# Patient Record
Sex: Male | Born: 1980
Health system: Southern US, Community
[De-identification: ages and names within clinical notes are randomized; demographics above are authoritative.]

## PROBLEM LIST (undated history)

## (undated) DIAGNOSIS — K589 Irritable bowel syndrome without diarrhea: Secondary | ICD-10-CM

## (undated) DIAGNOSIS — E781 Pure hyperglyceridemia: Secondary | ICD-10-CM

## (undated) DIAGNOSIS — E782 Mixed hyperlipidemia: Secondary | ICD-10-CM

## (undated) DIAGNOSIS — R42 Dizziness and giddiness: Secondary | ICD-10-CM

## (undated) DIAGNOSIS — K219 Gastro-esophageal reflux disease without esophagitis: Secondary | ICD-10-CM

## (undated) DIAGNOSIS — Z91018 Allergy to other foods: Secondary | ICD-10-CM

## (undated) DIAGNOSIS — F319 Bipolar disorder, unspecified: Secondary | ICD-10-CM

## (undated) DIAGNOSIS — M545 Low back pain, unspecified: Secondary | ICD-10-CM

## (undated) DIAGNOSIS — F419 Anxiety disorder, unspecified: Secondary | ICD-10-CM

## (undated) DIAGNOSIS — F909 Attention-deficit hyperactivity disorder, unspecified type: Secondary | ICD-10-CM

## (undated) DIAGNOSIS — M5412 Radiculopathy, cervical region: Secondary | ICD-10-CM

## (undated) DIAGNOSIS — K760 Fatty (change of) liver, not elsewhere classified: Secondary | ICD-10-CM

## (undated) DIAGNOSIS — Z91040 Latex allergy status: Secondary | ICD-10-CM

## (undated) DIAGNOSIS — R251 Tremor, unspecified: Secondary | ICD-10-CM

## (undated) DIAGNOSIS — F329 Major depressive disorder, single episode, unspecified: Secondary | ICD-10-CM

## (undated) DIAGNOSIS — G4489 Other headache syndrome: Secondary | ICD-10-CM

## (undated) DIAGNOSIS — I10 Essential (primary) hypertension: Secondary | ICD-10-CM

## (undated) DIAGNOSIS — G5603 Carpal tunnel syndrome, bilateral upper limbs: Secondary | ICD-10-CM

## (undated) DIAGNOSIS — R51 Headache: Secondary | ICD-10-CM

## (undated) HISTORY — DX: Attention-deficit hyperactivity disorder, unspecified type: F90.9

## (undated) HISTORY — DX: Bipolar disorder, unspecified: F31.9

## (undated) HISTORY — DX: Latex allergy status: Z91.040

## (undated) HISTORY — PX: WISDOM TOOTH EXTRACTION: SHX21

## (undated) HISTORY — DX: Other headache syndrome: G44.89

## (undated) HISTORY — DX: Allergy to other foods: Z91.018

## (undated) HISTORY — DX: Fatty (change of) liver, not elsewhere classified: K76.0

## (undated) HISTORY — DX: Mixed hyperlipidemia: E78.2

## (undated) HISTORY — DX: Low back pain: M54.5

## (undated) HISTORY — DX: Tremor, unspecified: R25.1

## (undated) HISTORY — DX: Anxiety disorder, unspecified: F41.9

## (undated) HISTORY — DX: Dizziness and giddiness: R42

## (undated) HISTORY — DX: Gastro-esophageal reflux disease without esophagitis: K21.9

## (undated) HISTORY — PX: CARPAL TUNNEL RELEASE: SHX101

## (undated) HISTORY — DX: Carpal tunnel syndrome, bilateral upper limbs: G56.03

## (undated) HISTORY — DX: Low back pain, unspecified: M54.50

## (undated) HISTORY — DX: Irritable bowel syndrome, unspecified: K58.9

## (undated) HISTORY — PX: KNEE SURGERY: SHX244

## (undated) HISTORY — DX: Pure hyperglyceridemia: E78.1

---

## 2001-09-22 ENCOUNTER — Ambulatory Visit (HOSPITAL_COMMUNITY): Admission: RE | Admit: 2001-09-22 | Discharge: 2001-09-22 | Payer: Self-pay | Admitting: Pulmonary Disease

## 2002-10-01 ENCOUNTER — Other Ambulatory Visit: Admission: RE | Admit: 2002-10-01 | Discharge: 2002-10-01 | Payer: Self-pay | Admitting: Unknown Physician Specialty

## 2003-03-01 ENCOUNTER — Emergency Department (HOSPITAL_COMMUNITY): Admission: EM | Admit: 2003-03-01 | Discharge: 2003-03-01 | Payer: Self-pay | Admitting: Emergency Medicine

## 2003-03-22 ENCOUNTER — Emergency Department (HOSPITAL_COMMUNITY): Admission: EM | Admit: 2003-03-22 | Discharge: 2003-03-22 | Payer: Self-pay | Admitting: Family Medicine

## 2003-04-06 ENCOUNTER — Emergency Department (HOSPITAL_COMMUNITY): Admission: EM | Admit: 2003-04-06 | Discharge: 2003-04-06 | Payer: Self-pay | Admitting: Emergency Medicine

## 2003-04-12 ENCOUNTER — Emergency Department (HOSPITAL_COMMUNITY): Admission: EM | Admit: 2003-04-12 | Discharge: 2003-04-12 | Payer: Self-pay | Admitting: Emergency Medicine

## 2004-04-16 ENCOUNTER — Emergency Department (HOSPITAL_COMMUNITY): Admission: EM | Admit: 2004-04-16 | Discharge: 2004-04-16 | Payer: Self-pay | Admitting: Emergency Medicine

## 2005-01-25 ENCOUNTER — Emergency Department (HOSPITAL_COMMUNITY): Admission: EM | Admit: 2005-01-25 | Discharge: 2005-01-26 | Payer: Self-pay | Admitting: Emergency Medicine

## 2006-12-05 ENCOUNTER — Emergency Department (HOSPITAL_COMMUNITY): Admission: EM | Admit: 2006-12-05 | Discharge: 2006-12-05 | Payer: Self-pay | Admitting: Emergency Medicine

## 2006-12-22 ENCOUNTER — Emergency Department (HOSPITAL_COMMUNITY): Admission: EM | Admit: 2006-12-22 | Discharge: 2006-12-22 | Payer: Self-pay | Admitting: Emergency Medicine

## 2007-01-02 ENCOUNTER — Emergency Department (HOSPITAL_COMMUNITY): Admission: EM | Admit: 2007-01-02 | Discharge: 2007-01-02 | Payer: Self-pay | Admitting: Emergency Medicine

## 2007-01-26 ENCOUNTER — Emergency Department (HOSPITAL_COMMUNITY): Admission: EM | Admit: 2007-01-26 | Discharge: 2007-01-26 | Payer: Self-pay | Admitting: Emergency Medicine

## 2007-03-09 ENCOUNTER — Emergency Department (HOSPITAL_COMMUNITY): Admission: EM | Admit: 2007-03-09 | Discharge: 2007-03-09 | Payer: Self-pay | Admitting: Emergency Medicine

## 2007-03-18 ENCOUNTER — Ambulatory Visit (HOSPITAL_COMMUNITY): Admission: RE | Admit: 2007-03-18 | Discharge: 2007-03-18 | Payer: Self-pay | Admitting: Orthopaedic Surgery

## 2007-05-10 ENCOUNTER — Emergency Department (HOSPITAL_COMMUNITY): Admission: EM | Admit: 2007-05-10 | Discharge: 2007-05-10 | Payer: Self-pay | Admitting: Emergency Medicine

## 2010-11-07 LAB — URINALYSIS, ROUTINE W REFLEX MICROSCOPIC: Urobilinogen, UA: 0.2

## 2013-05-13 ENCOUNTER — Emergency Department (HOSPITAL_COMMUNITY)
Admission: EM | Admit: 2013-05-13 | Discharge: 2013-05-14 | Disposition: A | Discharge status: Home / Self Care | Payer: Self-pay | Attending: Emergency Medicine | Admitting: Emergency Medicine

## 2013-05-13 ENCOUNTER — Encounter (HOSPITAL_COMMUNITY): Payer: Self-pay | Admitting: Emergency Medicine

## 2013-05-13 DIAGNOSIS — N453 Epididymo-orchitis: Secondary | ICD-10-CM | POA: Insufficient documentation

## 2013-05-13 DIAGNOSIS — Z87440 Personal history of urinary (tract) infections: Secondary | ICD-10-CM | POA: Insufficient documentation

## 2013-05-13 DIAGNOSIS — N451 Epididymitis: Secondary | ICD-10-CM

## 2013-05-13 DIAGNOSIS — Z8619 Personal history of other infectious and parasitic diseases: Secondary | ICD-10-CM | POA: Insufficient documentation

## 2013-05-13 DIAGNOSIS — I10 Essential (primary) hypertension: Secondary | ICD-10-CM | POA: Insufficient documentation

## 2013-05-13 HISTORY — DX: Essential (primary) hypertension: I10

## 2013-05-13 NOTE — ED Provider Notes (Signed)
CSN: [id number]     Arrival date & time 05/13/13  2020 History  This chart was scribed for Vida RollerBrian D Teiara Baria, MD by Danella Maiersaroline Early, ED Scribe. This patient was seen in room APA04/APA04 and the patient's care was started at 11:26 PM.   Chief Complaint  Patient presents with  . Groin Pain   The history is provided by the patient. No language interpreter was used.   HPI Comments: Bobby Jimenez is a 33 y.o. male who presents to the Emergency Department complaining of constant groin pain onset 5 days ago. He states the pain is worse with palpation. He states the pain radiates into his abdomen and back. He reports throbbing pain in his penis with urination. He has a h/o 2 UTIs. He has a h/o STD but does not know which one. He is not having unprotected sex.    Past Medical History  Diagnosis Date  . Hypertension    Past Surgical History  Procedure Laterality Date  . Wisdom tooth extraction    . Knee surgery     History reviewed. No pertinent family history. History  Substance Use Topics  . Smoking status: Never Smoker   . Smokeless tobacco: Not on file  . Alcohol Use: Yes    Review of Systems  Constitutional: Negative for fever and chills.  Genitourinary: Positive for dysuria.  Skin: Negative for rash.  Neurological: Negative for numbness.  All other systems reviewed and are negative.     Allergies  Review of patient's allergies indicates no known allergies.  Home Medications   Prior to Admission medications   Not on File   BP 135/83  Pulse 101  Temp(Src) 98.2 F (36.8 C) (Oral)  Resp 18  Ht 5\' 10"  (1.778 m)  Wt 225 lb (102.059 kg)  BMI 32.28 kg/m2  SpO2 100% Physical Exam  Nursing note and vitals reviewed. Constitutional: He appears well-developed and well-nourished. No distress.  HENT:  Head: Normocephalic and atraumatic.  Eyes: Conjunctivae are normal. Right eye exhibits no discharge. Left eye exhibits no discharge.  Cardiovascular: Normal rate and regular  rhythm.   No murmur heard. Pulmonary/Chest: Effort normal and breath sounds normal.  Abdominal: Soft. He exhibits no distension. There is no tenderness. There is no rebound and no guarding.  Genitourinary:  nontender prostate normal rectal exam. Chaperone present.  Normal appearing scrotum, L testicle with posterior ttp, no masses  Musculoskeletal: He exhibits no edema and no tenderness.  Lymphadenopathy:    He has no cervical adenopathy.  Neurological: He is alert. Coordination normal.  Skin: Skin is warm. No rash noted.    ED Course  Procedures (including critical care time) Medications  cefTRIAXone (ROCEPHIN) injection 250 mg (not administered)    DIAGNOSTIC STUDIES: Oxygen Saturation is 100% on RA, normal by my interpretation.    COORDINATION OF CARE: 11:42 PM- Discussed treatment plan with pt which includes STD swab. Pt agrees to plan.    Labs Review Labs Reviewed  GC/CHLAMYDIA PROBE AMP  URINALYSIS, ROUTINE W REFLEX MICROSCOPIC  RPR  HIV ANTIBODY (ROUTINE TESTING)    Imaging Review No results found.    MDM   Final diagnoses:  Epididymitis    Exam is consistent with possible epididymitis, no signs of prostatitis, urinalysis without infection, GC chlamydia pending, patient informed of treatment plan and is in agreement. Will followup outpatient, medications as below   Meds given in ED:  Medications  cefTRIAXone (ROCEPHIN) injection 250 mg (not administered)    New Prescriptions  DOXYCYCLINE (VIBRAMYCIN) 100 MG CAPSULE    Take 1 capsule (100 mg total) by mouth 2 (two) times daily.   NAPROXEN (NAPROSYN) 500 MG TABLET    Take 1 tablet (500 mg total) by mouth 2 (two) times daily with a meal.      I personally performed the services described in this documentation, which was scribed in my presence. The recorded information has been reviewed and is accurate.      Vida RollerBrian D Anneth Brunell, MD 05/14/13 [phone]

## 2013-05-13 NOTE — ED Notes (Addendum)
Pt c/o groin pain (there is a "spot" on his testicle). Pt states he has an infected hair underneath testicle. Pt states the pain is radiating down his knees and into his feet. Pt also c/o back pain.

## 2013-05-14 LAB — URINALYSIS, ROUTINE W REFLEX MICROSCOPIC
Bilirubin Urine: NEGATIVE
GLUCOSE, UA: NEGATIVE mg/dL
HGB URINE DIPSTICK: NEGATIVE
Ketones, ur: NEGATIVE mg/dL
Leukocytes, UA: NEGATIVE
Nitrite: NEGATIVE
PROTEIN: NEGATIVE mg/dL
SPECIFIC GRAVITY, URINE: 1.015 (ref 1.005–1.030)
UROBILINOGEN UA: 0.2 mg/dL (ref 0.0–1.0)
pH: 6 (ref 5.0–8.0)

## 2013-05-14 LAB — RPR

## 2013-05-14 LAB — HIV ANTIBODY (ROUTINE TESTING W REFLEX): HIV 1&2 Ab, 4th Generation: NONREACTIVE

## 2013-05-14 MED ORDER — CEFTRIAXONE SODIUM 250 MG IJ SOLR
250.0000 mg | Freq: Once | INTRAMUSCULAR | Status: AC
  Administered 2013-05-14: 250 mg via INTRAMUSCULAR
  Filled 2013-05-14: qty 250

## 2013-05-14 MED ORDER — LIDOCAINE HCL (PF) 1 % IJ SOLN
INTRAMUSCULAR | Status: DC
Start: 2013-05-14 — End: 2013-05-14
  Filled 2013-05-14: qty 5

## 2013-05-14 MED ORDER — NAPROXEN 500 MG PO TABS: 500.0000 mg | ORAL_TABLET | Freq: Two times a day (BID) | ORAL | Status: DC

## 2013-05-14 MED ORDER — DOXYCYCLINE HYCLATE 100 MG PO CAPS: 100.0000 mg | ORAL_CAPSULE | Freq: Two times a day (BID) | ORAL | Status: DC

## 2013-05-14 NOTE — ED Notes (Signed)
Discharge instructions and prescriptions given and reviewed with patient.  Patient verbalized understanding to complete all antibiotic and take medications as directed.  Patient ambulatory; discharged home in good condition.

## 2013-05-14 NOTE — Discharge Instructions (Signed)
If your tests turn positive for STD, you will receive a phone call - you likely have epididymitis - read attached instructions, take medicine exactly as prescribed, return to the ER for worsening symptoms.  Please call your doctor for a followup appointment within 24-48 hours. When you talk to your doctor please let them know that you were seen in the emergency department and have them acquire all of your records so that they can discuss the findings with you and formulate a treatment plan to fully care for your new and ongoing problems.  [facility]    Kari BaarsEdward Hawkins MD. Specialty: Pulmonary Disease Contact information: [address]  [address]  [place] [address]  [phone]   Syliva OvermanMargaret Simpson, MD. Specialty: [facility] Contact information: [address]  [place] [address]  [phone]   Lilyan PuntScott Luking, MD. Specialty: [facility] Contact information: [address]  [address]  [place] [address]  [phone]   Avon Gullyesfaye Fanta, MD Specialty: Internal Medicine Contact information: [address]  [place] [address]  [phone]   Catalina PizzaZach Hall, MD. Specialty: Internal Medicine Contact information: [address]  [place] [address]  [phone]   Butch PennyAngus Mcinnis, MD. Specialty: Family Medicine Contact information: [address]  [place] [address]  [phone]   John GiovanniStephen Knowlton, MD. Specialty: [facility] Contact information: [address]  [address]  [place] [address]  [phone]   Carylon Perchesoy Fagan, MD. Specialty: Internal Medicine Contact information: [address]  [address]  [place] [address]  [phone]

## 2013-05-15 LAB — GC/CHLAMYDIA PROBE AMP
CT Probe RNA: NEGATIVE
GC Probe RNA: NEGATIVE

## 2013-07-28 ENCOUNTER — Emergency Department (HOSPITAL_COMMUNITY)
Admission: EM | Admit: 2013-07-28 | Discharge: 2013-07-28 | Disposition: A | Discharge status: Home / Self Care | Payer: Self-pay | Attending: Emergency Medicine | Admitting: Emergency Medicine

## 2013-07-28 ENCOUNTER — Encounter (HOSPITAL_COMMUNITY): Payer: Self-pay | Admitting: Emergency Medicine

## 2013-07-28 DIAGNOSIS — Z79899 Other long term (current) drug therapy: Secondary | ICD-10-CM | POA: Insufficient documentation

## 2013-07-28 DIAGNOSIS — M5412 Radiculopathy, cervical region: Secondary | ICD-10-CM | POA: Insufficient documentation

## 2013-07-28 DIAGNOSIS — Z791 Long term (current) use of non-steroidal anti-inflammatories (NSAID): Secondary | ICD-10-CM | POA: Insufficient documentation

## 2013-07-28 DIAGNOSIS — I10 Essential (primary) hypertension: Secondary | ICD-10-CM | POA: Insufficient documentation

## 2013-07-28 DIAGNOSIS — Z792 Long term (current) use of antibiotics: Secondary | ICD-10-CM | POA: Insufficient documentation

## 2013-07-28 DIAGNOSIS — F17200 Nicotine dependence, unspecified, uncomplicated: Secondary | ICD-10-CM | POA: Insufficient documentation

## 2013-07-28 MED ORDER — OXYCODONE-ACETAMINOPHEN 5-325 MG PO TABS: 1.0000 | ORAL_TABLET | ORAL | Status: DC | PRN

## 2013-07-28 MED ORDER — OXYCODONE-ACETAMINOPHEN 5-325 MG PO TABS
2.0000 | ORAL_TABLET | Freq: Once | ORAL | Status: AC
  Administered 2013-07-28: 2 via ORAL
  Filled 2013-07-28: qty 2

## 2013-07-28 MED ORDER — IBUPROFEN 600 MG PO TABS: 600.0000 mg | ORAL_TABLET | Freq: Four times a day (QID) | ORAL | Status: DC | PRN

## 2013-07-28 MED ORDER — IBUPROFEN 400 MG PO TABS
600.0000 mg | ORAL_TABLET | Freq: Once | ORAL | Status: AC
  Administered 2013-07-28: 600 mg via ORAL
  Filled 2013-07-28: qty 2

## 2013-07-28 MED ORDER — PREDNISONE 20 MG PO TABS: 40.0000 mg | ORAL_TABLET | Freq: Every day | ORAL | Status: DC

## 2013-07-28 MED ORDER — PREDNISONE 20 MG PO TABS
40.0000 mg | ORAL_TABLET | Freq: Once | ORAL | Status: AC
  Administered 2013-07-28: 40 mg via ORAL
  Filled 2013-07-28: qty 2

## 2013-07-28 MED ORDER — CYCLOBENZAPRINE HCL 10 MG PO TABS: 10.0000 mg | ORAL_TABLET | Freq: Three times a day (TID) | ORAL | Status: DC | PRN

## 2013-07-28 NOTE — ED Notes (Signed)
Pt reporting pain in left shoulder starting yesterday.  Reports decreased ability to move left arm.  Pt states that he has had this problem before but has never been treated for it.

## 2013-07-28 NOTE — Discharge Instructions (Signed)

## 2013-07-28 NOTE — ED Provider Notes (Signed)
CSN: [id number]     Arrival date & time 07/28/13  0526 History   First MD Initiated Contact with Patient 07/28/13 [phone]     Chief Complaint  Patient presents with  . Shoulder Pain     (Consider location/radiation/quality/duration/timing/severity/associated sxs/prior Treatment) HPI  33 year old male with left shoulder pain. Onset yesterday and simply increase when he woke up today. Denies any acute trauma. Does do manual labor. No numbness or tingling. Pain is worsened with range of motion of his left shoulder. No fevers or chills. No rash. No loss of strength. Has had similar pain previously, but not quite to this degree. No intervention prior to arrival.  Past Medical History  Diagnosis Date  . Hypertension    Past Surgical History  Procedure Laterality Date  . Wisdom tooth extraction    . Knee surgery     History reviewed. No pertinent family history. History  Substance Use Topics  . Smoking status: Current Some Day Smoker  . Smokeless tobacco: Not on file  . Alcohol Use: Yes     Comment: occasional    Review of Systems  All systems reviewed and negative, other than as noted in HPI.   Allergies  Review of patient's allergies indicates no known allergies.  Home Medications   Prior to Admission medications   Medication Sig Start Date End Date Taking? Authorizing Provider  busPIRone (BUSPAR) 5 MG tablet Take 5 mg by mouth 2 (two) times daily.    Historical Provider, MD  doxycycline (VIBRAMYCIN) 100 MG capsule Take 1 capsule (100 mg total) by mouth 2 (two) times daily. 05/14/13   Vida RollerBrian D Miller, MD  lisinopril (PRINIVIL,ZESTRIL) 10 MG tablet Take 10 mg by mouth daily.    Historical Provider, MD  Multiple Vitamin (MULTIVITAMIN) tablet Take 1 tablet by mouth daily.    Historical Provider, MD  naproxen (NAPROSYN) 500 MG tablet Take 1 tablet (500 mg total) by mouth 2 (two) times daily with a meal. 05/14/13   Vida RollerBrian D Miller, MD  ranitidine (ZANTAC) 150 MG capsule Take 150 mg by  mouth daily.    Historical Provider, MD  saw palmetto 500 MG capsule Take 450 mg by mouth daily.    Historical Provider, MD   BP 124/84  Pulse 95  Temp(Src) 98.3 F (36.8 C) (Oral)  Resp 20  Ht 5\' 10"  (1.778 m)  Wt 225 lb (102.059 kg)  BMI 32.28 kg/m2  SpO2 97% Physical Exam  Nursing note and vitals reviewed. Constitutional: He appears well-developed and well-nourished. No distress.  HENT:  Head: Normocephalic and atraumatic.  Eyes: Conjunctivae are normal. Right eye exhibits no discharge. Left eye exhibits no discharge.  Neck: Neck supple.  Cardiovascular: Normal rate, regular rhythm and normal heart sounds.  Exam reveals no gallop and no friction rub.   No murmur heard. Pulmonary/Chest: Effort normal and breath sounds normal. No respiratory distress.  Abdominal: Soft. He exhibits no distension. There is no tenderness.  Musculoskeletal: He exhibits no edema and no tenderness.  Mild tenderness over left lateral neck/left trapezius. No concerning skin changes. Increased pain with neck and shoulder movement. Neurovascular intact distally.  Neurological: He is alert.  Skin: Skin is warm and dry.  Psychiatric: He has a normal mood and affect. His behavior is normal. Thought content normal.    ED Course  Procedures (including critical care time) Labs Review Labs Reviewed - No data to display  Imaging Review No results found.   EKG Interpretation None      MDM  Final diagnoses:  Cervical radiculopathy    33 year old male with pain consistent with a cervical radiculopathy. Strength is normal. Sensation is intact to light touch. Palpable DP pulse. I plan symptomatic treatment.    Raeford RazorStephen Kohut, MD 07/29/13 1229

## 2013-07-29 ENCOUNTER — Ambulatory Visit (HOSPITAL_COMMUNITY)
Admission: RE | Admit: 2013-07-29 | Discharge: 2013-07-29 | Disposition: A | Discharge status: Home / Self Care | Payer: Self-pay | Source: Ambulatory Visit | Attending: Pulmonary Disease | Admitting: Pulmonary Disease

## 2013-07-29 ENCOUNTER — Other Ambulatory Visit (HOSPITAL_COMMUNITY): Payer: Self-pay | Admitting: Pulmonary Disease

## 2013-07-29 DIAGNOSIS — M542 Cervicalgia: Secondary | ICD-10-CM

## 2013-07-29 DIAGNOSIS — M79609 Pain in unspecified limb: Secondary | ICD-10-CM | POA: Insufficient documentation

## 2013-11-16 ENCOUNTER — Encounter (HOSPITAL_COMMUNITY): Payer: Self-pay | Admitting: Emergency Medicine

## 2013-11-16 ENCOUNTER — Emergency Department (HOSPITAL_COMMUNITY): Payer: Self-pay

## 2013-11-16 ENCOUNTER — Emergency Department (HOSPITAL_COMMUNITY)
Admission: EM | Admit: 2013-11-16 | Discharge: 2013-11-16 | Disposition: A | Discharge status: Home / Self Care | Payer: Self-pay | Attending: Emergency Medicine | Admitting: Emergency Medicine

## 2013-11-16 DIAGNOSIS — I10 Essential (primary) hypertension: Secondary | ICD-10-CM | POA: Insufficient documentation

## 2013-11-16 DIAGNOSIS — Z87891 Personal history of nicotine dependence: Secondary | ICD-10-CM | POA: Insufficient documentation

## 2013-11-16 DIAGNOSIS — Z79899 Other long term (current) drug therapy: Secondary | ICD-10-CM | POA: Insufficient documentation

## 2013-11-16 DIAGNOSIS — F329 Major depressive disorder, single episode, unspecified: Secondary | ICD-10-CM | POA: Insufficient documentation

## 2013-11-16 DIAGNOSIS — R1084 Generalized abdominal pain: Secondary | ICD-10-CM | POA: Insufficient documentation

## 2013-11-16 HISTORY — DX: Major depressive disorder, single episode, unspecified: F32.9

## 2013-11-16 LAB — CBC WITH DIFFERENTIAL/PLATELET
Basophils Absolute: 0 10*3/uL (ref 0.0–0.1)
Basophils Relative: 0 % (ref 0–1)
EOS ABS: 0.1 10*3/uL (ref 0.0–0.7)
Eosinophils Relative: 1 % (ref 0–5)
HCT: 42.1 % (ref 39.0–52.0)
Hemoglobin: 14.6 g/dL (ref 13.0–17.0)
LYMPHS ABS: 2 10*3/uL (ref 0.7–4.0)
Lymphocytes Relative: 21 % (ref 12–46)
MCH: 30.4 pg (ref 26.0–34.0)
MCHC: 34.7 g/dL (ref 30.0–36.0)
MCV: 87.5 fL (ref 78.0–100.0)
MONOS PCT: 7 % (ref 3–12)
Monocytes Absolute: 0.6 10*3/uL (ref 0.1–1.0)
NEUTROS ABS: 6.7 10*3/uL (ref 1.7–7.7)
NEUTROS PCT: 71 % (ref 43–77)
Platelets: 294 10*3/uL (ref 150–400)
RBC: 4.81 MIL/uL (ref 4.22–5.81)
RDW: 13 % (ref 11.5–15.5)
WBC: 9.3 10*3/uL (ref 4.0–10.5)

## 2013-11-16 LAB — URINALYSIS, ROUTINE W REFLEX MICROSCOPIC
BILIRUBIN URINE: NEGATIVE
GLUCOSE, UA: NEGATIVE mg/dL
HGB URINE DIPSTICK: NEGATIVE
Ketones, ur: NEGATIVE mg/dL
LEUKOCYTES UA: NEGATIVE
Nitrite: NEGATIVE
PROTEIN: NEGATIVE mg/dL
Specific Gravity, Urine: 1.025 (ref 1.005–1.030)
UROBILINOGEN UA: 0.2 mg/dL (ref 0.0–1.0)
pH: 6.5 (ref 5.0–8.0)

## 2013-11-16 LAB — BASIC METABOLIC PANEL
ANION GAP: 13 (ref 5–15)
BUN: 9 mg/dL (ref 6–23)
CALCIUM: 9.7 mg/dL (ref 8.4–10.5)
CHLORIDE: 101 meq/L (ref 96–112)
CO2: 26 mEq/L (ref 19–32)
CREATININE: 0.77 mg/dL (ref 0.50–1.35)
GFR calc Af Amer: >90 mL/min (ref >90)
GFR calc non Af Amer: >90 mL/min (ref >90)
Glucose, Bld: 110 mg/dL — ABNORMAL HIGH (ref 70–99)
Potassium: 4 mEq/L (ref 3.7–5.3)
SODIUM: 140 meq/L (ref 137–147)

## 2013-11-16 MED ORDER — IOHEXOL 300 MG/ML  SOLN
25.0000 mL | Freq: Once | INTRAMUSCULAR | Status: AC | PRN
  Administered 2013-11-16: 25 mL via ORAL

## 2013-11-16 MED ORDER — ONDANSETRON 4 MG PO TBDP
4.0000 mg | ORAL_TABLET | Freq: Once | ORAL | Status: AC
  Administered 2013-11-16: 4 mg via ORAL
  Filled 2013-11-16: qty 1

## 2013-11-16 MED ORDER — TRAMADOL HCL 50 MG PO TABS
50.0000 mg | ORAL_TABLET | Freq: Four times a day (QID) | ORAL | Status: DC | PRN
Start: 2013-11-16 — End: 2013-12-23

## 2013-11-16 MED ORDER — HYDROCODONE-ACETAMINOPHEN 5-325 MG PO TABS
2.0000 | ORAL_TABLET | Freq: Once | ORAL | Status: AC
  Administered 2013-11-16: 2 via ORAL
  Filled 2013-11-16: qty 2

## 2013-11-16 MED ORDER — IOHEXOL 300 MG/ML  SOLN
100.0000 mL | Freq: Once | INTRAMUSCULAR | Status: AC | PRN
  Administered 2013-11-16: 100 mL via INTRAVENOUS

## 2013-11-16 NOTE — ED Notes (Signed)
Patient complaining of abdominal pain x 3 days with vomiting.

## 2013-11-16 NOTE — ED Notes (Signed)
Pt alert & oriented x4, stable gait. Patient given discharge instructions, paperwork & prescription(s). Patient  instructed to stop at the registration desk to finish any additional paperwork. Patient verbalized understanding. Pt left department w/ no further questions. 

## 2013-11-16 NOTE — Discharge Instructions (Signed)
Your tests including your CT scan and blood work were normal - no signs of urine infection  Please call your doctor for a followup appointment within 24-48 hours. When you talk to your doctor please let them know that you were seen in the emergency department and have them acquire all of your records so that they can discuss the findings with you and formulate a treatment plan to fully care for your new and ongoing problems.   Abdominal Pain Many things can cause abdominal pain. Usually, abdominal pain is not caused by a disease and will improve without treatment. It can often be observed and treated at home. Your health care provider will do a physical exam and possibly order blood tests and X-rays to help determine the seriousness of your pain. However, in many cases, more time must pass before a clear cause of the pain can be found. Before that point, your health care provider may not know if you need more testing or further treatment. HOME CARE INSTRUCTIONS  Monitor your abdominal pain for any changes. The following actions may help to alleviate any discomfort you are experiencing:  Only take over-the-counter or prescription medicines as directed by your health care provider.  Do not take laxatives unless directed to do so by your health care provider.  Try a clear liquid diet (broth, tea, or water) as directed by your health care provider. Slowly move to a bland diet as tolerated. SEEK MEDICAL CARE IF:  You have unexplained abdominal pain.  You have abdominal pain associated with nausea or diarrhea.  You have pain when you urinate or have a bowel movement.  You experience abdominal pain that wakes you in the night.  You have abdominal pain that is worsened or improved by eating food.  You have abdominal pain that is worsened with eating fatty foods.  You have a fever. SEEK IMMEDIATE MEDICAL CARE IF:   Your pain does not go away within 2 hours.  You keep throwing up  (vomiting).  Your pain is felt only in portions of the abdomen, such as the right side or the left lower portion of the abdomen.  You pass bloody or black tarry stools. MAKE SURE YOU:  Understand these instructions.   Will watch your condition.   Will get help right away if you are not doing well or get worse.  Document Released: 10/25/2004 Document Revised: 01/20/2013 Document Reviewed: 09/24/2012 [facility] Patient Information 2015 [place] [place]. This information is not intended to replace advice given to you by your health care provider. Make sure you discuss any questions you have with your health care provider.

## 2013-11-16 NOTE — ED Provider Notes (Signed)
CSN: [id number]     Arrival date & time 11/16/13  1539 History   First MD Initiated Contact with Patient 11/16/13 1905     Chief Complaint  Patient presents with  . Abdominal Pain     (Consider location/radiation/quality/duration/timing/severity/associated sxs/prior Treatment) HPI Comments: The patient is a 33 year old male with a history of high blood pressure and depression who complains of abdominal pain started 3 days ago. There has been some associated vomiting and some significant dysuria. He states that he has had no problems with his bowel movements including no constipation, diarrhea or rectal bleeding. He does endorse having difficulty urinating including maintaining a stream, states that he is unable to empty his bladder, in causes pain when he urinates and his urine has been dark yellow. He denies any back pain, chest pain, shortness of breath, fevers, chills, swelling, rashes, numbness, weakness. No medications given prior to arrival.  Patient is a 33 y.o. male presenting with abdominal pain. The history is provided by the patient.  Abdominal Pain   Past Medical History  Diagnosis Date  . Hypertension   . Depression    Past Surgical History  Procedure Laterality Date  . Wisdom tooth extraction    . Knee surgery     History reviewed. No pertinent family history. History  Substance Use Topics  . Smoking status: Former Games developermoker  . Smokeless tobacco: Not on file  . Alcohol Use: Yes     Comment: occasional    Review of Systems  Gastrointestinal: Positive for abdominal pain.  All other systems reviewed and are negative.     Allergies  Review of patient's allergies indicates no known allergies.  Home Medications   Prior to Admission medications   Medication Sig Start Date End Date Taking? Authorizing Provider  lisinopril (PRINIVIL,ZESTRIL) 10 MG tablet Take 10 mg by mouth daily.   Yes Historical Provider, MD  ranitidine (ZANTAC) 150 MG capsule Take 150 mg by  mouth daily.   Yes Historical Provider, MD  sertraline (ZOLOFT) 50 MG tablet Take 50 mg by mouth daily.   Yes Historical Provider, MD  traMADol (ULTRAM) 50 MG tablet Take 1 tablet (50 mg total) by mouth every 6 (six) hours as needed. 11/16/13   Vida RollerBrian D Sayyid Harewood, MD   BP 133/88  Pulse 110  Temp(Src) 98.9 F (37.2 C) (Oral)  Resp 20  Ht 5\' 10"  (1.778 m)  Wt 221 lb (100.245 kg)  BMI 31.71 kg/m2  SpO2 98% Physical Exam  Nursing note and vitals reviewed. Constitutional: He appears well-developed and well-nourished. No distress.  HENT:  Head: Normocephalic and atraumatic.  Mouth/Throat: Oropharynx is clear and moist. No oropharyngeal exudate.  Eyes: Conjunctivae and EOM are normal. Pupils are equal, round, and reactive to light. Right eye exhibits no discharge. Left eye exhibits no discharge. No scleral icterus.  Neck: Normal range of motion. Neck supple. No JVD present. No thyromegaly present.  Cardiovascular: Normal rate, regular rhythm, normal heart sounds and intact distal pulses.  Exam reveals no gallop and no friction rub.   No murmur heard. Pulmonary/Chest: Effort normal and breath sounds normal. No respiratory distress. He has no wheezes. He has no rales.  Abdominal: Soft. Bowel sounds are normal. He exhibits no distension and no mass. There is tenderness ( Diffuse abdominal tenderness to palpation).  No guarding, no peritoneal signs, very soft, normal bowel sounds  Musculoskeletal: Normal range of motion. He exhibits no edema and no tenderness.  Lymphadenopathy:    He has no cervical adenopathy.  Neurological: He is alert. Coordination normal.  Skin: Skin is warm and dry. No rash noted. No erythema.  Psychiatric: He has a normal mood and affect. His behavior is normal.    ED Course  Procedures (including critical care time) Labs Review Labs Reviewed  BASIC METABOLIC PANEL - Abnormal; Notable for the following:    Glucose, Bld 110 (*)    All other components within normal  limits  CBC WITH DIFFERENTIAL  URINALYSIS, ROUTINE W REFLEX MICROSCOPIC    Imaging Review Ct Abdomen Pelvis W Contrast  11/16/2013   CLINICAL DATA:  Acute lower abdominal pain.  EXAM: CT ABDOMEN AND PELVIS WITH CONTRAST  TECHNIQUE: Multidetector CT imaging of the abdomen and pelvis was performed using the standard protocol following bolus administration of intravenous contrast.  CONTRAST:  25mL OMNIPAQUE IOHEXOL 300 MG/ML SOLN, 100mL OMNIPAQUE IOHEXOL 300 MG/ML SOLN  COMPARISON:  CT scan of April 11, 2013.  FINDINGS: Bilateral pars defects are seen at L5. Visualized lung bases appear normal.  No gallstones are noted. No focal abnormality is noted in the liver, spleen or pancreas. Adrenal glands and kidneys appear normal. No hydronephrosis or renal obstruction is noted. No renal or ureteral calculi are noted. The appendix appears normal. There is no evidence of bowel obstruction. No abnormal fluid collection is noted. Urinary bladder appears normal. No significant adenopathy is noted.  IMPRESSION: Bilateral pars defects are seen at L5. No other abnormality seen in the abdomen or pelvis.   Electronically Signed   By: Roque LiasJames  Green M.D.   On: 11/16/2013 21:22      MDM   Final diagnoses:  Generalized abdominal pain    The patient has some abdominal tenderness, it is nonfocal, non-surgical abdomen at this time, vital signs without fever, no tachycardia on my exam. His blood counts are normal, check urinalysis, if urinalysis is normal he will need further imaging with CT scan for other source of patient's symptoms.  Pt informed of results including normal blood, urine and xray results - expressed understanding to f/u in next 1-2 days if pain persists.  Meds given in ED:  Medications  HYDROcodone-acetaminophen (NORCO/VICODIN) 5-325 MG per tablet 2 tablet (2 tablets Oral Given 11/16/13 1933)  ondansetron (ZOFRAN-ODT) disintegrating tablet 4 mg (4 mg Oral Given 11/16/13 1932)  iohexol (OMNIPAQUE)  300 MG/ML solution 25 mL (25 mLs Oral Contrast Given 11/16/13 2024)  iohexol (OMNIPAQUE) 300 MG/ML solution 100 mL (100 mLs Intravenous Contrast Given 11/16/13 2046)    New Prescriptions   TRAMADOL (ULTRAM) 50 MG TABLET    Take 1 tablet (50 mg total) by mouth every 6 (six) hours as needed.      Vida RollerBrian D Amiyah Shryock, MD 11/16/13 2141

## 2013-12-21 ENCOUNTER — Emergency Department (HOSPITAL_COMMUNITY): Payer: No Typology Code available for payment source

## 2013-12-21 ENCOUNTER — Encounter (HOSPITAL_COMMUNITY): Payer: Self-pay

## 2013-12-21 ENCOUNTER — Emergency Department (HOSPITAL_COMMUNITY)
Admission: EM | Admit: 2013-12-21 | Discharge: 2013-12-22 | Disposition: A | Discharge status: Home / Self Care | Payer: Self-pay | Attending: Emergency Medicine | Admitting: Emergency Medicine

## 2013-12-21 DIAGNOSIS — Y92410 Unspecified street and highway as the place of occurrence of the external cause: Secondary | ICD-10-CM | POA: Insufficient documentation

## 2013-12-21 DIAGNOSIS — F329 Major depressive disorder, single episode, unspecified: Secondary | ICD-10-CM | POA: Insufficient documentation

## 2013-12-21 DIAGNOSIS — S0990XA Unspecified injury of head, initial encounter: Secondary | ICD-10-CM | POA: Insufficient documentation

## 2013-12-21 DIAGNOSIS — Y9389 Activity, other specified: Secondary | ICD-10-CM | POA: Insufficient documentation

## 2013-12-21 DIAGNOSIS — Y998 Other external cause status: Secondary | ICD-10-CM | POA: Insufficient documentation

## 2013-12-21 DIAGNOSIS — S39012A Strain of muscle, fascia and tendon of lower back, initial encounter: Secondary | ICD-10-CM | POA: Insufficient documentation

## 2013-12-21 DIAGNOSIS — I10 Essential (primary) hypertension: Secondary | ICD-10-CM | POA: Insufficient documentation

## 2013-12-21 DIAGNOSIS — Z79899 Other long term (current) drug therapy: Secondary | ICD-10-CM | POA: Insufficient documentation

## 2013-12-21 DIAGNOSIS — S161XXA Strain of muscle, fascia and tendon at neck level, initial encounter: Secondary | ICD-10-CM | POA: Insufficient documentation

## 2013-12-21 DIAGNOSIS — Z87891 Personal history of nicotine dependence: Secondary | ICD-10-CM | POA: Insufficient documentation

## 2013-12-21 MED ORDER — IBUPROFEN 800 MG PO TABS
800.0000 mg | ORAL_TABLET | Freq: Once | ORAL | Status: AC
  Administered 2013-12-21: 800 mg via ORAL
  Filled 2013-12-21: qty 1

## 2013-12-21 MED ORDER — HYDROCODONE-ACETAMINOPHEN 5-325 MG PO TABS
1.0000 | ORAL_TABLET | Freq: Once | ORAL | Status: AC
  Administered 2013-12-21: 1 via ORAL
  Filled 2013-12-21: qty 1
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
## 2013-12-22 ENCOUNTER — Emergency Department (HOSPITAL_COMMUNITY): Payer: No Typology Code available for payment source

## 2013-12-22 MED ORDER — HYDROCODONE-ACETAMINOPHEN 5-325 MG PO TABS
2.0000 | ORAL_TABLET | Freq: Once | ORAL | Status: AC
  Administered 2013-12-22: 2 via ORAL
  Filled 2013-12-22: qty 2

## 2013-12-22 MED ORDER — IBUPROFEN 600 MG PO TABS: 600.0000 mg | ORAL_TABLET | Freq: Four times a day (QID) | ORAL | Status: DC | PRN

## 2013-12-22 MED ORDER — HYDROCODONE-ACETAMINOPHEN 5-325 MG PO TABS: 1.0000 | ORAL_TABLET | ORAL | Status: DC | PRN
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
## 2013-12-23 ENCOUNTER — Encounter: Payer: Self-pay | Admitting: Neurology

## 2013-12-23 ENCOUNTER — Ambulatory Visit (INDEPENDENT_AMBULATORY_CARE_PROVIDER_SITE_OTHER): Payer: Self-pay | Admitting: Neurology

## 2013-12-23 VITALS — BP 125/78 | HR 128 | Ht 70.0 in | Wt 230.0 lb

## 2013-12-23 DIAGNOSIS — F419 Anxiety disorder, unspecified: Secondary | ICD-10-CM

## 2013-12-23 DIAGNOSIS — R42 Dizziness and giddiness: Secondary | ICD-10-CM

## 2013-12-23 DIAGNOSIS — I10 Essential (primary) hypertension: Secondary | ICD-10-CM | POA: Insufficient documentation

## 2013-12-23 DIAGNOSIS — R404 Transient alteration of awareness: Secondary | ICD-10-CM | POA: Insufficient documentation
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
## 2013-12-29 ENCOUNTER — Ambulatory Visit (INDEPENDENT_AMBULATORY_CARE_PROVIDER_SITE_OTHER): Payer: Self-pay | Admitting: Neurology

## 2013-12-29 DIAGNOSIS — R42 Dizziness and giddiness: Secondary | ICD-10-CM

## 2013-12-29 DIAGNOSIS — R404 Transient alteration of awareness: Secondary | ICD-10-CM

## 2013-12-29 DIAGNOSIS — F419 Anxiety disorder, unspecified: Secondary | ICD-10-CM

## 2014-01-01 NOTE — Procedures (Signed)
   HISTORY: 33 years old male, with passing out episode, syncope versus seizure  TECHNIQUE:  16 channel EEG was performed based on standard 10-16 international system. One channel was dedicated to EKG, which has demonstrates normal sinus rhythm of 96 beats per minutes.  Upon awakening, the posterior background activity was well-developed, in alpha range, 11 Hz, with amplitude of 35 microvoltage, reactive to eye opening and closure.  There was no evidence of epileptiform discharge.  Photic stimulation was performed, which induced a symmetric photic driving.  Hyperventilation was performed, there was no abnormality elicit.  No sleep was achieved.  CONCLUSION: This is a  normal awake EEG.  There is no electrodiagnostic evidence of epileptiform discharge

## 2014-01-06 ENCOUNTER — Telehealth: Payer: Self-pay | Admitting: Neurology

## 2014-01-06 NOTE — Telephone Encounter (Signed)
Patient resulting EEG results.  Please call and advise.

## 2014-01-06 NOTE — Telephone Encounter (Signed)
Annabelle HarmanDana, please call patient, EEG was normal

## 2014-01-07 NOTE — Telephone Encounter (Signed)
Called patient and spoke to him told him normal MRI results. Patient understood.

## 2014-01-12 ENCOUNTER — Ambulatory Visit (HOSPITAL_COMMUNITY)
Admission: RE | Admit: 2014-01-12 | Discharge: 2014-01-12 | Disposition: A | Discharge status: Home / Self Care | Payer: No Typology Code available for payment source | Source: Ambulatory Visit | Attending: Neurology | Admitting: Neurology

## 2014-01-12 DIAGNOSIS — R42 Dizziness and giddiness: Secondary | ICD-10-CM | POA: Insufficient documentation

## 2014-01-12 DIAGNOSIS — R404 Transient alteration of awareness: Secondary | ICD-10-CM

## 2014-01-12 DIAGNOSIS — R455 Hostility: Secondary | ICD-10-CM | POA: Insufficient documentation

## 2014-01-12 DIAGNOSIS — F419 Anxiety disorder, unspecified: Secondary | ICD-10-CM | POA: Insufficient documentation

## 2014-01-12 LAB — CREATININE, SERUM
CREATININE: 0.85 mg/dL (ref 0.50–1.35)
GFR calc Af Amer: >90 mL/min (ref >90)
GFR calc non Af Amer: >90 mL/min (ref >90)

## 2014-01-12 MED ORDER — GADOBENATE DIMEGLUMINE 529 MG/ML IV SOLN
20.0000 mL | Freq: Once | INTRAVENOUS | Status: AC | PRN
  Administered 2014-01-12: 20 mL via INTRAVENOUS

## 2014-01-14 ENCOUNTER — Telehealth: Payer: Self-pay | Admitting: Neurology

## 2014-01-14 NOTE — Telephone Encounter (Signed)
Patient calling for MRI results.

## 2014-01-14 NOTE — Telephone Encounter (Signed)
Annabelle HarmanDana, call patient for normal MRI brain and EEG.

## 2014-01-14 NOTE — Telephone Encounter (Signed)
Patient returning someones call °

## 2014-01-15 NOTE — Telephone Encounter (Signed)
Called and spoke to patient and he still needs to have labs done. Patient will come get labs done before January 21st. Patient understood process.

## 2014-01-15 NOTE — Telephone Encounter (Signed)
Called and spoke to patient told him MRI and EEG was normal he understood.

## 2014-01-18 NOTE — Progress Notes (Signed)
Quick Note:  Called patient and left message normal MRI. ______

## 2014-02-17 ENCOUNTER — Telehealth: Payer: Self-pay | Admitting: Neurology

## 2014-02-17 NOTE — Telephone Encounter (Signed)
Patient has cancelled appointment for 02/18/14, will call back to reschedule, having insurance issues. He is requesting that his recent MRI, EEG results be mailed to him.

## 2014-02-18 ENCOUNTER — Ambulatory Visit: Payer: Self-pay | Admitting: Neurology

## 2014-05-14 ENCOUNTER — Encounter (HOSPITAL_COMMUNITY): Payer: Self-pay | Admitting: *Deleted

## 2014-05-14 ENCOUNTER — Emergency Department (HOSPITAL_COMMUNITY)
Admission: EM | Admit: 2014-05-14 | Discharge: 2014-05-14 | Disposition: A | Discharge status: Home / Self Care | Payer: 59 | Attending: Emergency Medicine | Admitting: Emergency Medicine

## 2014-05-14 DIAGNOSIS — R42 Dizziness and giddiness: Secondary | ICD-10-CM | POA: Diagnosis present

## 2014-05-14 DIAGNOSIS — R51 Headache: Secondary | ICD-10-CM | POA: Diagnosis not present

## 2014-05-14 DIAGNOSIS — R519 Headache, unspecified: Secondary | ICD-10-CM

## 2014-05-14 DIAGNOSIS — Z91040 Latex allergy status: Secondary | ICD-10-CM | POA: Insufficient documentation

## 2014-05-14 DIAGNOSIS — Z79899 Other long term (current) drug therapy: Secondary | ICD-10-CM | POA: Insufficient documentation

## 2014-05-14 DIAGNOSIS — I10 Essential (primary) hypertension: Secondary | ICD-10-CM | POA: Insufficient documentation

## 2014-05-14 DIAGNOSIS — F329 Major depressive disorder, single episode, unspecified: Secondary | ICD-10-CM | POA: Insufficient documentation

## 2014-05-14 DIAGNOSIS — F419 Anxiety disorder, unspecified: Secondary | ICD-10-CM | POA: Diagnosis not present

## 2014-05-14 DIAGNOSIS — Z720 Tobacco use: Secondary | ICD-10-CM | POA: Insufficient documentation

## 2014-05-14 LAB — BASIC METABOLIC PANEL
Anion gap: 11 (ref 5–15)
BUN: 16 mg/dL (ref 6–23)
CALCIUM: 9.6 mg/dL (ref 8.4–10.5)
CO2: 24 mmol/L (ref 19–32)
Chloride: 103 mmol/L (ref 96–112)
Creatinine, Ser: 0.75 mg/dL (ref 0.50–1.35)
GFR calc Af Amer: >90 mL/min (ref >90)
Glucose, Bld: 94 mg/dL (ref 70–99)
Potassium: 3.9 mmol/L (ref 3.5–5.1)
Sodium: 138 mmol/L (ref 135–145)

## 2014-05-14 LAB — URINALYSIS, ROUTINE W REFLEX MICROSCOPIC
BILIRUBIN URINE: NEGATIVE
Glucose, UA: NEGATIVE mg/dL
HGB URINE DIPSTICK: NEGATIVE
KETONES UR: NEGATIVE mg/dL
Leukocytes, UA: NEGATIVE
NITRITE: NEGATIVE
PROTEIN: NEGATIVE mg/dL
Specific Gravity, Urine: 1.023 (ref 1.005–1.030)
UROBILINOGEN UA: 0.2 mg/dL (ref 0.0–1.0)
pH: 7.5 (ref 5.0–8.0)

## 2014-05-14 LAB — CBC WITH DIFFERENTIAL/PLATELET
BASOS ABS: 0 10*3/uL (ref 0.0–0.1)
Basophils Relative: 0 % (ref 0–1)
EOS ABS: 0.2 10*3/uL (ref 0.0–0.7)
EOS PCT: 2 % (ref 0–5)
HEMATOCRIT: 40.4 % (ref 39.0–52.0)
HEMOGLOBIN: 14.5 g/dL (ref 13.0–17.0)
LYMPHS ABS: 1.8 10*3/uL (ref 0.7–4.0)
Lymphocytes Relative: 20 % (ref 12–46)
MCH: 31 pg (ref 26.0–34.0)
MCHC: 35.9 g/dL (ref 30.0–36.0)
MCV: 86.3 fL (ref 78.0–100.0)
Monocytes Absolute: 0.6 10*3/uL (ref 0.1–1.0)
Monocytes Relative: 7 % (ref 3–12)
NEUTROS ABS: 6.1 10*3/uL (ref 1.7–7.7)
Neutrophils Relative %: 71 % (ref 43–77)
Platelets: 236 10*3/uL (ref 150–400)
RBC: 4.68 MIL/uL (ref 4.22–5.81)
RDW: 13.3 % (ref 11.5–15.5)
WBC: 8.7 10*3/uL (ref 4.0–10.5)

## 2014-05-14 MED ORDER — METOCLOPRAMIDE HCL 5 MG/ML IJ SOLN
10.0000 mg | Freq: Once | INTRAMUSCULAR | Status: AC
  Administered 2014-05-14: 10 mg via INTRAVENOUS
  Filled 2014-05-14: qty 2

## 2014-05-14 MED ORDER — KETOROLAC TROMETHAMINE 15 MG/ML IJ SOLN
15.0000 mg | Freq: Once | INTRAMUSCULAR | Status: AC
  Administered 2014-05-14: 15 mg via INTRAVENOUS
  Filled 2014-05-14: qty 1

## 2014-05-14 MED ORDER — DIPHENHYDRAMINE HCL 50 MG/ML IJ SOLN
25.0000 mg | Freq: Once | INTRAMUSCULAR | Status: AC
  Administered 2014-05-14: 25 mg via INTRAVENOUS
  Filled 2014-05-14: qty 1

## 2014-05-14 MED ORDER — PROMETHAZINE HCL 25 MG PO TABS: 25.0000 mg | ORAL_TABLET | Freq: Four times a day (QID) | ORAL | Status: DC | PRN

## 2014-05-14 MED ORDER — SODIUM CHLORIDE 0.9 % IV BOLUS (SEPSIS)
1000.0000 mL | Freq: Once | INTRAVENOUS | Status: AC
Start: 2014-05-14 — End: 2014-05-14
  Administered 2014-05-14: 1000 mL via INTRAVENOUS

## 2014-05-14 NOTE — ED Notes (Signed)
Pt reports waking up this am and "just not feeling well." has multiple complaints, including headache, had nose bleed, dizziness, nausea, blurred vision.

## 2014-05-14 NOTE — ED Provider Notes (Signed)
CSN: [id number]     Arrival date & time 05/14/14  1511 History   First MD Initiated Contact with Patient 05/14/14 1606     Chief Complaint  Patient presents with  . Nausea  . Dizziness    Patient is a 34 y.o. male presenting with dizziness. The history is provided by the patient. No language interpreter was used.  Dizziness  Bobby Jimenez presents for evaluation of headache. He did not feel completely well and is complaining of a gradual onset of headache. He woke up around 11:00 because he was up late last night. He had associated nausea and dry heaves. He denies any fevers, vomiting, chest pain, abdominal pain, numbness or weakness. His significant other thinks he seems a little bit confused for him. He endorses some dizziness and blurred vision. His headache is located over the front and top of his head. He does have some photophobia associated with that. Symptoms are moderate, constant, worsening  Past Medical History  Diagnosis Date  . Hypertension   . Depression   . Anxiety   . Dizziness   . Lightheaded    Past Surgical History  Procedure Laterality Date  . Wisdom tooth extraction    . Knee surgery Right    Family History  Problem Relation Age of Onset  . High blood pressure Mother   . Seizures Mother    History  Substance Use Topics  . Smoking status: Current Every Day Smoker  . Smokeless tobacco: Current User    Types: Chew  . Alcohol Use: 1.2 oz/week    2 Cans of beer per week     Comment: occasional    Review of Systems  Neurological: Positive for dizziness.  All other systems reviewed and are negative.     Allergies  Latex; Morphine and related; and Pineapple  Home Medications   Prior to Admission medications   Medication Sig Start Date End Date Taking? Authorizing Provider  HYDROcodone-acetaminophen (NORCO/VICODIN) 5-325 MG per tablet Take 1 tablet by mouth every 4 (four) hours as needed. 12/22/13   Burgess AmorJulie Idol, PA-C  ibuprofen (ADVIL,MOTRIN) 600 MG  tablet Take 1 tablet (600 mg total) by mouth every 6 (six) hours as needed. 12/22/13   Burgess AmorJulie Idol, PA-C  lisinopril (PRINIVIL,ZESTRIL) 10 MG tablet Take 10 mg by mouth daily.    Historical Provider, MD  ranitidine (ZANTAC) 150 MG capsule Take 150 mg by mouth daily.    Historical Provider, MD  sertraline (ZOLOFT) 50 MG tablet Take 50 mg by mouth daily.    Historical Provider, MD   BP 118/75 mmHg  Pulse 97  Temp(Src) 97.5 F (36.4 C) (Oral)  Resp 22  Ht 5\' 11"  (1.803 m)  SpO2 95% Physical Exam  Constitutional: He is oriented to person, place, and time. He appears well-developed and well-nourished.  HENT:  Head: Normocephalic and atraumatic.  Mouth/Throat: Oropharynx is clear and moist.  Eyes: EOM are normal. Pupils are equal, round, and reactive to light.  Photophobia  Neck: Neck supple.  Cardiovascular: Normal rate and regular rhythm.   No murmur heard. Pulmonary/Chest: Effort normal and breath sounds normal. No respiratory distress.  Abdominal: Soft. There is no tenderness. There is no rebound and no guarding.  Musculoskeletal: He exhibits no edema or tenderness.  Neurological: He is alert and oriented to person, place, and time. No cranial nerve deficit. Coordination normal.  5 out of 5 strength in all 4 extremities, sensation light touch intact in all 4 extremities  Skin: Skin is warm  and dry.  Psychiatric: He has a normal mood and affect. His behavior is normal.  Nursing note and vitals reviewed.   ED Course  Procedures (including critical care time) Labs Review Labs Reviewed  BASIC METABOLIC PANEL  CBC WITH DIFFERENTIAL/PLATELET  URINALYSIS, ROUTINE W REFLEX MICROSCOPIC    Imaging Review No results found.   EKG Interpretation None      MDM   Final diagnoses:  Acute nonintractable headache, unspecified headache type    Patient here for evaluation of headache, dizziness, blurred vision. History and presentation is not consistent with acute CVA, subarachnoid  hemorrhage, meningitis. Patient is feeling improved on recheck in the emergency department. Discussed home care for headache as well as PCP follow-up and return precautions.    Bobby Fossa, MD 05/14/14 1753

## 2014-05-14 NOTE — Discharge Instructions (Signed)

## 2014-07-14 ENCOUNTER — Ambulatory Visit: Payer: Self-pay | Admitting: Family Medicine

## 2014-07-26 ENCOUNTER — Telehealth (HOSPITAL_COMMUNITY): Payer: Self-pay | Admitting: *Deleted

## 2014-07-26 NOTE — Telephone Encounter (Signed)
Provider out of office. lmtcb on pt home number.

## 2014-07-27 ENCOUNTER — Telehealth (HOSPITAL_COMMUNITY): Payer: Self-pay | Admitting: *Deleted

## 2014-07-27 NOTE — Telephone Encounter (Signed)
lmtcb due to returning pt voicemail and needing to resch appt due to provider not being in the office

## 2014-07-28 ENCOUNTER — Telehealth (HOSPITAL_COMMUNITY): Payer: Self-pay | Admitting: *Deleted

## 2014-08-02 ENCOUNTER — Encounter (HOSPITAL_COMMUNITY): Payer: Self-pay | Admitting: Emergency Medicine

## 2014-08-02 ENCOUNTER — Emergency Department (HOSPITAL_COMMUNITY)
Admission: EM | Admit: 2014-08-02 | Discharge: 2014-08-04 | Disposition: A | Discharge status: Other Acute Inpatient Hospital | Payer: 59

## 2014-08-02 DIAGNOSIS — F1510 Other stimulant abuse, uncomplicated: Secondary | ICD-10-CM | POA: Diagnosis not present

## 2014-08-02 DIAGNOSIS — F329 Major depressive disorder, single episode, unspecified: Secondary | ICD-10-CM

## 2014-08-02 DIAGNOSIS — Z8739 Personal history of other diseases of the musculoskeletal system and connective tissue: Secondary | ICD-10-CM | POA: Diagnosis not present

## 2014-08-02 DIAGNOSIS — I10 Essential (primary) hypertension: Secondary | ICD-10-CM | POA: Insufficient documentation

## 2014-08-02 DIAGNOSIS — F419 Anxiety disorder, unspecified: Secondary | ICD-10-CM | POA: Diagnosis not present

## 2014-08-02 DIAGNOSIS — F32A Depression, unspecified: Secondary | ICD-10-CM

## 2014-08-02 DIAGNOSIS — Z91040 Latex allergy status: Secondary | ICD-10-CM | POA: Insufficient documentation

## 2014-08-02 DIAGNOSIS — Z720 Tobacco use: Secondary | ICD-10-CM | POA: Diagnosis not present

## 2014-08-02 DIAGNOSIS — Z79899 Other long term (current) drug therapy: Secondary | ICD-10-CM | POA: Diagnosis not present

## 2014-08-02 DIAGNOSIS — R45850 Homicidal ideations: Secondary | ICD-10-CM

## 2014-08-02 DIAGNOSIS — Z008 Encounter for other general examination: Secondary | ICD-10-CM | POA: Diagnosis present

## 2014-08-02 DIAGNOSIS — R45851 Suicidal ideations: Secondary | ICD-10-CM

## 2014-08-02 HISTORY — DX: Headache: R51

## 2014-08-02 HISTORY — DX: Dizziness and giddiness: R42

## 2014-08-02 HISTORY — DX: Radiculopathy, cervical region: M54.12

## 2014-08-02 LAB — CBC WITH DIFFERENTIAL/PLATELET
BASOS ABS: 0 10*3/uL (ref 0.0–0.1)
BASOS PCT: 0 % (ref 0–1)
Eosinophils Absolute: 0.3 10*3/uL (ref 0.0–0.7)
Eosinophils Relative: 2 % (ref 0–5)
HCT: 41.3 % (ref 39.0–52.0)
HEMOGLOBIN: 14.7 g/dL (ref 13.0–17.0)
LYMPHS ABS: 3 10*3/uL (ref 0.7–4.0)
Lymphocytes Relative: 25 % (ref 12–46)
MCH: 31.8 pg (ref 26.0–34.0)
MCHC: 35.6 g/dL (ref 30.0–36.0)
MCV: 89.4 fL (ref 78.0–100.0)
Monocytes Absolute: 0.8 10*3/uL (ref 0.1–1.0)
Monocytes Relative: 7 % (ref 3–12)
NEUTROS PCT: 66 % (ref 43–77)
Neutro Abs: 8 10*3/uL — ABNORMAL HIGH (ref 1.7–7.7)
Platelets: 249 10*3/uL (ref 150–400)
RBC: 4.62 MIL/uL (ref 4.22–5.81)
RDW: 13.7 % (ref 11.5–15.5)
WBC: 12.1 10*3/uL — ABNORMAL HIGH (ref 4.0–10.5)

## 2014-08-02 LAB — RAPID URINE DRUG SCREEN, HOSP PERFORMED
Amphetamines: POSITIVE — AB
Barbiturates: NOT DETECTED
Benzodiazepines: NOT DETECTED
COCAINE: NOT DETECTED
OPIATES: NOT DETECTED
Tetrahydrocannabinol: NOT DETECTED

## 2014-08-02 LAB — BASIC METABOLIC PANEL
Anion gap: 11 (ref 5–15)
BUN: 20 mg/dL (ref 6–20)
CHLORIDE: 103 mmol/L (ref 101–111)
CO2: 24 mmol/L (ref 22–32)
Calcium: 9 mg/dL (ref 8.9–10.3)
Creatinine, Ser: 0.95 mg/dL (ref 0.61–1.24)
GLUCOSE: 108 mg/dL — AB (ref 65–99)
POTASSIUM: 3.6 mmol/L (ref 3.5–5.1)
SODIUM: 138 mmol/L (ref 135–145)

## 2014-08-02 LAB — ETHANOL: Alcohol, Ethyl (B): <5 mg/dL (ref <5)

## 2014-08-02 NOTE — ED Notes (Addendum)
Pt states he needs to be evaluated because he is having thoughts of harming himself and others. Pt states he doesn't have a plan today. Pt's girlfriend says he thought about harming himself and others with a "weapon." Girlfriend states pt gets irrate when she is not with him.
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
## 2014-08-02 NOTE — ED Notes (Signed)
Pt given a coke 

## 2014-08-03 MED ORDER — SERTRALINE HCL 50 MG PO TABS
50.0000 mg | ORAL_TABLET | Freq: Every day | ORAL | Status: DC
  Administered 2014-08-03 (×2): 50 mg via ORAL
  Filled 2014-08-03 (×2): qty 1

## 2014-08-03 MED ORDER — SERTRALINE HCL 50 MG PO TABS: 50.0000 mg | ORAL_TABLET | Freq: Every day | ORAL | Status: DC

## 2014-08-03 MED ORDER — FAMOTIDINE 20 MG PO TABS
20.0000 mg | ORAL_TABLET | Freq: Every day | ORAL | Status: DC
  Administered 2014-08-03 – 2014-08-04 (×2): 20 mg via ORAL
  Filled 2014-08-03 (×2): qty 1

## 2014-08-03 MED ORDER — NICOTINE 14 MG/24HR TD PT24
14.0000 mg | MEDICATED_PATCH | Freq: Once | TRANSDERMAL | Status: AC
  Administered 2014-08-03: 14 mg via TRANSDERMAL
  Filled 2014-08-03: qty 1

## 2014-08-03 MED ORDER — LISINOPRIL 10 MG PO TABS
10.0000 mg | ORAL_TABLET | Freq: Every morning | ORAL | Status: DC
  Administered 2014-08-03 – 2014-08-04 (×2): 10 mg via ORAL
  Filled 2014-08-03 (×2): qty 1

## 2014-08-03 NOTE — Progress Notes (Signed)
Pt referral fax[facility] to the following facilities:  [facility]  Will continue seeking placement.  Chad CordialLauren Carter, LCSWA 08/03/2014 11:28 AM

## 2014-08-03 NOTE — Progress Notes (Signed)
CSW is reviewing the chart for potential placement with [facility].     Rebbeca Sheperd, MSW, LCSW, LCAS Clinical Social Worker [phone]  

## 2014-08-03 NOTE — ED Notes (Signed)
Julie-pt's girlfriend #-[phone]   DO NOT CALL FATHER
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
## 2014-08-03 NOTE — ED Notes (Signed)
Pt receiving telepsych 
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
## 2014-08-03 NOTE — ED Provider Notes (Signed)
Resting comfortably. No overnight problems. Continue placement search.  Filed Vitals:   08/03/14 0632  BP: 123/60  Pulse: 117  Temp:   Resp: 20     Gilda Creasehristopher J Sahasra Belue, MD 08/03/14 [phone]

## 2014-08-04 DIAGNOSIS — F3163 Bipolar disorder, current episode mixed, severe, without psychotic features: Secondary | ICD-10-CM | POA: Diagnosis present

## 2014-08-04 DIAGNOSIS — F329 Major depressive disorder, single episode, unspecified: Secondary | ICD-10-CM | POA: Diagnosis not present

## 2014-08-04 DIAGNOSIS — R45850 Homicidal ideations: Secondary | ICD-10-CM | POA: Diagnosis present

## 2014-08-04 DIAGNOSIS — R45851 Suicidal ideations: Secondary | ICD-10-CM | POA: Diagnosis present

## 2014-08-04 DIAGNOSIS — F17210 Nicotine dependence, cigarettes, uncomplicated: Secondary | ICD-10-CM | POA: Diagnosis present

## 2014-08-04 DIAGNOSIS — K219 Gastro-esophageal reflux disease without esophagitis: Secondary | ICD-10-CM | POA: Diagnosis present

## 2014-08-04 DIAGNOSIS — I10 Essential (primary) hypertension: Secondary | ICD-10-CM | POA: Diagnosis present

## 2014-08-04 DIAGNOSIS — F411 Generalized anxiety disorder: Secondary | ICD-10-CM | POA: Diagnosis present

## 2014-08-04 NOTE — Progress Notes (Signed)
CSW spoke with Kathlene NovemberMike at [facility] and they are ready for patient to transfer- [facility] RN, Tammy SoursGreg, aware and assisting with transfer.  Reece LevyJanet Dolphus Linch, MSW, Theresia MajorsLCSWA  [phone]

## 2014-08-04 NOTE — ED Notes (Signed)
[facility] here to transport pt ?

## 2014-08-04 NOTE — ED Notes (Signed)
Spoke with Eulogio Bearhonda Whitley, RN, pt has been accepted at [facility] and can come after 0900. Accepting physician is Merideth AbbeyEnrique Lopez, MD. Pt must be IVC before sending, EDP aware.

## 2014-08-04 NOTE — ED Notes (Signed)
IVC papers have been taken out, awaiting serving of papers.

## 2014-08-09 ENCOUNTER — Ambulatory Visit (HOSPITAL_COMMUNITY): Payer: Self-pay | Admitting: Psychiatry

## 2014-08-19 ENCOUNTER — Encounter: Payer: Self-pay | Admitting: Internal Medicine

## 2014-09-05 ENCOUNTER — Emergency Department (HOSPITAL_COMMUNITY)
Admission: EM | Admit: 2014-09-05 | Discharge: 2014-09-05 | Disposition: A | Discharge status: Home / Self Care | Payer: 59 | Attending: Emergency Medicine | Admitting: Emergency Medicine

## 2014-09-05 ENCOUNTER — Emergency Department (HOSPITAL_COMMUNITY): Payer: 59

## 2014-09-05 ENCOUNTER — Encounter (HOSPITAL_COMMUNITY): Payer: Self-pay | Admitting: Emergency Medicine

## 2014-09-05 DIAGNOSIS — Z720 Tobacco use: Secondary | ICD-10-CM | POA: Diagnosis not present

## 2014-09-05 DIAGNOSIS — R1084 Generalized abdominal pain: Secondary | ICD-10-CM | POA: Insufficient documentation

## 2014-09-05 DIAGNOSIS — I10 Essential (primary) hypertension: Secondary | ICD-10-CM | POA: Diagnosis not present

## 2014-09-05 DIAGNOSIS — R112 Nausea with vomiting, unspecified: Secondary | ICD-10-CM | POA: Insufficient documentation

## 2014-09-05 DIAGNOSIS — Z91040 Latex allergy status: Secondary | ICD-10-CM | POA: Insufficient documentation

## 2014-09-05 DIAGNOSIS — R197 Diarrhea, unspecified: Secondary | ICD-10-CM | POA: Diagnosis not present

## 2014-09-05 DIAGNOSIS — F329 Major depressive disorder, single episode, unspecified: Secondary | ICD-10-CM | POA: Insufficient documentation

## 2014-09-05 DIAGNOSIS — R109 Unspecified abdominal pain: Secondary | ICD-10-CM

## 2014-09-05 DIAGNOSIS — Z8739 Personal history of other diseases of the musculoskeletal system and connective tissue: Secondary | ICD-10-CM | POA: Diagnosis not present

## 2014-09-05 DIAGNOSIS — F419 Anxiety disorder, unspecified: Secondary | ICD-10-CM | POA: Diagnosis not present

## 2014-09-05 DIAGNOSIS — Z79899 Other long term (current) drug therapy: Secondary | ICD-10-CM | POA: Insufficient documentation

## 2014-09-05 LAB — COMPREHENSIVE METABOLIC PANEL
ALK PHOS: 67 U/L (ref 38–126)
ALT: 23 U/L (ref 17–63)
AST: 17 U/L (ref 15–41)
Albumin: 5 g/dL (ref 3.5–5.0)
Anion gap: 7 (ref 5–15)
BUN: 16 mg/dL (ref 6–20)
CO2: 29 mmol/L (ref 22–32)
CREATININE: 1.03 mg/dL (ref 0.61–1.24)
Calcium: 9.8 mg/dL (ref 8.9–10.3)
Chloride: 103 mmol/L (ref 101–111)
Glucose, Bld: 88 mg/dL (ref 65–99)
POTASSIUM: 3.8 mmol/L (ref 3.5–5.1)
SODIUM: 139 mmol/L (ref 135–145)
Total Bilirubin: 0.6 mg/dL (ref 0.3–1.2)
Total Protein: 8.4 g/dL — ABNORMAL HIGH (ref 6.5–8.1)

## 2014-09-05 LAB — CBC WITH DIFFERENTIAL/PLATELET
BASOS ABS: 0 10*3/uL (ref 0.0–0.1)
Basophils Relative: 0 % (ref 0–1)
Eosinophils Absolute: 0.1 10*3/uL (ref 0.0–0.7)
Eosinophils Relative: 1 % (ref 0–5)
HCT: 42.9 % (ref 39.0–52.0)
Hemoglobin: 15 g/dL (ref 13.0–17.0)
Lymphocytes Relative: 18 % (ref 12–46)
Lymphs Abs: 1.8 10*3/uL (ref 0.7–4.0)
MCH: 31.6 pg (ref 26.0–34.0)
MCHC: 35 g/dL (ref 30.0–36.0)
MCV: 90.3 fL (ref 78.0–100.0)
Monocytes Absolute: 0.9 10*3/uL (ref 0.1–1.0)
Monocytes Relative: 9 % (ref 3–12)
Neutro Abs: 7 10*3/uL (ref 1.7–7.7)
Neutrophils Relative %: 72 % (ref 43–77)
PLATELETS: 285 10*3/uL (ref 150–400)
RBC: 4.75 MIL/uL (ref 4.22–5.81)
RDW: 13 % (ref 11.5–15.5)
WBC: 9.7 10*3/uL (ref 4.0–10.5)

## 2014-09-05 LAB — URINALYSIS, ROUTINE W REFLEX MICROSCOPIC
Glucose, UA: NEGATIVE mg/dL
HGB URINE DIPSTICK: NEGATIVE
LEUKOCYTES UA: NEGATIVE
NITRITE: NEGATIVE
PROTEIN: 30 mg/dL — AB
Specific Gravity, Urine: >1.03 — ABNORMAL HIGH (ref 1.005–1.030)
UROBILINOGEN UA: 0.2 mg/dL (ref 0.0–1.0)
pH: 6 (ref 5.0–8.0)

## 2014-09-05 LAB — URINE MICROSCOPIC-ADD ON

## 2014-09-05 LAB — LIPASE, BLOOD: LIPASE: 25 U/L (ref 22–51)

## 2014-09-05 MED ORDER — IOHEXOL 300 MG/ML  SOLN
25.0000 mL | Freq: Once | INTRAMUSCULAR | Status: AC | PRN
Start: 2014-09-05 — End: 2014-09-05
  Administered 2014-09-05: 25 mL via ORAL

## 2014-09-05 MED ORDER — DICYCLOMINE HCL 20 MG PO TABS: 20.0000 mg | ORAL_TABLET | Freq: Four times a day (QID) | ORAL | Status: DC | PRN

## 2014-09-05 MED ORDER — ONDANSETRON HCL 4 MG/2ML IJ SOLN
4.0000 mg | INTRAMUSCULAR | Status: DC | PRN
  Administered 2014-09-05: 4 mg via INTRAVENOUS
  Filled 2014-09-05: qty 2

## 2014-09-05 MED ORDER — FENTANYL CITRATE (PF) 100 MCG/2ML IJ SOLN
50.0000 ug | INTRAMUSCULAR | Status: AC | PRN
  Administered 2014-09-05 (×2): 50 ug via INTRAVENOUS
  Filled 2014-09-05 (×2): qty 2

## 2014-09-05 MED ORDER — ONDANSETRON HCL 4 MG PO TABS: 4.0000 mg | ORAL_TABLET | Freq: Three times a day (TID) | ORAL | Status: DC | PRN

## 2014-09-05 MED ORDER — SODIUM CHLORIDE 0.9 % IV BOLUS (SEPSIS)
500.0000 mL | Freq: Once | INTRAVENOUS | Status: AC
  Administered 2014-09-05: 500 mL via INTRAVENOUS

## 2014-09-05 MED ORDER — IOHEXOL 300 MG/ML  SOLN
100.0000 mL | Freq: Once | INTRAMUSCULAR | Status: AC | PRN
  Administered 2014-09-05: 100 mL via INTRAVENOUS

## 2014-09-05 NOTE — ED Provider Notes (Signed)
CSN: [id number]     Arrival date & time 09/05/14  1929 History   First MD Initiated Contact with Patient 09/05/14 1944     Chief Complaint  Patient presents with  . Abdominal Pain      HPI Pt was seen at 1955.  Per pt, c/o gradual onset and persistence of constant generalized abd "pain" for the past 1 week.  Has been associated with multiple intermittent episodes of N/V/D.  Describes the abd pain as occasionally radiating into the left side of his lower back. Pt was evaluated by his PMD this week for same, and "was prescribed some pain medicines that aren't working." Denies fevers, no flank pain, no rash, no CP/SOB, no black or blood in stools or emesis, no dysuria/hematuria, no testicular pain/swelling.      Past Medical History  Diagnosis Date  . Hypertension   . Depression   . Anxiety   . Dizziness   . Lightheaded   . Headache   . Cervical radiculopathy   . Vertigo    Past Surgical History  Procedure Laterality Date  . Wisdom tooth extraction    . Knee surgery Right    Family History  Problem Relation Age of Onset  . High blood pressure Mother   . Seizures Mother    History  Substance Use Topics  . Smoking status: Current Every Day Smoker  . Smokeless tobacco: Current User    Types: Chew  . Alcohol Use: 1.2 oz/week    2 Cans of beer per week     Comment: occasional    Review of Systems ROS: Statement: All systems negative except as marked or noted in the HPI; Constitutional: Negative for fever and chills. ; ; Eyes: Negative for eye pain, redness and discharge. ; ; ENMT: Negative for ear pain, hoarseness, nasal congestion, sinus pressure and sore throat. ; ; Cardiovascular: Negative for chest pain, palpitations, diaphoresis, dyspnea and peripheral edema. ; ; Respiratory: Negative for cough, wheezing and stridor. ; ; Gastrointestinal: +N/V/D, abd pain. Negative for blood in stool, hematemesis, jaundice and rectal bleeding. . ; ; Genitourinary: Negative for dysuria, flank  pain and hematuria. ; ; Genital:  No penile drainage or rash, no testicular pain or swelling, no scrotal rash or swelling. ;; Musculoskeletal: Negative for back pain and neck pain. Negative for swelling and trauma.; ; Skin: Negative for pruritus, rash, abrasions, blisters, bruising and skin lesion.; ; Neuro: Negative for headache, lightheadedness and neck stiffness. Negative for weakness, altered level of consciousness , altered mental status, extremity weakness, paresthesias, involuntary movement, seizure and syncope.      Allergies  Latex; Morphine and related; and Pineapple  Home Medications   Prior to Admission medications   Medication Sig Start Date End Date Taking? Authorizing Provider  lisinopril (PRINIVIL,ZESTRIL) 10 MG tablet Take 10 mg by mouth daily.    Historical Provider, MD  ranitidine (ZANTAC) 150 MG capsule Take 150 mg by mouth daily.    Historical Provider, MD  sertraline (ZOLOFT) 50 MG tablet Take 50 mg by mouth daily.    Historical Provider, MD   BP 113/96 mmHg  Pulse 141  Temp(Src) 98 F (36.7 C) (Oral)  Resp 20  Ht 5\' 10"  (1.778 m)  Wt 232 lb (105.235 kg)  BMI 33.29 kg/m2  SpO2 100%  BP 134/87 mmHg  Pulse 110  Temp(Src) 98.7 F (37.1 C) (Oral)  Resp 22  Ht 5\' 10"  (1.778 m)  Wt 232 lb (105.235 kg)  BMI 33.29 kg/m2  SpO2 97%   Physical Exam  2000; Physical examination:  Nursing notes reviewed; Vital signs and O2 SAT reviewed;  Constitutional: Well developed, Well nourished, Well hydrated, In no acute distress; Head:  Normocephalic, atraumatic; Eyes: EOMI, PERRL, No scleral icterus; ENMT: Mouth and pharynx normal, Mucous membranes moist; Neck: Supple, Full range of motion, No lymphadenopathy; Cardiovascular: Tachycardic rate and rhythm, No gallop; Respiratory: Breath sounds clear & equal bilaterally, No wheezes.  Speaking full sentences with ease, Normal respiratory effort/excursion; Chest: Nontender, Movement normal; Abdomen: Soft, +LLQ>diffuse tenderness to  palp. No rebound or guarding. Nondistended, Normal bowel sounds; Genitourinary: No CVA tenderness; Extremities: Pulses normal, No tenderness, No edema, No calf edema or asymmetry.; Neuro: AA&Ox3, Major CN grossly intact.  Speech clear. No gross focal motor or sensory deficits in extremities.; Skin: Color normal, Warm, Dry.   ED Course  Procedures     EKG Interpretation None      MDM  MDM Reviewed: previous chart, nursing note and vitals Reviewed previous: labs Interpretation: labs and CT scan     Results for orders placed or performed during the hospital encounter of 09/05/14  Urinalysis, Routine w reflex microscopic (not at [facility])  Result Value Ref Range   Color, Urine YELLOW YELLOW   APPearance HAZY (A) CLEAR   Specific Gravity, Urine >1.030 (H) 1.005 - 1.030   pH 6.0 5.0 - 8.0   Glucose, UA NEGATIVE NEGATIVE mg/dL   Hgb urine dipstick NEGATIVE NEGATIVE   Bilirubin Urine SMALL (A) NEGATIVE   Ketones, ur TRACE (A) NEGATIVE mg/dL   Protein, ur 30 (A) NEGATIVE mg/dL   Urobilinogen, UA 0.2 0.0 - 1.0 mg/dL   Nitrite NEGATIVE NEGATIVE   Leukocytes, UA NEGATIVE NEGATIVE  Lipase, blood  Result Value Ref Range   Lipase 25 22 - 51 U/L  Comprehensive metabolic panel  Result Value Ref Range   Sodium 139 135 - 145 mmol/L   Potassium 3.8 3.5 - 5.1 mmol/L   Chloride 103 101 - 111 mmol/L   CO2 29 22 - 32 mmol/L   Glucose, Bld 88 65 - 99 mg/dL   BUN 16 6 - 20 mg/dL   Creatinine, Ser 9.60 0.61 - 1.24 mg/dL   Calcium 9.8 8.9 - 45.4 mg/dL   Total Protein 8.4 (H) 6.5 - 8.1 g/dL   Albumin 5.0 3.5 - 5.0 g/dL   AST 17 15 - 41 U/L   ALT 23 17 - 63 U/L   Alkaline Phosphatase 67 38 - 126 U/L   Total Bilirubin 0.6 0.3 - 1.2 mg/dL   GFR calc non Af Amer >60 >60 mL/min   GFR calc Af Amer >60 >60 mL/min   Anion gap 7 5 - 15  CBC with Differential  Result Value Ref Range   WBC 9.7 4.0 - 10.5 K/uL   RBC 4.75 4.22 - 5.81 MIL/uL   Hemoglobin 15.0 13.0 - 17.0 g/dL   HCT 09.8 11.9 - 14.7 %    MCV 90.3 78.0 - 100.0 fL   MCH 31.6 26.0 - 34.0 pg   MCHC 35.0 30.0 - 36.0 g/dL   RDW 82.9 56.2 - 13.0 %   Platelets 285 150 - 400 K/uL   Neutrophils Relative % 72 43 - 77 %   Neutro Abs 7.0 1.7 - 7.7 K/uL   Lymphocytes Relative 18 12 - 46 %   Lymphs Abs 1.8 0.7 - 4.0 K/uL   Monocytes Relative 9 3 - 12 %   Monocytes Absolute 0.9 0.1 - 1.0 K/uL  Eosinophils Relative 1 0 - 5 %   Eosinophils Absolute 0.1 0.0 - 0.7 K/uL   Basophils Relative 0 0 - 1 %   Basophils Absolute 0.0 0.0 - 0.1 K/uL  Urine microscopic-add on  Result Value Ref Range   Squamous Epithelial / LPF RARE RARE   WBC, UA 0-2 <3 WBC/hpf   RBC / HPF 0-2 <3 RBC/hpf   Bacteria, UA FEW (A) RARE   Crystals CA OXALATE CRYSTALS (A) NEGATIVE   Urine-Other MUCOUS PRESENT    Ct Abdomen Pelvis W Contrast 09/05/2014   CLINICAL DATA:  Diffuse abdominal pain with vomiting and diarrhea  EXAM: CT ABDOMEN AND PELVIS WITH CONTRAST  TECHNIQUE: Multidetector CT imaging of the abdomen and pelvis was performed using the standard protocol following bolus administration of intravenous contrast. Oral contrast was also administered.  CONTRAST:  25mL OMNIPAQUE IOHEXOL 300 MG/ML SOLN, OMNIPAQUE IOHEXOL 300 MG/ML SOLN  COMPARISON:  November 16, 2013  FINDINGS: Lung bases are clear.  No focal liver lesions are identified. Gallbladder wall is not appreciably thickened. There is no biliary duct dilatation.  Spleen, pancreas, and adrenals appear normal. Kidneys bilaterally show no mass or hydronephrosis on either side. There is no renal or ureteral calculus on either side.  In the pelvis, the urinary bladder is decompressed. Given the degree of decompression, the urinary bladder wall is not felt to be thickened. There is no pelvic mass or pelvic fluid collection. There are scattered small sigmoid diverticula without diverticulitis.  Appendix appears normal.  There is a minimal ventral hernia containing only fat.  There is no bowel obstruction.  No free air  or portal venous air.  There are small retroperitoneal lymph nodes which do not meet size criteria for pathologic significance. By size criteria, there is no adenopathy in the abdomen or pelvis. Aorta is nonaneurysmal. There is a circumaortic left renal vein, an anatomic variant. There are no blastic or lytic bone lesions.  IMPRESSION: A cause for the patient's symptoms has not been established with this study. No bowel obstruction. No bowel wall or mesenteric thickening. No abscess. Appendix appears normal. No renal or ureteral calculus. No hydronephrosis.  There are occasional sigmoid diverticula without inflammation appreciable. There is a minimal ventral hernia containing only fat.   Electronically Signed   By: Bretta Bang III M.D.   On: 09/05/2014 21:26    2225:  Pt has tol PO well while in the ED without N/V.  No stooling while in the ED.  Abd benign, VSS. Feels better and wants to go home now. Pt's family is at the bedside and now reveals that pt has an appt with GI Dr. Jena Gauss for these recurrent symptoms. Tx symptomatically at this time. Dx and testing d/w pt and family.  Questions answered.  Verb understanding, agreeable to d/c home with outpt f/u.    Samuel Jester, DO 09/08/14 1720

## 2014-09-05 NOTE — Discharge Instructions (Signed)
°Emergency Department Resource Guide °1) Find a Doctor and Pay Out of Pocket °Although you won't have to find out who is covered by your insurance plan, it is a good idea to ask around and get recommendations. You will then need to call the office and see if the doctor you have chosen will accept you as a new patient and what types of options they offer for patients who are self-pay. Some doctors offer discounts or will set up payment plans for their patients who do not have insurance, but you will need to ask so you aren't surprised when you get to your appointment. ° °2) Contact Your Local Health Department °Not all health departments have doctors that can see patients for sick visits, but many do, so it is worth a call to see if yours does. If you don't know where your local health department is, you can check in your phone book. The CDC also has a tool to help you locate your state's health department, and many state websites also have listings of all of their local health departments. ° °3) Find a Walk-in Clinic °If your illness is not likely to be very severe or complicated, you may want to try a walk in clinic. These are popping up all over the country in pharmacies, drugstores, and shopping centers. They're usually staffed by nurse practitioners or physician assistants that have been trained to treat common illnesses and complaints. They're usually fairly quick and inexpensive. However, if you have serious medical issues or chronic medical problems, these are probably not your best option. ° °No Primary Care Doctor: °- Call [facility] at  832-8000 - they can help you locate a primary care doctor that  accepts your insurance, provides certain services, etc. °- Physician Referral Service- 1-800-533-3463 ° °Chronic Pain Problems: °Organization         Address  Phone   Notes  °[facility]  (336) 297-2271 Patients need to be referred by their primary care doctor.  ° °Medication  Assistance: °Organization         Address  Phone   Notes  °[place] [facility] [address] °[place], [place] [address] (336) 641-8030 --Must be a resident of [place] °-- Must have NO insurance coverage whatsoever (no Medicaid/ Medicare, etc.) °-- The pt. MUST have a primary care doctor that directs their care regularly and follows them in the community °  °[facility]  (866) 331-1348   °[employer]  (888) 892-1162   ° °Agencies that provide inexpensive medical care: °Organization         Address  Phone   Notes  °[facility]  (336) 832-8035   °[facility]    (336) 832-7272   °[facility] [address] °[place], [place] [address] (336) 832-4777   °[facility] [address], °[place] (336) 271-4999   °[facility]    (336) 373-0678   °[facility]    (336) 272-1050   °[facility] ° [address], [place] Phone:  (336) 832-4444, Fax:  (336) 832-4440 Hours of Operation:  9 am - 6 pm, M-F.  Also accepts Medicaid/Medicare and self-pay.  °[facility] ° [address], [place] Phone: (336) 832-3150, Fax: (336) [phone]. Hours of Operation:  8:30 am - 5:30 pm, M-F.  Also accepts Medicaid and self-pay.  °[facility] [place] [address], [place] Phone: (336) 878-6027   °[facility] [address], [place], [place] (336)723-1848, Ext. 123 Mondays & Thursdays: 7-9 AM.  First 15 patients are seen on a first come, first serve basis. °  ° °Medicaid-accepting [place] Providers: ° °Organization         Address  Phone   Notes  °[facility] [address], [place] (336) 641-2100 Also accepts self-pay patients.  °[facility] [address], [place] ° (336) 856-9996   °[facility] [address], [place]  (336) 288-8857   °[facility] [address] [place] [address], [place] (336) 299-7000   °Veita Bland [address], [place]  ° (336) 373-1557 Only accepts [place] Access Medicaid patients after they have their name applied to their card.  ° °Self-Pay (no insurance) in [place]: ° °Organization         Address  Phone   Notes  °Sickle Cell Patients, [facility] [address], [place] (336) 832-1970   °[facility] [address], [place] (336) 832-4400   °[facility] ° [address], [place] (336) 992-4800   °[facility]/Dr. Osei-Bonsu ° [address] [place] [address], [place] or [address], [place] (336) 841-8500 Phone number for both [place] and [place] locations is the same.  °[facility] [address], [place] (336) 299-0000   °[facility] [address] [place] [address], [place] or [address] (336) 852-7530 °(336) 878-2260   °[facility] [address], [place] (336) 350-1642, phone; (336) 294-5005, fax Sees patients 1st and 3rd Saturday of every month.  Must not qualify for public or private insurance (i.e. Medicaid, Medicare, [place] Health Choice, Veterans' Benefits) • Household income should be no more than 200% of the poverty level •The clinic cannot treat you if you are pregnant or think you are pregnant • Sexually transmitted diseases are not treated at the clinic.  ° ° °Dental Care: °Organization         Address  Phone  Notes  °[place] [facility] [address], [place] (336) 641-6152 Accepts children up to age 21 who are enrolled in Medicaid or [place] Health Choice; pregnant women with a Medicaid card; and children who have applied for Medicaid or [place] Health Choice, but were declined, whose parents can pay a reduced fee at time of service.  °[place]  [facility] [place]  [address], [place] (336) 641-7733 Accepts children up to age 21 who are enrolled in Medicaid or [place] Health Choice; pregnant women with a Medicaid card; and children who have applied for Medicaid or [place] Health Choice, but were declined, whose parents can pay a reduced fee at time of service.  °[facility] ° [address], [place] (336) 641-4533 Patients are seen by appointment only. Walk-ins are not accepted. [facility] will see patients 18 years of age and older. °Monday - Tuesday (8am-5pm) °Most Wednesdays (8:30-5pm) °$30 per visit, cash only  °[facility] ° [address], [place] (336) 641-4533 Patients are seen by appointment only. Walk-ins are not accepted. [facility] will see patients 18 years of age and older. °One   Wednesday Evening (Monthly: Volunteer Based).  $30 per visit, cash only  °[facility]  (919) 537-3737 for adults; Children under age 4, call Graduate Pediatric Dentistry at ([phone]) [phone]. Children aged 4-14, please call (919) [phone] to request a pediatric application. ° Dental services are provided in all areas of dental care including fillings, crowns and bridges, complete and partial dentures, implants, gum treatment, root canals, and extractions. Preventive care is also provided. Treatment is provided to both adults and children. °Patients are selected via a lottery and there is often a waiting list. °  °[facility] [address], °[place] ° (336) 763-8833 [email or web address] °  °[facility] [address], [place], [place] (336)723-1848, Ext. 123 Second and Fourth Thursday of each month, opens at 6:30 AM; Clinic ends at 9 AM.  Patients are seen on a first-come first-served basis, and a limited number are seen during each clinic.  ° °[facility] ° [address], [place], [place] (336) 723-7904    Eligibility Requirements °You must have lived in [place], [place], or [place] counties for at least the last three months. °  You cannot be eligible for state or federal sponsored healthcare insurance, including Veterans Administration, Medicaid, or Medicare. °  You generally cannot be eligible for healthcare insurance through your employer.  °  How to apply: °Eligibility screenings are held every Tuesday and Wednesday afternoon from 1:00 pm until 4:00 pm. You do not need an appointment for the interview!  °[facility] [address], [place], [place] 336-631-2330   °[facility]  336-342-8273   °[facility]  336-703-3100   °[facility]  336-570-6415   ° °Behavioral Health Resources in the Community: °Intensive Outpatient Programs °Organization         Address  Phone  Notes  °[facility] [address], [place], [place] 336-878-6098   °[facility] [address], [place], [place] 336-832-9800   °[facility] [address], [place], [place] ° 336-882-2125   °[facility] [address],  °[place], [place] 1-800-853-5163 or 336-641-4981   °Substance Abuse Resources °Organization         Address  Phone  Notes  °[facility]  336-882-2125   °[facility]  336-784-9470   °[facility]  336-285-9073   °[facility]  336-845-3988   °Residential & Outpatient Substance Abuse Program  1-800-659-3381   °Psychological Services °Organization         Address  Phone  Notes  °[facility]  336- 832-9600   °[facility]  336- 378-7881   °[facility] [address], [place] 1-800-853-5163 or 336-641-4981   ° °Mobile Crisis Teams °Organization         Address  Phone  Notes  °[facility], Mobile Crisis Care Unit  1-877-626-1772   °Assertive °[facility] ° [address]  [place], [place] 336-834-9664   °Sharon DeEsch [address] °[place] 336-554-5454   ° °Self-Help/Support Groups °Organization         Address  Phone             Notes  °[facility]  - variety of support groups  336- 373-1402 Call for more information  °Narcotics Anonymous (NA), [facility] [address], °[place]  2 meetings at this location  ° °  Residential Treatment Programs Organization         Address  Phone  Notes  [facility] [address]    [place]  6-045-409-8119   [facility]  [address] [place] [address], [place], [place] 562-130-8657   [facility] [address] [place], [place] [phone] Admissions: 8am-3pm M-F  [facility] [address],    [place], [place] 413-244-0102   [facility] [address] [place], [place], [place] [phone]   [facility] [address],  [place], [place] [phone]   [facility] Intensive Outpatient [address], [place], [place] 756-433-2951   [facility] ([facility] [address] [place]  [place], [place] 8-841-660-6301 or (820)810-4870   [facility] ([facility] [address], [place], [place] 732-202-5427 Accepts Medicaid  [facility] [address],  [place] 0-623-762-8315 Substance Abuse/Addiction Treatment   [facility]         Address  Phone  Notes  [facility]  (310) 823-7847   Angie Fava, PhD [address] Ervin Knack [place], [place]   9015441858 or [phone]   [facility]   [address] [place], [place] [phone]   [facility] [address] [place], [place] [phone] Insurance/Medicaid/sponsorship through [facility] [address]                                    [place], [place] [phone] Therapy/tele-psych/case    [facility] [address], [place] (216)[phone]    Dr. Lolly Mustache  [phone]   [facility]  [employer] [facility] Dept. 1) [address], [place] 2) [address], [place] 3)  [address], [place] [phone]  [phone]   [facility] Child Abuse Hotline (848)211-8390 or 431-841-9206 (After Hours)      Take the prescriptions as directed.  Increase your fluid intake (ie:  Gatoraide) for the next few days.  Eat a bland diet and advance to your regular diet slowly as you can tolerate it.   Avoid full strength juices, as well as milk and milk products until your diarrhea has resolved.   Call your regular medical doctor and your GI doctor Monday to schedule a follow up appointment this week.  Return to the Emergency Department immediately if not improving (or even worsening) despite taking the medicines as prescribed, any black or bloody stool or vomit, if you develop a fever over "101," or for any other concerns.

## 2014-09-05 NOTE — ED Notes (Signed)
Patient complaining of diffuse abdominal pain, vomiting, and diarrhea. Also reports difficulty urinating at times.

## 2014-09-15 ENCOUNTER — Encounter: Payer: Self-pay | Admitting: Gastroenterology

## 2014-09-15 ENCOUNTER — Ambulatory Visit (INDEPENDENT_AMBULATORY_CARE_PROVIDER_SITE_OTHER): Payer: 59 | Admitting: Gastroenterology

## 2014-09-15 VITALS — BP 135/85 | HR 125 | Temp 98.0°F | Ht 70.0 in | Wt 216.2 lb

## 2014-09-15 DIAGNOSIS — R109 Unspecified abdominal pain: Secondary | ICD-10-CM | POA: Diagnosis not present

## 2014-09-15 DIAGNOSIS — K219 Gastro-esophageal reflux disease without esophagitis: Secondary | ICD-10-CM | POA: Insufficient documentation

## 2014-09-15 DIAGNOSIS — R112 Nausea with vomiting, unspecified: Secondary | ICD-10-CM

## 2014-09-15 DIAGNOSIS — R197 Diarrhea, unspecified: Secondary | ICD-10-CM

## 2014-09-15 MED ORDER — DICYCLOMINE HCL 10 MG PO CAPS: 10.0000 mg | ORAL_CAPSULE | Freq: Three times a day (TID) | ORAL | Status: DC

## 2014-09-15 MED ORDER — PANTOPRAZOLE SODIUM 40 MG PO TBEC: 40.0000 mg | DELAYED_RELEASE_TABLET | Freq: Every day | ORAL | Status: DC

## 2014-09-15 NOTE — Patient Instructions (Signed)
1. Please have stool studies done. We will call you with results in 7-10 business days. 2. Start pantoprazole once daily before breakfast. Stop ranitidine.  3. Start Bentyl once before each meal and at bedtime for diarrhea. Hold for constipation.  4. We will hold off on colonoscopy and upper endoscopy for now at your request.

## 2014-09-15 NOTE — Progress Notes (Signed)
Primary Care Physician: Fredirick Maudlin, MD  Primary Gastroenterologist:  Roetta Sessions, MD   Chief Complaint  Patient presents with  . Abdominal Pain  . Nausea  . Emesis    HPI: Bobby Jimenez is a 34 y.o. male here for further evaluation of abdominal pain, N/V/D. PMH significant for bipolar disorder with recent admission into psychiatric facility. Zoloft increased and Abilify started. Since discharge, patient complains of several week h/o left sided abdominal pain. Pain worse with meals. Worse when lays down at night. Worse with movement. Zantac and Pepto help some. Having BM helps. States he has h/o ulcers from drinking too much etoh back in his 104s. No prior EGD. Has been on Zantac for years. Partially controlled heartburn. Never on PPI until Protonix during recent admission which seemed to help. Complains of postprandial loose stools. Rare solid stool. Some nocturnal BMs. Occasional brbpr on toilet tissue. No melena. Several BMs daily. No recent abx. Patient reports celiac screen negative in the past by Dr. Juanetta Gosling.   Reviewed recent labs and CT report from ED visit. See below for details.   Current Outpatient Prescriptions  Medication Sig Dispense Refill  . ABILIFY 10 MG tablet Take 10 mg by mouth daily.     Marland Kitchen HYDROcodone-acetaminophen (NORCO/VICODIN) 5-325 MG per tablet Take 1 tablet by mouth every 4 (four) hours as needed for moderate pain or severe pain.     . hydrOXYzine (VISTARIL) 50 MG capsule Take 50 mg by mouth at bedtime.     Marland Kitchen lisinopril (PRINIVIL,ZESTRIL) 10 MG tablet Take 10 mg by mouth daily.    Marland Kitchen lovastatin (MEVACOR) 40 MG tablet Take 40 mg by mouth daily.     . ranitidine (ZANTAC) 150 MG capsule Take 150 mg by mouth daily.    Marland Kitchen ZOLOFT 100 MG tablet Take 100 mg by mouth daily.      No current facility-administered medications for this visit.    Allergies as of 09/15/2014 - Review Complete 09/15/2014  Allergen Reaction Noted  . Pineapple Shortness Of  Breath and Swelling 05/14/2014  . Latex Swelling 05/14/2014  . Morphine and related Hives and Itching 05/14/2014   Past Medical History  Diagnosis Date  . Hypertension   . Depression   . Anxiety   . Dizziness   . Lightheaded   . Headache   . Cervical radiculopathy   . Vertigo   . Bipolar disorder   . ADHD (attention deficit hyperactivity disorder)   . Low back pain    Past Surgical History  Procedure Laterality Date  . Wisdom tooth extraction    . Knee surgery Right    Family History  Problem Relation Age of Onset  . Adopted: Yes  . High blood pressure Mother   . Seizures Mother    Social History   Social History  . Marital Status: Divorced    Spouse Name: N/A  . Number of Children: 0  . Years of Education: 30   Social History Main Topics  . Smoking status: Current Every Day Smoker  . Smokeless tobacco: Current User    Types: Chew  . Alcohol Use: 1.2 oz/week    2 Cans of beer per week     Comment: heavy in past, short period of time in 60s.   . Drug Use: No     Comment: Quit 1996  . Sexual Activity: Not Asked   Other Topics Concern  . None   Social History Narrative   Patient lives  at home with his father and unemployed.   Education some college.   Both handed.   Caffeine three cups daily soda and coffee.      ROS:  General: Negative for  weight loss, fever, chills, fatigue, weakness. +anorexia. Eyes: Negative for vision changes.  ENT: Negative for hoarseness, difficulty swallowing , nasal congestion. CV: Negative for chest pain, angina, palpitations, dyspnea on exertion, peripheral edema.  Respiratory: Negative for dyspnea at rest, dyspnea on exertion, cough, sputum, wheezing.  GI: See history of present illness. GU:  Negative for dysuria, hematuria, urinary incontinence, urinary frequency, nocturnal urination.  MS: Negative for joint pain, low back pain.  Derm: Negative for rash or itching.  Neuro: Negative for weakness, abnormal sensation,  seizure, frequent headaches, memory loss, confusion.  Psych: Negative for suicidal ideation, hallucinations. See hpi Endo: Negative for unusual weight change.  Heme: Negative for bruising or bleeding. Allergy: Negative for rash or hives.     Physical Examination:   BP 135/85 mmHg  Pulse 125  Temp(Src) 98 F (36.7 C)  Ht  (1.778 m)  Wt 216 lb 3.2 oz (98.068 kg)  BMI 31.02 kg/m2  General: Well-nourished, well-developed in no acute distress. Accompanied by dad.  Eyes: No icterus. Mouth: Oropharyngeal mucosa moist and pink , no lesions erythema or exudate. Lungs: Clear to auscultation bilaterally.  Heart: Regular rate and rhythm, no murmurs rubs or gallops.  Abdomen: Bowel sounds are normal, left sided abd tenderness mostly mid to lower left abd, nondistended, no hepatosplenomegaly or masses, no abdominal bruits or hernia , no rebound or guarding.  +/-Carnett's sign. Extremities: No lower extremity edema. No clubbing or deformities. Neuro: Alert and oriented x 4   Skin: Warm and dry, no jaundice.   Psych: Alert and cooperative, +anxious. Polite and appropriate.  Labs:  Lab Results  Component Value Date   LIPASE 25 09/05/2014   Lab Results  Component Value Date   CREATININE 1.03 09/05/2014   BUN 16 09/05/2014   NA 139 09/05/2014   K 3.8 09/05/2014   CL 103 09/05/2014   CO2 29 09/05/2014   Lab Results  Component Value Date   ALT 23 09/05/2014   AST 17 09/05/2014   ALKPHOS 67 09/05/2014   BILITOT 0.6 09/05/2014   Lab Results  Component Value Date   WBC 9.7 09/05/2014   HGB 15.0 09/05/2014   HCT 42.9 09/05/2014   MCV 90.3 09/05/2014   PLT 285 09/05/2014    Imaging Studies: Ct Abdomen Pelvis W Contrast  09/05/2014   CLINICAL DATA:  Diffuse abdominal pain with vomiting and diarrhea  EXAM: CT ABDOMEN AND PELVIS WITH CONTRAST  TECHNIQUE: Multidetector CT imaging of the abdomen and pelvis was performed using the standard protocol following bolus administration of  intravenous contrast. Oral contrast was also administered.  CONTRAST:  25mL OMNIPAQUE IOHEXOL 300 MG/ML SOLN, OMNIPAQUE IOHEXOL 300 MG/ML SOLN  COMPARISON:  November 16, 2013  FINDINGS: Lung bases are clear.  No focal liver lesions are identified. Gallbladder wall is not appreciably thickened. There is no biliary duct dilatation.  Spleen, pancreas, and adrenals appear normal. Kidneys bilaterally show no mass or hydronephrosis on either side. There is no renal or ureteral calculus on either side.  In the pelvis, the urinary bladder is decompressed. Given the degree of decompression, the urinary bladder wall is not felt to be thickened. There is no pelvic mass or pelvic fluid collection. There are scattered small sigmoid diverticula without diverticulitis.  Appendix appears normal.  There is a minimal ventral hernia containing only fat.  There is no bowel obstruction.  No free air or portal venous air.  There are small retroperitoneal lymph nodes which do not meet size criteria for pathologic significance. By size criteria, there is no adenopathy in the abdomen or pelvis. Aorta is nonaneurysmal. There is a circumaortic left renal vein, an anatomic variant. There are no blastic or lytic bone lesions.  IMPRESSION: A cause for the patient's symptoms has not been established with this study. No bowel obstruction. No bowel wall or mesenteric thickening. No abscess. Appendix appears normal. No renal or ureteral calculus. No hydronephrosis.  There are occasional sigmoid diverticula without inflammation appreciable. There is a minimal ventral hernia containing only fat.   Electronically Signed   By: Bretta Bang III M.D.   On: 09/05/2014 21:26    Impression/Plan: 34 y/o male with h/o bipolar disorder presents with several week history of anorexia, n/v, left sided abdominal pain and postprandial loose stools. Recent admission for psychiatric disease with increase in Zoloft and addition of Abilify.   Patient  reports chronic GERD, uncontrolled on Zantac. Patient denies NSAIDS/ASA or significant etoh use. Recent CT and labs reassuring. Cannot exclude gastritis/PUD/poorly controlled GERD. C/o left sided abd pain, pp loose stools. DDx includes IBS, IBD, less likely infectious.   Discussed possibility of EGD/TCS for further evaluation but patient wanted to avoid invasive/expensive testing given financial restraints.   We will change to PPI. Start Bentyl for possible IBS, Check stool studies. He agrees if no improvement then consider endoscopic evaluation. Will touch base with patient once stool studies results are available.

## 2014-09-16 NOTE — Progress Notes (Signed)
CC'ED TO PCP 

## 2014-09-20 ENCOUNTER — Other Ambulatory Visit: Payer: Self-pay | Admitting: Gastroenterology

## 2014-09-21 LAB — GIARDIA/CRYPTOSPORIDIUM (EIA)
Cryptosporidium Screen (EIA): NEGATIVE
Giardia Screen (EIA): NEGATIVE

## 2014-09-21 LAB — CLOSTRIDIUM DIFFICILE BY PCR: CDIFFPCR: NOT DETECTED

## 2014-09-21 NOTE — Progress Notes (Signed)
Quick Note:  Stools negative so far. Await final culture. ______

## 2014-09-24 LAB — STOOL CULTURE

## 2014-09-26 NOTE — Progress Notes (Signed)
Quick Note:  Please let patient know his stool test indicate no infection. How is he doing on bentyl? Patient previously declined invasive testing due to cost. ______

## 2014-12-02 ENCOUNTER — Other Ambulatory Visit: Payer: Self-pay | Admitting: Gastroenterology

## 2014-12-22 ENCOUNTER — Encounter (HOSPITAL_COMMUNITY): Payer: Self-pay | Admitting: *Deleted

## 2014-12-22 ENCOUNTER — Emergency Department (HOSPITAL_COMMUNITY)
Admission: EM | Admit: 2014-12-22 | Discharge: 2014-12-22 | Disposition: A | Discharge status: Home / Self Care | Payer: 59 | Attending: Emergency Medicine | Admitting: Emergency Medicine

## 2014-12-22 DIAGNOSIS — F909 Attention-deficit hyperactivity disorder, unspecified type: Secondary | ICD-10-CM | POA: Insufficient documentation

## 2014-12-22 DIAGNOSIS — Z91040 Latex allergy status: Secondary | ICD-10-CM | POA: Insufficient documentation

## 2014-12-22 DIAGNOSIS — Z79899 Other long term (current) drug therapy: Secondary | ICD-10-CM | POA: Insufficient documentation

## 2014-12-22 DIAGNOSIS — F17200 Nicotine dependence, unspecified, uncomplicated: Secondary | ICD-10-CM | POA: Insufficient documentation

## 2014-12-22 DIAGNOSIS — Z8739 Personal history of other diseases of the musculoskeletal system and connective tissue: Secondary | ICD-10-CM | POA: Insufficient documentation

## 2014-12-22 DIAGNOSIS — F419 Anxiety disorder, unspecified: Secondary | ICD-10-CM | POA: Insufficient documentation

## 2014-12-22 DIAGNOSIS — F319 Bipolar disorder, unspecified: Secondary | ICD-10-CM | POA: Insufficient documentation

## 2014-12-22 DIAGNOSIS — I10 Essential (primary) hypertension: Secondary | ICD-10-CM | POA: Insufficient documentation

## 2014-12-22 DIAGNOSIS — L259 Unspecified contact dermatitis, unspecified cause: Secondary | ICD-10-CM | POA: Insufficient documentation

## 2014-12-22 MED ORDER — FAMOTIDINE 20 MG PO TABS
20.0000 mg | ORAL_TABLET | Freq: Once | ORAL | Status: AC
  Administered 2014-12-22: 20 mg via ORAL
  Filled 2014-12-22: qty 1

## 2014-12-22 MED ORDER — CETIRIZINE HCL 10 MG PO CAPS: 10.0000 mg | ORAL_CAPSULE | Freq: Every day | ORAL | Status: DC | PRN

## 2014-12-22 MED ORDER — DIPHENHYDRAMINE HCL 50 MG/ML IJ SOLN
50.0000 mg | Freq: Once | INTRAMUSCULAR | Status: AC
  Administered 2014-12-22: 50 mg via INTRAMUSCULAR
  Filled 2014-12-22: qty 1

## 2014-12-22 MED ORDER — PREDNISONE 20 MG PO TABS: ORAL_TABLET | ORAL | Status: DC

## 2014-12-22 MED ORDER — DEXAMETHASONE SODIUM PHOSPHATE 10 MG/ML IJ SOLN
10.0000 mg | Freq: Once | INTRAMUSCULAR | Status: AC
  Administered 2014-12-22: 10 mg via INTRAMUSCULAR
  Filled 2014-12-22: qty 1

## 2014-12-22 MED ORDER — FAMOTIDINE 20 MG PO TABS: 20.0000 mg | ORAL_TABLET | Freq: Two times a day (BID) | ORAL | Status: DC

## 2014-12-22 NOTE — ED Notes (Signed)
Pt c/o red raised rash all over body x 1 week; pt denies any other symptoms, just the rash

## 2014-12-22 NOTE — Discharge Instructions (Signed)
Stay cool, heat will make the rash itch worse. Take the medication as prescribed. You can also take Benadryl 50 mg at bedtime if needed for itching. Recheck if you rash is not improving in the next several days.   Contact Dermatitis Dermatitis is redness, soreness, and swelling (inflammation) of the skin. Contact dermatitis is a reaction to certain substances that touch the skin. You either touched something that irritated your skin, or you have allergies to something you touched.  HOME CARE  Skin Care  Moisturize your skin as needed.  Apply cool compresses to the affected areas.   Try taking a bath with:   Epsom salts. Follow the instructions on the package. You can get these at a pharmacy or grocery store.   Baking soda. Pour a small amount into the bath as told by your doctor.   Colloidal oatmeal. Follow the instructions on the package. You can get this at a pharmacy or grocery store.   Try applying baking soda paste to your skin. Stir water into baking soda until it looks like paste.  Do not scratch your skin.   Bathe less often.  Bathe in lukewarm water. Avoid using hot water.  Medicines  Take or apply over-the-counter and prescription medicines only as told by your doctor.   If you were prescribed an antibiotic medicine, take or apply your antibiotic as told by your doctor. Do not stop taking the antibiotic even if your condition starts to get better. General Instructions  Keep all follow-up visits as told by your doctor. This is important.   Avoid the substance that caused your reaction. If you do not know what caused it, keep a journal to try to track what caused it. Write down:   What you eat.   What cosmetic products you use.   What you drink.   What you wear in the affected area. This includes jewelry.   If you were given a bandage (dressing), take care of it as told by your doctor. This includes when to change and remove it.  GET HELP IF:    You do not get better with treatment.   Your condition gets worse.   You have signs of infection such as:  Swelling.  Tenderness.  Redness.  Soreness.  Warmth.   You have a fever.   You have new symptoms.  GET HELP RIGHT AWAY IF:   You have a very bad headache.  You have neck pain.  Your neck is stiff.   You throw up (vomit).   You feel very sleepy.   You see red streaks coming from the affected area.   Your bone or joint underneath the affected area becomes painful after the skin has healed.   The affected area turns darker.   You have trouble breathing.    This information is not intended to replace advice given to you by your health care provider. Make sure you discuss any questions you have with your health care provider.   Document Released: 11/12/2008 Document Revised: 10/06/2014 Document Reviewed: 06/02/2014 Elsevier Interactive Patient Education [employer]2016 Elsevier Inc.

## 2014-12-22 NOTE — ED Provider Notes (Signed)
CSN: [id number]     Arrival date & time 12/22/14  0042 History   First MD Initiated Contact with Patient 12/22/14 0150     Chief Complaint  Patient presents with  . Rash     (Consider location/radiation/quality/duration/timing/severity/associated sxs/prior Treatment) HPI patient states about a week ago he started getting a rash is started on his abdomen and now has spread to his legs with a few lesions on his hands and on his back. He states it itches and gets worse when he gets hot. He denies any fever, lesions in his mouth, having difficulty swallowing or breathing, joint aches. He denies any change in his medication. He states this started after he been outside raking leaves. He is not sure if he was in contact with any type of vines.    PCP Dr Juanetta GoslingHawkins  Past Medical History  Diagnosis Date  . Hypertension   . Depression   . Anxiety   . Dizziness   . Lightheaded   . Headache   . Cervical radiculopathy   . Vertigo   . Bipolar disorder (HCC)   . ADHD (attention deficit hyperactivity disorder)   . Low back pain    Past Surgical History  Procedure Laterality Date  . Wisdom tooth extraction    . Knee surgery Right    Family History  Problem Relation Age of Onset  . Adopted: Yes  . High blood pressure Mother   . Seizures Mother    Social History  Substance Use Topics  . Smoking status: Current Every Day Smoker -- 1.00 packs/day  . Smokeless tobacco: Current User  . Alcohol Use: 1.2 oz/week    2 Cans of beer per week     Comment: heavy in past, short period of time in 1220s.   on disability for "mental"  Review of Systems  All other systems reviewed and are negative.     Allergies  Pineapple; Latex; and Morphine and related  Home Medications   Prior to Admission medications   Medication Sig Start Date End Date Taking? Authorizing Provider  ABILIFY 10 MG tablet Take 10 mg by mouth daily.  08/11/14  Yes Historical Provider, MD  amantadine (SYMMETREL) 100 MG  capsule Take 100 mg by mouth daily.   Yes Historical Provider, MD  clonazePAM (KLONOPIN) 0.5 MG tablet Take 0.5 mg by mouth 2 (two) times daily.   Yes Historical Provider, MD  dicyclomine (BENTYL) 10 MG capsule TAKE ONE CAPSULE BY MOUTH 4 TIMES DAILY BEFORE  MEALS AND AT BEDTIME AS NEEDED FOR DIARRHEA.  HOLD FOR CONSTIPATION. 12/03/14  Yes Tiffany KocherLeslie S Lewis, PA-C  lisinopril (PRINIVIL,ZESTRIL) 10 MG tablet Take 10 mg by mouth daily.   Yes Historical Provider, MD  pantoprazole (PROTONIX) 40 MG tablet Take 1 tablet (40 mg total) by mouth daily before breakfast. 09/15/14  Yes Tiffany KocherLeslie S Lewis, PA-C  ranitidine (ZANTAC) 150 MG capsule Take 150 mg by mouth daily.   Yes Historical Provider, MD  ZOLOFT 100 MG tablet Take 100 mg by mouth daily.  08/11/14  Yes Historical Provider, MD  Cetirizine HCl (ZYRTEC ALLERGY) 10 MG CAPS Take 1 capsule (10 mg total) by mouth daily as needed (for itching). 12/22/14   Devoria AlbeIva Selita Staiger, MD  famotidine (PEPCID) 20 MG tablet Take 1 tablet (20 mg total) by mouth 2 (two) times daily. 12/22/14   Devoria AlbeIva Jazmina Muhlenkamp, MD  predniSONE (DELTASONE) 20 MG tablet Take 3 po QD x 3d , then 2 po QD x 3d then 1 po QD  x 3d 12/22/14   Devoria Albe, MD   BP 130/86 mmHg  Pulse 86  Temp(Src) 98.5 F (36.9 C)  Resp 18  Ht  (1.778 m)  Wt 210 lb (95.255 kg)  BMI 30.13 kg/m2  SpO2 100%  Vital signs normal   Physical Exam  Constitutional: He is oriented to person, place, and time. He appears well-developed and well-nourished.  Non-toxic appearance. He does not appear ill. No distress.  HENT:  Head: Normocephalic and atraumatic.  Right Ear: External ear normal.  Left Ear: External ear normal.  Nose: Nose normal. No mucosal edema or rhinorrhea.  Mouth/Throat: Oropharynx is clear and moist and mucous membranes are normal. No dental abscesses or uvula swelling.  Eyes: Conjunctivae and EOM are normal. Pupils are equal, round, and reactive to light.  Neck: Normal range of motion and full passive range of motion  without pain. Neck supple.  Pulmonary/Chest: Effort normal and breath sounds normal. No respiratory distress. He has no wheezes. He has no rhonchi. He has no rales. He exhibits no tenderness and no crepitus.  Abdominal: Normal appearance.  Musculoskeletal: Normal range of motion. He exhibits no edema or tenderness.  Moves all extremities well.   Neurological: He is alert and oriented to person, place, and time. He has normal strength. No cranial nerve deficit.  Skin: Skin is warm, dry and intact. Rash noted. No erythema. No pallor.  Patient is noted to have scattered red lesions some with vesicles heavily clustered on his abdomen and his thighs bilaterally with a few lesions on his lower legs. He has rare lesions on his hands. His interdigital webspaces are spared. He has rare lesions on his back.  Psychiatric: He has a normal mood and affect. His speech is normal and behavior is normal. His mood appears not anxious.  Nursing note and vitals reviewed.         ED Course  Procedures (including critical care time)  Medications  dexamethasone (DECADRON) injection 10 mg (10 mg Intramuscular Given 12/22/14 0221)  diphenhydrAMINE (BENADRYL) injection 50 mg (50 mg Intramuscular Given 12/22/14 0221)  famotidine (PEPCID) tablet 20 mg (20 mg Oral Given 12/22/14 0221)      MDM   Final diagnoses:  Contact dermatitis   New Prescriptions   CETIRIZINE HCL (ZYRTEC ALLERGY) 10 MG CAPS    Take 1 capsule (10 mg total) by mouth daily as needed (for itching).   FAMOTIDINE (PEPCID) 20 MG TABLET    Take 1 tablet (20 mg total) by mouth 2 (two) times daily.   PREDNISONE (DELTASONE) 20 MG TABLET    Take 3 po QD x 3d , then 2 po QD x 3d then 1 po QD x 3d    Plan discharge  Devoria Albe, MD, Concha Pyo, MD 12/22/14 [phone]

## 2015-06-24 ENCOUNTER — Other Ambulatory Visit: Payer: Self-pay | Admitting: Gastroenterology

## 2015-06-28 ENCOUNTER — Telehealth: Payer: Self-pay | Admitting: Internal Medicine

## 2015-06-28 NOTE — Telephone Encounter (Signed)
This medication was sent in to the pharmacy this morning. Pt should check with the pharmacy.

## 2015-06-28 NOTE — Telephone Encounter (Signed)
Pt called to say that he needed a refill on his dicyclomine and that [facility] had tried to reach us on Friday and couldn't. I told him that we close at noon on Fridays and that's probably why. Please advise and call with any questions to [phone] or [phone]

## 2015-09-12 ENCOUNTER — Other Ambulatory Visit: Payer: Self-pay | Admitting: Gastroenterology

## 2015-09-13 ENCOUNTER — Other Ambulatory Visit: Payer: Self-pay

## 2015-09-14 MED ORDER — PANTOPRAZOLE SODIUM 40 MG PO TBEC: 40.0000 mg | DELAYED_RELEASE_TABLET | Freq: Every day | ORAL | 3 refills | Status: DC

## 2015-12-23 ENCOUNTER — Other Ambulatory Visit: Payer: Self-pay | Admitting: Gastroenterology

## 2016-01-16 ENCOUNTER — Ambulatory Visit (INDEPENDENT_AMBULATORY_CARE_PROVIDER_SITE_OTHER): Payer: Self-pay | Admitting: Otolaryngology

## 2016-01-16 DIAGNOSIS — H900 Conductive hearing loss, bilateral: Secondary | ICD-10-CM

## 2016-01-16 DIAGNOSIS — H6983 Other specified disorders of Eustachian tube, bilateral: Secondary | ICD-10-CM

## 2016-02-06 ENCOUNTER — Ambulatory Visit (INDEPENDENT_AMBULATORY_CARE_PROVIDER_SITE_OTHER): Payer: Self-pay | Admitting: Otolaryngology

## 2016-02-06 DIAGNOSIS — H900 Conductive hearing loss, bilateral: Secondary | ICD-10-CM

## 2016-02-06 DIAGNOSIS — H6983 Other specified disorders of Eustachian tube, bilateral: Secondary | ICD-10-CM

## 2016-02-16 ENCOUNTER — Ambulatory Visit (INDEPENDENT_AMBULATORY_CARE_PROVIDER_SITE_OTHER): Payer: Self-pay | Admitting: Otolaryngology

## 2016-02-16 DIAGNOSIS — H6983 Other specified disorders of Eustachian tube, bilateral: Secondary | ICD-10-CM

## 2016-02-16 DIAGNOSIS — J310 Chronic rhinitis: Secondary | ICD-10-CM

## 2016-02-16 DIAGNOSIS — J343 Hypertrophy of nasal turbinates: Secondary | ICD-10-CM

## 2016-02-16 DIAGNOSIS — H6523 Chronic serous otitis media, bilateral: Secondary | ICD-10-CM

## 2016-02-16 DIAGNOSIS — H900 Conductive hearing loss, bilateral: Secondary | ICD-10-CM

## 2016-03-01 DIAGNOSIS — F4011 Social phobia, generalized: Secondary | ICD-10-CM | POA: Diagnosis not present

## 2016-03-01 DIAGNOSIS — F902 Attention-deficit hyperactivity disorder, combined type: Secondary | ICD-10-CM | POA: Diagnosis not present

## 2016-03-01 DIAGNOSIS — F3132 Bipolar disorder, current episode depressed, moderate: Secondary | ICD-10-CM | POA: Diagnosis not present

## 2016-03-15 ENCOUNTER — Ambulatory Visit (INDEPENDENT_AMBULATORY_CARE_PROVIDER_SITE_OTHER): Payer: Medicare Other | Admitting: Otolaryngology

## 2016-03-15 DIAGNOSIS — H7203 Central perforation of tympanic membrane, bilateral: Secondary | ICD-10-CM | POA: Diagnosis not present

## 2016-03-15 DIAGNOSIS — H903 Sensorineural hearing loss, bilateral: Secondary | ICD-10-CM | POA: Diagnosis not present

## 2016-03-15 DIAGNOSIS — H6983 Other specified disorders of Eustachian tube, bilateral: Secondary | ICD-10-CM | POA: Diagnosis not present

## 2016-03-19 DIAGNOSIS — F319 Bipolar disorder, unspecified: Secondary | ICD-10-CM | POA: Diagnosis not present

## 2016-03-19 DIAGNOSIS — M545 Low back pain: Secondary | ICD-10-CM | POA: Diagnosis not present

## 2016-03-19 DIAGNOSIS — E669 Obesity, unspecified: Secondary | ICD-10-CM | POA: Diagnosis not present

## 2016-03-19 DIAGNOSIS — I10 Essential (primary) hypertension: Secondary | ICD-10-CM | POA: Diagnosis not present

## 2016-03-20 DIAGNOSIS — I10 Essential (primary) hypertension: Secondary | ICD-10-CM | POA: Diagnosis not present

## 2016-03-20 DIAGNOSIS — H6693 Otitis media, unspecified, bilateral: Secondary | ICD-10-CM | POA: Diagnosis not present

## 2016-03-20 DIAGNOSIS — M545 Low back pain: Secondary | ICD-10-CM | POA: Diagnosis not present

## 2016-03-20 DIAGNOSIS — F319 Bipolar disorder, unspecified: Secondary | ICD-10-CM | POA: Diagnosis not present

## 2016-04-26 DIAGNOSIS — F902 Attention-deficit hyperactivity disorder, combined type: Secondary | ICD-10-CM | POA: Diagnosis not present

## 2016-04-26 DIAGNOSIS — F3132 Bipolar disorder, current episode depressed, moderate: Secondary | ICD-10-CM | POA: Diagnosis not present

## 2016-04-26 DIAGNOSIS — F4011 Social phobia, generalized: Secondary | ICD-10-CM | POA: Diagnosis not present

## 2016-06-21 DIAGNOSIS — F3132 Bipolar disorder, current episode depressed, moderate: Secondary | ICD-10-CM | POA: Diagnosis not present

## 2016-06-21 DIAGNOSIS — F902 Attention-deficit hyperactivity disorder, combined type: Secondary | ICD-10-CM | POA: Diagnosis not present

## 2016-06-21 DIAGNOSIS — F4011 Social phobia, generalized: Secondary | ICD-10-CM | POA: Diagnosis not present

## 2016-07-12 ENCOUNTER — Other Ambulatory Visit: Payer: Self-pay | Admitting: Gastroenterology

## 2016-07-12 NOTE — Telephone Encounter (Signed)
Needs ov 08/2016

## 2016-07-16 ENCOUNTER — Encounter: Payer: Self-pay | Admitting: Gastroenterology

## 2016-07-17 DIAGNOSIS — E669 Obesity, unspecified: Secondary | ICD-10-CM | POA: Diagnosis not present

## 2016-07-17 DIAGNOSIS — F319 Bipolar disorder, unspecified: Secondary | ICD-10-CM | POA: Diagnosis not present

## 2016-07-17 DIAGNOSIS — I10 Essential (primary) hypertension: Secondary | ICD-10-CM | POA: Diagnosis not present

## 2016-07-17 DIAGNOSIS — M545 Low back pain: Secondary | ICD-10-CM | POA: Diagnosis not present

## 2016-08-16 DIAGNOSIS — F3132 Bipolar disorder, current episode depressed, moderate: Secondary | ICD-10-CM | POA: Diagnosis not present

## 2016-08-16 DIAGNOSIS — F4011 Social phobia, generalized: Secondary | ICD-10-CM | POA: Diagnosis not present

## 2016-08-16 DIAGNOSIS — F902 Attention-deficit hyperactivity disorder, combined type: Secondary | ICD-10-CM | POA: Diagnosis not present

## 2016-09-12 ENCOUNTER — Other Ambulatory Visit: Payer: Self-pay | Admitting: Gastroenterology

## 2016-09-17 ENCOUNTER — Encounter: Payer: Self-pay | Admitting: Gastroenterology

## 2016-09-17 ENCOUNTER — Ambulatory Visit (INDEPENDENT_AMBULATORY_CARE_PROVIDER_SITE_OTHER): Payer: Medicare Other | Admitting: Gastroenterology

## 2016-09-17 VITALS — BP 130/76 | HR 114 | Temp 98.4°F | Ht 70.0 in | Wt 251.4 lb

## 2016-09-17 DIAGNOSIS — R197 Diarrhea, unspecified: Secondary | ICD-10-CM | POA: Diagnosis not present

## 2016-09-17 DIAGNOSIS — K219 Gastro-esophageal reflux disease without esophagitis: Secondary | ICD-10-CM | POA: Diagnosis not present

## 2016-09-17 MED ORDER — DICYCLOMINE HCL 10 MG PO CAPS: ORAL_CAPSULE | ORAL | 11 refills | Status: DC

## 2016-09-17 NOTE — Progress Notes (Signed)
      Primary Care Physician: Kari Baars, MD  Primary Gastroenterologist:  Roetta Sessions, MD   Chief Complaint  Patient presents with  . Gastroesophageal Reflux    f/u, doing ok    HPI: Bobby Jimenez is a 36 y.o. male here for follow-up for further refills. He has a history of GERD, probable IBS. Since we last saw him he has gained 40 pounds. Can't miss His pantoprazole or he will have recurrent heartburn and vomiting. Denies dysphagia. Generally 2 bowel movements a day as long as he takes the dicyclomine 3-4 times daily. Helps with abdominal cramps and loose stools. No melena rectal bleeding. No nocturnal diarrhea.  Current Outpatient Prescriptions  Medication Sig Dispense Refill  . ABILIFY 10 MG tablet Take 5 mg by mouth 2 (two) times daily.     Marland Kitchen amantadine (SYMMETREL) 100 MG capsule Take 100 mg by mouth 2 (two) times daily.     . Cetirizine HCl (ZYRTEC ALLERGY) 10 MG CAPS Take 1 capsule (10 mg total) by mouth daily as needed (for itching). 14 capsule 0  . clonazePAM (KLONOPIN) 0.5 MG tablet Take 0.5 mg by mouth 2 (two) times daily.    Marland Kitchen dicyclomine (BENTYL) 10 MG capsule TAKE 1 CAPSULE BY MOUTH 4 TIMES DAILY WITH MEALS AND AT BEDTIME AS NEEDED. HOLD FOR CONSTIPATION. 120 capsule 3  . lisinopril (PRINIVIL,ZESTRIL) 10 MG tablet Take 10 mg by mouth daily.    . pantoprazole (PROTONIX) 40 MG tablet TAKE 1 TABLET BY MOUTH ONCE DAILY BEFORE BREAKFAST. 90 tablet 0  . ZOLOFT 100 MG tablet Take 150 mg by mouth daily.      No current facility-administered medications for this visit.     Allergies as of 09/17/2016 - Review Complete 09/17/2016  Allergen Reaction Noted  . Pineapple Shortness Of Breath and Swelling 05/14/2014  . Latex Swelling 05/14/2014  . Morphine and related Hives and Itching 05/14/2014    ROS:  General: Negative for anorexia, weight loss, fever, chills, fatigue, weakness. ENT: Negative for hoarseness, difficulty swallowing , nasal congestion. CV: Negative  for chest pain, angina, palpitations, dyspnea on exertion, peripheral edema.  Respiratory: Negative for dyspnea at rest, dyspnea on exertion, cough, sputum, wheezing.  GI: See history of present illness. GU:  Negative for dysuria, hematuria, urinary incontinence, urinary frequency, nocturnal urination.  Endo: Negative for unusual weight change.    Physical Examination:   BP 130/76   Pulse (!) 114   Temp 98.4 F (36.9 C) (Oral)   Ht 5\' 10"  (1.778 m)   Wt 251 lb 6.4 oz (114 kg)   BMI 36.07 kg/m   General: Well-nourished, well-developed in no acute distress.  Eyes: No icterus. Mouth: Oropharyngeal mucosa moist and pink , no lesions erythema or exudate. Lungs: Clear to auscultation bilaterally.  Heart: Regular rate and rhythm, no murmurs rubs or gallops.  Abdomen: Bowel sounds are normal, nontender, nondistended, no hepatosplenomegaly or masses, no abdominal bruits or hernia , no rebound or guarding.   Extremities: No lower extremity edema. No clubbing or deformities. Neuro: Alert and oriented x 4   Skin: Warm and dry, no jaundice.   Psych: Alert and cooperative, normal mood and affect.

## 2016-09-17 NOTE — Assessment & Plan Note (Signed)
Doing well on Protonix. Encouraged weight loss. He's gained 40 pounds in the past 2 years. He plans to discuss further with Dr. Juanetta Gosling, has been an ongoing conversation. Handout for calorie counting provided. Return to the office in 2 years. Call sooner if needed.

## 2016-09-17 NOTE — Patient Instructions (Signed)
1. Continue pantoprazole and bentyl as before.  2. We will see you back in two years or call sooner if needed.    Calorie Counting for Weight Loss Calories are units of energy. Your body needs a certain amount of calories from food to keep you going throughout the day. When you eat more calories than your body needs, your body stores the extra calories as fat. When you eat fewer calories than your body needs, your body burns fat to get the energy it needs. Calorie counting means keeping track of how many calories you eat and drink each day. Calorie counting can be helpful if you need to lose weight. If you make sure to eat fewer calories than your body needs, you should lose weight. Ask your health care provider what a healthy weight is for you. For calorie counting to work, you will need to eat the right number of calories in a day in order to lose a healthy amount of weight per week. A dietitian can help you determine how many calories you need in a day and will give you suggestions on how to reach your calorie goal.  A healthy amount of weight to lose per week is usually 1-2 lb (0.5-0.9 kg). This usually means that your daily calorie intake should be reduced by 500-750 calories.  Eating 1,200 - 1,500 calories per day can help most women lose weight.  Eating 1,500 - 1,800 calories per day can help most men lose weight.  What is my plan? My goal is to have ______1800____ calories per day. If I have this many calories per day, I should lose around ______1-2____ pounds per week. What do I need to know about calorie counting? In order to meet your daily calorie goal, you will need to:  Find out how many calories are in each food you would like to eat. Try to do this before you eat.  Decide how much of the food you plan to eat.  Write down what you ate and how many calories it had. Doing this is called keeping a food log.  To successfully lose weight, it is important to balance calorie  counting with a healthy lifestyle that includes regular activity. Aim for 150 minutes of moderate exercise (such as walking) or 75 minutes of vigorous exercise (such as running) each week. Where do I find calorie information?  The number of calories in a food can be found on a Nutrition Facts label. If a food does not have a Nutrition Facts label, try to look up the calories online or ask your dietitian for help. Remember that calories are listed per serving. If you choose to have more than one serving of a food, you will have to multiply the calories per serving by the amount of servings you plan to eat. For example, the label on a package of bread might say that a serving size is 1 slice and that there are 90 calories in a serving. If you eat 1 slice, you will have eaten 90 calories. If you eat 2 slices, you will have eaten 180 calories. How do I keep a food log? Immediately after each meal, record the following information in your food log:  What you ate. Don't forget to include toppings, sauces, and other extras on the food.  How much you ate. This can be measured in cups, ounces, or number of items.  How many calories each food and drink had.  The total number of calories in the  meal.  Keep your food log near you, such as in a small notebook in your pocket, or use a mobile app or website. Some programs will calculate calories for you and show you how many calories you have left for the day to meet your goal. What are some calorie counting tips?  Use your calories on foods and drinks that will fill you up and not leave you hungry: ? Some examples of foods that fill you up are nuts and nut butters, vegetables, lean proteins, and high-fiber foods like whole grains. High-fiber foods are foods with more than 5 g fiber per serving. ? Drinks such as sodas, specialty coffee drinks, alcohol, and juices have a lot of calories, yet do not fill you up.  Eat nutritious foods and avoid empty calories.  Empty calories are calories you get from foods or beverages that do not have many vitamins or protein, such as candy, sweets, and soda. It is better to have a nutritious high-calorie food (such as an avocado) than a food with few nutrients (such as a bag of chips).  Know how many calories are in the foods you eat most often. This will help you calculate calorie counts faster.  Pay attention to calories in drinks. Low-calorie drinks include water and unsweetened drinks.  Pay attention to nutrition labels for "low fat" or "fat free" foods. These foods sometimes have the same amount of calories or more calories than the full fat versions. They also often have added sugar, starch, or salt, to make up for flavor that was removed with the fat.  Find a way of tracking calories that works for you. Get creative. Try different apps or programs if writing down calories does not work for you. What are some portion control tips?  Know how many calories are in a serving. This will help you know how many servings of a certain food you can have.  Use a measuring cup to measure serving sizes. You could also try weighing out portions on a kitchen scale. With time, you will be able to estimate serving sizes for some foods.  Take some time to put servings of different foods on your favorite plates, bowls, and cups so you know what a serving looks like.  Try not to eat straight from a bag or box. Doing this can lead to overeating. Put the amount you would like to eat in a cup or on a plate to make sure you are eating the right portion.  Use smaller plates, glasses, and bowls to prevent overeating.  Try not to multitask (for example, watch TV or use your computer) while eating. If it is time to eat, sit down at a table and enjoy your food. This will help you to know when you are full. It will also help you to be aware of what you are eating and how much you are eating. What are tips for following this plan? Reading  food labels  Check the calorie count compared to the serving size. The serving size may be smaller than what you are used to eating.  Check the source of the calories. Make sure the food you are eating is high in vitamins and protein and low in saturated and trans fats. Shopping  Read nutrition labels while you shop. This will help you make healthy decisions before you decide to purchase your food.  Make a grocery list and stick to it. Cooking  Try to cook your favorite foods in a healthier way.  For example, try baking instead of frying.  Use low-fat dairy products. Meal planning  Use more fruits and vegetables. Half of your plate should be fruits and vegetables.  Include lean proteins like poultry and fish. How do I count calories when eating out?  Ask for smaller portion sizes.  Consider sharing an entree and sides instead of getting your own entree.  If you get your own entree, eat only half. Ask for a box at the beginning of your meal and put the rest of your entree in it so you are not tempted to eat it.  If calories are listed on the menu, choose the lower calorie options.  Choose dishes that include vegetables, fruits, whole grains, low-fat dairy products, and lean protein.  Choose items that are boiled, broiled, grilled, or steamed. Stay away from items that are buttered, battered, fried, or served with cream sauce. Items labeled "crispy" are usually fried, unless stated otherwise.  Choose water, low-fat milk, unsweetened iced tea, or other drinks without added sugar. If you want an alcoholic beverage, choose a lower calorie option such as a glass of wine or light beer.  Ask for dressings, sauces, and syrups on the side. These are usually high in calories, so you should limit the amount you eat.  If you want a salad, choose a garden salad and ask for grilled meats. Avoid extra toppings like bacon, cheese, or fried items. Ask for the dressing on the side, or ask for olive  oil and vinegar or lemon to use as dressing.  Estimate how many servings of a food you are given. For example, a serving of cooked rice is  cup or about the size of half a baseball. Knowing serving sizes will help you be aware of how much food you are eating at restaurants. The list below tells you how big or small some common portion sizes are based on everyday objects: ? 1 oz-4 stacked dice. ? 3 oz-1 deck of cards. ? 1 tsp-1 die. ? 1 Tbsp- a ping-pong ball. ? 2 Tbsp-1 ping-pong ball. ?  cup- baseball. ? 1 cup-1 baseball. Summary  Calorie counting means keeping track of how many calories you eat and drink each day. If you eat fewer calories than your body needs, you should lose weight.  A healthy amount of weight to lose per week is usually 1-2 lb (0.5-0.9 kg). This usually means reducing your daily calorie intake by 500-750 calories.  The number of calories in a food can be found on a Nutrition Facts label. If a food does not have a Nutrition Facts label, try to look up the calories online or ask your dietitian for help.  Use your calories on foods and drinks that will fill you up, and not on foods and drinks that will leave you hungry.  Use smaller plates, glasses, and bowls to prevent overeating. This information is not intended to replace advice given to you by your health care provider. Make sure you discuss any questions you have with your health care provider. Document Released: 01/15/2005 Document Revised: 12/16/2015 Document Reviewed: 12/16/2015 Elsevier Interactive Patient Education  2017 [employer].

## 2016-09-17 NOTE — Assessment & Plan Note (Signed)
Doing well on dicyclomine. Rx renewed. Return to the office in 2 years or sooner if needed.

## 2016-09-17 NOTE — Progress Notes (Signed)
cc'ed to pcp °

## 2016-09-20 ENCOUNTER — Ambulatory Visit (INDEPENDENT_AMBULATORY_CARE_PROVIDER_SITE_OTHER): Payer: Medicare Other | Admitting: Otolaryngology

## 2016-09-20 DIAGNOSIS — H6983 Other specified disorders of Eustachian tube, bilateral: Secondary | ICD-10-CM | POA: Diagnosis not present

## 2016-09-20 DIAGNOSIS — H7203 Central perforation of tympanic membrane, bilateral: Secondary | ICD-10-CM

## 2016-09-20 DIAGNOSIS — G4730 Sleep apnea, unspecified: Secondary | ICD-10-CM

## 2016-09-25 ENCOUNTER — Other Ambulatory Visit (HOSPITAL_BASED_OUTPATIENT_CLINIC_OR_DEPARTMENT_OTHER): Payer: Self-pay

## 2016-09-25 DIAGNOSIS — G4730 Sleep apnea, unspecified: Secondary | ICD-10-CM

## 2016-09-29 HISTORY — PX: OTHER SURGICAL HISTORY: SHX169

## 2016-10-11 DIAGNOSIS — F902 Attention-deficit hyperactivity disorder, combined type: Secondary | ICD-10-CM | POA: Diagnosis not present

## 2016-10-11 DIAGNOSIS — F3132 Bipolar disorder, current episode depressed, moderate: Secondary | ICD-10-CM | POA: Diagnosis not present

## 2016-10-11 DIAGNOSIS — F4011 Social phobia, generalized: Secondary | ICD-10-CM | POA: Diagnosis not present

## 2016-10-15 DIAGNOSIS — I10 Essential (primary) hypertension: Secondary | ICD-10-CM | POA: Diagnosis not present

## 2016-10-15 DIAGNOSIS — E669 Obesity, unspecified: Secondary | ICD-10-CM | POA: Diagnosis not present

## 2016-10-15 DIAGNOSIS — F319 Bipolar disorder, unspecified: Secondary | ICD-10-CM | POA: Diagnosis not present

## 2016-10-15 DIAGNOSIS — M545 Low back pain: Secondary | ICD-10-CM | POA: Diagnosis not present

## 2016-10-16 ENCOUNTER — Ambulatory Visit: Payer: Medicare Other | Attending: Otolaryngology | Admitting: Neurology

## 2016-10-16 DIAGNOSIS — G4730 Sleep apnea, unspecified: Secondary | ICD-10-CM | POA: Insufficient documentation

## 2016-10-16 DIAGNOSIS — G4733 Obstructive sleep apnea (adult) (pediatric): Secondary | ICD-10-CM

## 2016-10-16 HISTORY — DX: Obstructive sleep apnea (adult) (pediatric): G47.33

## 2016-10-28 NOTE — Procedures (Signed)
[facility] Zerline Melchior A. Gerilyn Pilgrim, MD     [email or web address]             NOCTURNAL POLYSOMNOGRAPHY   LOCATION: [facility]    Patient Name: Bobby Jimenez, Bobby Jimenez Date: 10/16/2016 Gender: Male D.O.B: 1980-06-09 Age (years): 35 Referring Provider: Newman Pies Height (inches): 70 Interpreting Physician: Beryle Beams MD, ABSM Weight (lbs): 251 RPSGT: Alfonso Ellis BMI: 36 MRN: [id number] Neck Size: 19.50 CLINICAL INFORMATION Sleep Study Type: NPSG  Indication for sleep study: N/A  Epworth Sleepiness Score: 0  SLEEP STUDY TECHNIQUE As per the AASM Manual for the Scoring of Sleep and Associated Events v2.3 (April 2016) with a hypopnea requiring 4% desaturations.  The channels recorded and monitored were frontal, central and occipital EEG, electrooculogram (EOG), submentalis EMG (chin), nasal and oral airflow, thoracic and abdominal wall motion, anterior tibialis EMG, snore microphone, electrocardiogram, and pulse oximetry.  MEDICATIONS Medications self-administered by patient taken the night of the study : DICYCLOMINE, AMANTADINE, ARIPIPRAZOLE, CLONAZEPAM, TOPIRAMATE  Current Outpatient Prescriptions:  .  ABILIFY 10 MG tablet, Take 5 mg by mouth 2 (two) times daily. , Disp: , Rfl:  .  amantadine (SYMMETREL) 100 MG capsule, Take 100 mg by mouth 2 (two) times daily. , Disp: , Rfl:  .  Cetirizine HCl (ZYRTEC ALLERGY) 10 MG CAPS, Take 1 capsule (10 mg total) by mouth daily as needed (for itching)., Disp: 14 capsule, Rfl: 0 .  clonazePAM (KLONOPIN) 0.5 MG tablet, Take 0.5 mg by mouth 2 (two) times daily., Disp: , Rfl:  .  dicyclomine (BENTYL) 10 MG capsule, TAKE 1 CAPSULE BY MOUTH 4 TIMES DAILY WITH MEALS AND AT BEDTIME AS NEEDED. HOLD FOR CONSTIPATION., Disp: 120 capsule, Rfl: 11 .  lisinopril (PRINIVIL,ZESTRIL) 10 MG tablet, Take 10 mg by mouth daily., Disp: , Rfl:  .  pantoprazole (PROTONIX) 40 MG tablet, TAKE 1 TABLET BY MOUTH ONCE DAILY BEFORE BREAKFAST., Disp: 90  tablet, Rfl: 0 .  ZOLOFT 100 MG tablet, Take 150 mg by mouth daily. , Disp: , Rfl:    SLEEP ARCHITECTURE The study was initiated at 10:10:50 PM and ended at 4:45:43 AM.  Sleep onset time was 64.8 minutes and the sleep efficiency was 70.9%. The total sleep time was 280.1 minutes.  Stage REM latency was 290.0 minutes.  The patient spent 11.78% of the night in stage N1 sleep, 74.83% in stage N2 sleep, 6.43% in stage N3 and 6.96% in REM.  Alpha intrusion was absent.  Supine sleep was 0.00%.  RESPIRATORY PARAMETERS The overall apnea/hypopnea index (AHI) was 13.1 per hour. There were 0 total apneas, including 0 obstructive, 0 central and 0 mixed apneas. There were 61 hypopneas and 0 RERAs.  The AHI during Stage REM sleep was 49.2 per hour.  AHI while supine was N/A per hour.  The mean oxygen saturation was 93.79%. The minimum SpO2 during sleep was 88.00%.  loud snoring was noted during this study.  CARDIAC DATA The 2 lead EKG demonstrated sinus rhythm. The mean heart rate was N/A beats per minute. Other EKG findings include: None. LEG MOVEMENT DATA The total PLMS were 1 with a resulting PLMS index of 0.21. Associated arousal with leg movement index was 0.0.  IMPRESSIONS - Mild to moderate obstructive sleep apnea worse during REM sleep is documented. A trial of autoPAP 8-14 is suggested.   Argie Ramming, MD Diplomate, American Board of Sleep Medicine.  ELECTRONICALLY SIGNED ON:  10/28/2016, 5:21 PM [facility] PH: [phone]   FX: [phone]  [phone] ACCREDITED BY THE AMERICAN ACADEMY OF SLEEP MEDICINE  

## 2016-12-13 ENCOUNTER — Other Ambulatory Visit: Payer: Self-pay | Admitting: Gastroenterology

## 2016-12-13 DIAGNOSIS — F902 Attention-deficit hyperactivity disorder, combined type: Secondary | ICD-10-CM | POA: Diagnosis not present

## 2016-12-13 DIAGNOSIS — F3132 Bipolar disorder, current episode depressed, moderate: Secondary | ICD-10-CM | POA: Diagnosis not present

## 2016-12-13 DIAGNOSIS — F4011 Social phobia, generalized: Secondary | ICD-10-CM | POA: Diagnosis not present

## 2016-12-23 ENCOUNTER — Inpatient Hospital Stay (HOSPITAL_COMMUNITY)
Admission: EM | Admit: 2016-12-23 | Discharge: 2016-12-27 | DRG: 854 | Disposition: A | Discharge status: Home / Self Care | Payer: Medicare Other | Attending: Pulmonary Disease | Admitting: Pulmonary Disease

## 2016-12-23 ENCOUNTER — Inpatient Hospital Stay (HOSPITAL_COMMUNITY): Payer: Medicare Other

## 2016-12-23 ENCOUNTER — Emergency Department (HOSPITAL_COMMUNITY): Payer: Medicare Other

## 2016-12-23 ENCOUNTER — Other Ambulatory Visit: Payer: Self-pay

## 2016-12-23 ENCOUNTER — Encounter (HOSPITAL_COMMUNITY): Payer: Self-pay

## 2016-12-23 DIAGNOSIS — Z8249 Family history of ischemic heart disease and other diseases of the circulatory system: Secondary | ICD-10-CM | POA: Diagnosis not present

## 2016-12-23 DIAGNOSIS — F17210 Nicotine dependence, cigarettes, uncomplicated: Secondary | ICD-10-CM | POA: Diagnosis not present

## 2016-12-23 DIAGNOSIS — A419 Sepsis, unspecified organism: Secondary | ICD-10-CM | POA: Diagnosis not present

## 2016-12-23 DIAGNOSIS — E876 Hypokalemia: Secondary | ICD-10-CM | POA: Diagnosis not present

## 2016-12-23 DIAGNOSIS — K8000 Calculus of gallbladder with acute cholecystitis without obstruction: Secondary | ICD-10-CM | POA: Diagnosis not present

## 2016-12-23 DIAGNOSIS — J9811 Atelectasis: Secondary | ICD-10-CM | POA: Diagnosis not present

## 2016-12-23 DIAGNOSIS — Z885 Allergy status to narcotic agent status: Secondary | ICD-10-CM | POA: Diagnosis not present

## 2016-12-23 DIAGNOSIS — I10 Essential (primary) hypertension: Secondary | ICD-10-CM | POA: Diagnosis not present

## 2016-12-23 DIAGNOSIS — E869 Volume depletion, unspecified: Secondary | ICD-10-CM | POA: Diagnosis not present

## 2016-12-23 DIAGNOSIS — Z91040 Latex allergy status: Secondary | ICD-10-CM

## 2016-12-23 DIAGNOSIS — I471 Supraventricular tachycardia: Secondary | ICD-10-CM | POA: Diagnosis not present

## 2016-12-23 DIAGNOSIS — E871 Hypo-osmolality and hyponatremia: Secondary | ICD-10-CM | POA: Diagnosis present

## 2016-12-23 DIAGNOSIS — F419 Anxiety disorder, unspecified: Secondary | ICD-10-CM | POA: Diagnosis not present

## 2016-12-23 DIAGNOSIS — K760 Fatty (change of) liver, not elsewhere classified: Secondary | ICD-10-CM | POA: Diagnosis not present

## 2016-12-23 DIAGNOSIS — F319 Bipolar disorder, unspecified: Secondary | ICD-10-CM | POA: Diagnosis not present

## 2016-12-23 DIAGNOSIS — K810 Acute cholecystitis: Secondary | ICD-10-CM | POA: Diagnosis not present

## 2016-12-23 DIAGNOSIS — K219 Gastro-esophageal reflux disease without esophagitis: Secondary | ICD-10-CM | POA: Diagnosis present

## 2016-12-23 DIAGNOSIS — I959 Hypotension, unspecified: Secondary | ICD-10-CM | POA: Diagnosis present

## 2016-12-23 DIAGNOSIS — J189 Pneumonia, unspecified organism: Secondary | ICD-10-CM | POA: Diagnosis not present

## 2016-12-23 DIAGNOSIS — F909 Attention-deficit hyperactivity disorder, unspecified type: Secondary | ICD-10-CM | POA: Diagnosis not present

## 2016-12-23 DIAGNOSIS — R0902 Hypoxemia: Secondary | ICD-10-CM

## 2016-12-23 DIAGNOSIS — R000 Tachycardia, unspecified: Secondary | ICD-10-CM | POA: Diagnosis present

## 2016-12-23 LAB — CBC WITH DIFFERENTIAL/PLATELET
BASOS ABS: 0 10*3/uL (ref 0.0–0.1)
Basophils Relative: 0 %
EOS PCT: 0 %
Eosinophils Absolute: 0 10*3/uL (ref 0.0–0.7)
HCT: 37.6 % — ABNORMAL LOW (ref 39.0–52.0)
HEMOGLOBIN: 12.7 g/dL — AB (ref 13.0–17.0)
LYMPHS ABS: 1.5 10*3/uL (ref 0.7–4.0)
LYMPHS PCT: 6 %
MCH: 31.3 pg (ref 26.0–34.0)
MCHC: 33.8 g/dL (ref 30.0–36.0)
MCV: 92.6 fL (ref 78.0–100.0)
Monocytes Absolute: 2.1 10*3/uL — ABNORMAL HIGH (ref 0.1–1.0)
Monocytes Relative: 9 %
NEUTROS PCT: 85 %
Neutro Abs: 20.4 10*3/uL — ABNORMAL HIGH (ref 1.7–7.7)
PLATELETS: 171 10*3/uL (ref 150–400)
RBC: 4.06 MIL/uL — AB (ref 4.22–5.81)
RDW: 14.3 % (ref 11.5–15.5)
WBC: 24 10*3/uL — AB (ref 4.0–10.5)

## 2016-12-23 LAB — URINALYSIS, ROUTINE W REFLEX MICROSCOPIC
BILIRUBIN URINE: NEGATIVE
Bacteria, UA: NONE SEEN
GLUCOSE, UA: NEGATIVE mg/dL
Hgb urine dipstick: NEGATIVE
KETONES UR: NEGATIVE mg/dL
LEUKOCYTES UA: NEGATIVE
Nitrite: NEGATIVE
PH: 5 (ref 5.0–8.0)
Protein, ur: 30 mg/dL — AB
Specific Gravity, Urine: 1.03 (ref 1.005–1.030)

## 2016-12-23 LAB — LACTIC ACID, PLASMA
LACTIC ACID, VENOUS: 1.8 mmol/L (ref 0.5–1.9)
Lactic Acid, Venous: 1.1 mmol/L (ref 0.5–1.9)
Lactic Acid, Venous: 1.3 mmol/L (ref 0.5–1.9)

## 2016-12-23 LAB — COMPREHENSIVE METABOLIC PANEL
ALK PHOS: 58 U/L (ref 38–126)
ALT: 22 U/L (ref 17–63)
ALT: 21 U/L (ref 17–63)
ANION GAP: 11 (ref 5–15)
AST: 20 U/L (ref 15–41)
AST: 17 U/L (ref 15–41)
Albumin: 4.2 g/dL (ref 3.5–5.0)
Albumin: 3.5 g/dL (ref 3.5–5.0)
Alkaline Phosphatase: 70 U/L (ref 38–126)
Anion gap: 9 (ref 5–15)
BUN: 19 mg/dL (ref 6–20)
BUN: 17 mg/dL (ref 6–20)
CALCIUM: 8.7 mg/dL — AB (ref 8.9–10.3)
CHLORIDE: 105 mmol/L (ref 101–111)
CO2: 17 mmol/L — ABNORMAL LOW (ref 22–32)
CO2: 19 mmol/L — AB (ref 22–32)
CREATININE: 1.11 mg/dL (ref 0.61–1.24)
CREATININE: 0.84 mg/dL (ref 0.61–1.24)
Calcium: 9.3 mg/dL (ref 8.9–10.3)
Chloride: 104 mmol/L (ref 101–111)
GFR calc Af Amer: >60 mL/min (ref >60)
Glucose, Bld: 143 mg/dL — ABNORMAL HIGH (ref 65–99)
Glucose, Bld: 99 mg/dL (ref 65–99)
POTASSIUM: 3.4 mmol/L — AB (ref 3.5–5.1)
Potassium: 3.2 mmol/L — ABNORMAL LOW (ref 3.5–5.1)
SODIUM: 133 mmol/L — AB (ref 135–145)
Sodium: 132 mmol/L — ABNORMAL LOW (ref 135–145)
Total Bilirubin: 0.9 mg/dL (ref 0.3–1.2)
Total Bilirubin: 1 mg/dL (ref 0.3–1.2)
Total Protein: 7.6 g/dL (ref 6.5–8.1)
Total Protein: 6.7 g/dL (ref 6.5–8.1)

## 2016-12-23 LAB — MAGNESIUM: Magnesium: 1.6 mg/dL — ABNORMAL LOW (ref 1.7–2.4)

## 2016-12-23 LAB — MRSA PCR SCREENING: MRSA BY PCR: NEGATIVE

## 2016-12-23 LAB — PROTIME-INR
INR: 1.36
Prothrombin Time: 16.6 seconds — ABNORMAL HIGH (ref 11.4–15.2)

## 2016-12-23 LAB — CBC
HCT: 42.6 % (ref 39.0–52.0)
HEMOGLOBIN: 14.5 g/dL (ref 13.0–17.0)
MCH: 31.5 pg (ref 26.0–34.0)
MCHC: 34 g/dL (ref 30.0–36.0)
MCV: 92.6 fL (ref 78.0–100.0)
PLATELETS: 202 10*3/uL (ref 150–400)
RBC: 4.6 MIL/uL (ref 4.22–5.81)
RDW: 14.4 % (ref 11.5–15.5)
WBC: 28 10*3/uL — AB (ref 4.0–10.5)

## 2016-12-23 LAB — PROCALCITONIN: Procalcitonin: 9.94 ng/mL

## 2016-12-23 LAB — LIPASE, BLOOD: LIPASE: 20 U/L (ref 11–51)

## 2016-12-23 LAB — APTT: APTT: 34 s (ref 24–36)

## 2016-12-23 MED ORDER — ACETAMINOPHEN 650 MG RE SUPP: 650.0000 mg | Freq: Four times a day (QID) | RECTAL | Status: DC | PRN

## 2016-12-23 MED ORDER — ONDANSETRON HCL 4 MG/2ML IJ SOLN: 4.0000 mg | Freq: Four times a day (QID) | INTRAMUSCULAR | Status: DC | PRN

## 2016-12-23 MED ORDER — ARIPIPRAZOLE 10 MG PO TABS
5.0000 mg | ORAL_TABLET | Freq: Two times a day (BID) | ORAL | Status: DC
  Administered 2016-12-23 – 2016-12-27 (×8): 5 mg via ORAL
  Filled 2016-12-23 (×8): qty 1

## 2016-12-23 MED ORDER — AMANTADINE HCL 100 MG PO CAPS
100.0000 mg | ORAL_CAPSULE | Freq: Two times a day (BID) | ORAL | Status: DC
  Administered 2016-12-23 – 2016-12-27 (×8): 100 mg via ORAL
  Filled 2016-12-23 (×17): qty 1

## 2016-12-23 MED ORDER — PANTOPRAZOLE SODIUM 40 MG PO TBEC
40.0000 mg | DELAYED_RELEASE_TABLET | Freq: Every day | ORAL | Status: DC
  Administered 2016-12-23 – 2016-12-27 (×4): 40 mg via ORAL
  Filled 2016-12-23 (×4): qty 1

## 2016-12-23 MED ORDER — IOPAMIDOL (ISOVUE-300) INJECTION 61%
100.0000 mL | Freq: Once | INTRAVENOUS | Status: AC | PRN
  Administered 2016-12-23: 100 mL via INTRAVENOUS

## 2016-12-23 MED ORDER — CLONAZEPAM 0.5 MG PO TABS
0.5000 mg | ORAL_TABLET | Freq: Two times a day (BID) | ORAL | Status: DC
  Administered 2016-12-23 – 2016-12-27 (×8): 0.5 mg via ORAL
  Filled 2016-12-23 (×8): qty 1

## 2016-12-23 MED ORDER — PIPERACILLIN-TAZOBACTAM 3.375 G IVPB
3.3750 g | Freq: Three times a day (TID) | INTRAVENOUS | Status: DC
  Administered 2016-12-24 – 2016-12-27 (×10): 3.375 g via INTRAVENOUS
  Filled 2016-12-23 (×10): qty 50

## 2016-12-23 MED ORDER — SERTRALINE HCL 50 MG PO TABS
150.0000 mg | ORAL_TABLET | Freq: Every day | ORAL | Status: DC
  Administered 2016-12-23 – 2016-12-27 (×4): 150 mg via ORAL
  Filled 2016-12-23 (×4): qty 3

## 2016-12-23 MED ORDER — FENTANYL CITRATE (PF) 100 MCG/2ML IJ SOLN
50.0000 ug | INTRAMUSCULAR | Status: DC | PRN
  Administered 2016-12-24 – 2016-12-25 (×8): 50 ug via INTRAVENOUS
  Administered 2016-12-26: 25 ug via INTRAVENOUS
  Administered 2016-12-26: 50 ug via INTRAVENOUS
  Filled 2016-12-23 (×9): qty 2

## 2016-12-23 MED ORDER — ACETAMINOPHEN 325 MG PO TABS
650.0000 mg | ORAL_TABLET | Freq: Four times a day (QID) | ORAL | Status: DC | PRN
  Administered 2016-12-23 – 2016-12-24 (×2): 650 mg via ORAL
  Filled 2016-12-23 (×2): qty 2

## 2016-12-23 MED ORDER — PIPERACILLIN-TAZOBACTAM 3.375 G IVPB 30 MIN
3.3750 g | Freq: Once | INTRAVENOUS | Status: AC
  Administered 2016-12-23: 3.375 g via INTRAVENOUS
  Filled 2016-12-23: qty 50

## 2016-12-23 MED ORDER — FENTANYL CITRATE (PF) 100 MCG/2ML IJ SOLN
50.0000 ug | Freq: Once | INTRAMUSCULAR | Status: AC
  Administered 2016-12-23: 50 ug via INTRAVENOUS
  Filled 2016-12-23: qty 2

## 2016-12-23 MED ORDER — SODIUM CHLORIDE 0.9 % IV BOLUS (SEPSIS): 1000.0000 mL | Freq: Once | INTRAVENOUS | Status: DC

## 2016-12-23 MED ORDER — SODIUM CHLORIDE 0.9 % IV BOLUS (SEPSIS)
1000.0000 mL | Freq: Once | INTRAVENOUS | Status: AC
  Administered 2016-12-23: 1000 mL via INTRAVENOUS

## 2016-12-23 MED ORDER — POTASSIUM CHLORIDE IN NACL 20-0.9 MEQ/L-% IV SOLN
INTRAVENOUS | Status: DC
  Administered 2016-12-23 – 2016-12-25 (×5): via INTRAVENOUS

## 2016-12-23 MED ORDER — ACETAMINOPHEN 500 MG PO TABS
1000.0000 mg | ORAL_TABLET | Freq: Once | ORAL | Status: AC
  Administered 2016-12-23: 1000 mg via ORAL
  Filled 2016-12-23: qty 2

## 2016-12-23 MED ORDER — ENOXAPARIN SODIUM 40 MG/0.4ML ~~LOC~~ SOLN
40.0000 mg | SUBCUTANEOUS | Status: DC
  Administered 2016-12-24 – 2016-12-25 (×2): 40 mg via SUBCUTANEOUS
  Filled 2016-12-23 (×2): qty 0.4

## 2016-12-23 MED ORDER — PIPERACILLIN-TAZOBACTAM 3.375 G IVPB 30 MIN
3.3750 g | Freq: Once | INTRAVENOUS | Status: AC
  Administered 2016-12-23: 3.375 g via INTRAVENOUS
  Filled 2016-12-23 (×2): qty 50

## 2016-12-23 MED ORDER — ONDANSETRON HCL 4 MG PO TABS: 4.0000 mg | ORAL_TABLET | Freq: Four times a day (QID) | ORAL | Status: DC | PRN

## 2016-12-23 MED ORDER — AMANTADINE HCL 100 MG PO CAPS
ORAL_CAPSULE | ORAL | Status: AC
  Filled 2016-12-23: qty 2

## 2016-12-23 NOTE — ED Notes (Signed)
Pt no distress.  Denies any complaints at this time.  Pt receiving IV NS bolus.

## 2016-12-23 NOTE — ED Provider Notes (Addendum)
[facility] Provider Note   CSN: [id number] Arrival date & time: 12/23/16  0847     History   Chief Complaint Chief Complaint  Patient presents with  . Abdominal Pain    HPI Bobby Jimenez is a 36 y.o. male.  HPI Presents due to abdominal pain. Onset was approximately 36 hours ago. Since onset symptoms been worsening, with diffuse abdominal pain, distention, but no vomiting, no diarrhea. Patient acknowledges history of psychiatric disease, but denies history of abdominal disease or surgery. He states that he takes all medication as directed, has had no recent changes. Since onset no clear alleviating or exacerbating factors. He does note that over the past week he has been assisting his father who is super obese with getting out of chairs.  Past Medical History:  Diagnosis Date  . ADHD (attention deficit hyperactivity disorder)   . Anxiety   . Bipolar disorder (HCC)   . Cervical radiculopathy   . Depression   . Dizziness   . Headache   . Hypertension   . Lightheaded   . Low back pain   . Vertigo     Patient Active Problem List   Diagnosis Date Noted  . GERD (gastroesophageal reflux disease) 09/15/2014  . Nausea with vomiting 09/15/2014  . Diarrhea 09/15/2014  . Left sided abdominal pain 09/15/2014  . Alteration consciousness 12/23/2013  . Hypertension   . Anxiety   . Dizziness   . Lightheaded     Past Surgical History:  Procedure Laterality Date  . KNEE SURGERY Right   . WISDOM TOOTH EXTRACTION         Home Medications    Prior to Admission medications   Medication Sig Start Date End Date Taking? Authorizing Provider  ABILIFY 10 MG tablet Take 5 mg by mouth 2 (two) times daily.  08/11/14   [provider]  amantadine (SYMMETREL) 100 MG capsule Take 100 mg by mouth 2 (two) times daily.     [provider]  Cetirizine HCl (ZYRTEC ALLERGY) 10 MG CAPS Take 1 capsule (10 mg total) by mouth daily as needed  (for itching). 12/22/14   Devoria AlbeKnapp, Iva, MD  clonazePAM (KLONOPIN) 0.5 MG tablet Take 0.5 mg by mouth 2 (two) times daily.    [provider]  dicyclomine (BENTYL) 10 MG capsule TAKE 1 CAPSULE BY MOUTH 4 TIMES DAILY WITH MEALS AND AT BEDTIME AS NEEDED. HOLD FOR CONSTIPATION. 09/17/16   Tiffany KocherLewis, Leslie S, PA-C  lisinopril (PRINIVIL,ZESTRIL) 10 MG tablet Take 10 mg by mouth daily.    [provider]  pantoprazole (PROTONIX) 40 MG tablet TAKE 1 TABLET BY MOUTH ONCE DAILY BEFORE BREAKFAST. 12/14/16   Anice PaganiniGill, Eric A, NP  ZOLOFT 100 MG tablet Take 150 mg by mouth daily.  08/11/14   [provider]    Family History Family History  Adopted: Yes  Problem Relation Age of Onset  . High blood pressure Mother   . Seizures Mother     Social History Social History   Tobacco Use  . Smoking status: Current Every Day Smoker    Packs/day: 1.00    Types: Cigarettes  . Smokeless tobacco: Former Engineer, waterUser  Substance Use Topics  . Alcohol use: No    Alcohol/week: 1.2 oz    Types: 2 Cans of beer per week    Comment: heavy in past, short period of time in 20s.   . Drug use: No    Comment: Quit 1996     Allergies  Pineapple; Latex; and Morphine and related   Review of Systems Review of Systems  Constitutional:       Per HPI, otherwise negative  HENT:       Per HPI, otherwise negative  Respiratory:       Per HPI, otherwise negative  Cardiovascular:       Per HPI, otherwise negative  Gastrointestinal: Positive for abdominal distention and abdominal pain. Negative for vomiting.  Endocrine:       Negative aside from HPI  Genitourinary:       Neg aside from HPI   Musculoskeletal:       Per HPI, otherwise negative  Skin: Negative.   Neurological: Negative for syncope.     Physical Exam Updated Vital Signs BP (!) 86/64   Pulse (!) 139   Temp 99.5 F (37.5 C) (Oral)   Resp (!) 31   Ht 5\' 10"  (1.778 m)   Wt 111.1 kg (245 lb)   SpO2 97%   BMI 35.15 kg/m   Physical  Exam  Constitutional: He is oriented to person, place, and time. He appears well-developed. No distress.  HENT:  Head: Normocephalic and atraumatic.  Eyes: Conjunctivae and EOM are normal.  Cardiovascular: Normal rate and regular rhythm.  Pulmonary/Chest: Effort normal. No stridor. No respiratory distress.  Abdominal: He exhibits distension. There is generalized tenderness. There is guarding.  Musculoskeletal: He exhibits no edema.  Neurological: He is alert and oriented to person, place, and time.  Skin: Skin is warm and dry.  Psychiatric: He has a normal mood and affect.  Nursing note and vitals reviewed.    ED Treatments / Results  Labs (all labs ordered are listed, but only abnormal results are displayed) Labs Reviewed  COMPREHENSIVE METABOLIC PANEL - Abnormal; Notable for the following components:      Result Value   Sodium 132 (*)    Potassium 3.4 (*)    CO2 17 (*)    Glucose, Bld 143 (*)    All other components within normal limits  CBC - Abnormal; Notable for the following components:   WBC 28.0 (*)    All other components within normal limits  URINALYSIS, ROUTINE W REFLEX MICROSCOPIC - Abnormal; Notable for the following components:   Color, Urine AMBER (*)    APPearance HAZY (*)    Protein, ur 30 (*)    Squamous Epithelial / LPF 0-5 (*)    All other components within normal limits  LIPASE, BLOOD    EKG  EKG Interpretation  Date/Time:  Sunday December 23 2016 09:10:41 EST Ventricular Rate:  149 PR Interval:    QRS Duration: 87 QT Interval:  283 QTC Calculation: 446 R Axis:   76 Text Interpretation:  narrow complex tachycardia sinus tach or SVT Baseline wander T wave abnormality Abnormal ekg Confirmed by Gerhard MunchLockwood, Fahim Kats [phone]([id number]) on 12/23/2016 9:17:28 AM       Radiology Ct Abdomen Pelvis W Contrast  Result Date: 12/23/2016 CLINICAL DATA:  Abdominal pain radiating into the low back since yesterday. EXAM: CT ABDOMEN AND PELVIS WITH CONTRAST TECHNIQUE:  Multidetector CT imaging of the abdomen and pelvis was performed using the standard protocol following bolus administration of intravenous contrast. CONTRAST:  100 ml ISOVUE-300 IOPAMIDOL (ISOVUE-300) INJECTION 61% COMPARISON:  CT abdomen and pelvis 09/05/2014. FINDINGS: Lower chest: Heart size is normal. There is some dependent atelectasis. No pleural or pericardial effusion. Hepatobiliary: Multiple small stones are seen within the gallbladder. There is extensive stranding about the gallbladder and the gallbladder wall  is thickened. Mild fatty infiltration of the liver without focal lesion is seen. The biliary tree is unremarkable. Pancreas: Unremarkable. No pancreatic ductal dilatation or surrounding inflammatory changes. Spleen: Normal in size without focal abnormality. Adrenals/Urinary Tract: Adrenal glands are unremarkable. Kidneys are normal, without renal calculi, focal lesion, or hydronephrosis. Bladder is unremarkable. Stomach/Bowel: Stomach is within normal limits. Appendix appears normal. No evidence of small bowel obstruction. There is stranding about the proximal duodenum. The colon and appendix appear normal. Vascular/Lymphatic: No significant vascular findings are present. No enlarged abdominal or pelvic lymph nodes. Reproductive: Prostate is unremarkable. Other: Trace amount of free pelvic fluid is identified. Tiny fat containing umbilical hernia is seen. Musculoskeletal: Negative. IMPRESSION: Findings most consistent with acute cholecystitis with multiple small stones identified in the gallbladder. Stranding about the proximal duodenum is likely secondary to cholecystitis rather than duodenitis. Right upper quadrant ultrasound could be used for further evaluation. Mild fatty infiltration of the liver. Electronically Signed   By: Drusilla Kanner M.D.   On: 12/23/2016 11:08    Procedures Procedures (including critical care time)  Medications Ordered in ED Medications  piperacillin-tazobactam  (ZOSYN) IVPB 3.375 g (3.375 g Intravenous New Bag/Given 12/23/16 1129)  fentaNYL (SUBLIMAZE) injection 50 mcg (not administered)  sodium chloride 0.9 % bolus 1,000 mL (0 mLs Intravenous Stopped 12/23/16 1030)  iopamidol (ISOVUE-300) 61 % injection 100 mL (100 mLs Intravenous Contrast Given 12/23/16 1045)     Initial Impression / Assessment and Plan / ED Course  I have reviewed the triage vital signs and the nursing notes.  Pertinent labs & imaging results that were available during my care of the patient were reviewed by me and considered in my medical decision making (see chart for details).   He is hypotensive, tachycardic and tachypneic on initial evaluation. With abdominal pain, and these abnormal vital signs, CT scan ordered, lab ordered, fluid resuscitation started.    11:33 AM Patient and father aware of all findings. With concern for acute cholecystitis, tachycardia, hypotension discussed this case with our surgeon, and subsequently will discuss with our hospitalist team. Patient has received antibiotics, fluid resuscitation, analgesia.  12:35 PM  He remains tachypneic and tachycardic though his blood pressure has improved nearly completing 2 L fluid resuscitation. Blood pressure is now 95/70.  When he remains tachycardic, but notes that his baseline heart rate is in the 120 range. He states that his pain has improved.  Patient now febrile, receiving Tylenol.  Patient remains awake and alert. With requirement for admission, and continued fluid resuscitation, the patient will have central line placed with assistance of our vascular team.  Final Clinical Impressions(s) / ED Diagnoses  Acute cholecystitis sepsis   Gerhard Munch, MD 12/23/16 1135  CRITICAL CARE Performed by: Gerhard Munch Total critical care time: 35 minutes Critical care time was exclusive of separately billable procedures and treating other patients. Critical care was necessary to treat or prevent  imminent or life-threatening deterioration. Critical care was time spent personally by me on the following activities: development of treatment plan with patient and/or surrogate as well as nursing, discussions with consultants, evaluation of patient's response to treatment, examination of patient, obtaining history from patient or surrogate, ordering and performing treatments and interventions, ordering and review of laboratory studies, ordering and review of radiographic studies, pulse oximetry and re-evaluation of patient's condition.     Gerhard Munch, MD 12/23/16 [phone]

## 2016-12-23 NOTE — ED Notes (Signed)
Hypotensive, admitting MD notified. Orders for 1L NS fluid bolus given.

## 2016-12-23 NOTE — ED Triage Notes (Addendum)
Reports of abdominal pain that radiates into lower back. Denies n,v,d. Denies urinary symptoms.  Heart rate 162.

## 2016-12-23 NOTE — Consult Note (Signed)
Reason for Consult: Cholecystitis, cholelithiasis Referring Physician: Dr. Garnette Scheuermann Bobby Jimenez is an 36 y.o. male.  HPI: Patient is a 36 year old white male who presents with 48-hour history of worsening right upper quadrant abdominal pain and back pain.  This is his first episode.  He does have some nausea, but no emesis has been noted.  He denies any fever, chills, or jaundice.  CT scan of the abdomen was performed which revealed cholecystitis with cholelithiasis.  He was also noted to have a tachycardia and relative hypotension.  He currently has a pain of 4 out of 10.  Past Medical History:  Diagnosis Date  . ADHD (attention deficit hyperactivity disorder)   . Anxiety   . Bipolar disorder ([place])   . Cervical radiculopathy   . Depression   . Dizziness   . Headache   . Hypertension   . Lightheaded   . Low back pain   . Vertigo     Past Surgical History:  Procedure Laterality Date  . KNEE SURGERY Right   . WISDOM TOOTH EXTRACTION      Family History  Adopted: Yes  Problem Relation Age of Onset  . High blood pressure Mother   . Seizures Mother     Social History:  reports that he has been smoking cigarettes.  He has been smoking about 1.00 pack per day. He has quit using smokeless tobacco. He reports that he does not drink alcohol or use drugs.  Allergies:  Allergies  Allergen Reactions  . Pineapple Shortness Of Breath and Swelling  . Latex Swelling  . Morphine And Related Hives and Itching    Medications: I have reviewed the patient's current medications.  Results for orders placed or performed during the hospital encounter of 12/23/16 (from the past 48 hour(s))  Lipase, blood     Status: None   Collection Time: 12/23/16  9:14 AM  Result Value Ref Range   Lipase 20 11 - 51 U/L  Comprehensive metabolic panel     Status: Abnormal   Collection Time: 12/23/16  9:14 AM  Result Value Ref Range   Sodium 132 (L) 135 - 145 mmol/L   Potassium 3.4 (L) 3.5 - 5.1  mmol/L   Chloride 104 101 - 111 mmol/L   CO2 17 (L) 22 - 32 mmol/L   Glucose, Bld 143 (H) 65 - 99 mg/dL   BUN 19 6 - 20 mg/dL   Creatinine, Ser 1.11 0.61 - 1.24 mg/dL   Calcium 9.3 8.9 - 10.3 mg/dL   Total Protein 7.6 6.5 - 8.1 g/dL   Albumin 4.2 3.5 - 5.0 g/dL   AST 20 15 - 41 U/L   ALT 22 17 - 63 U/L   Alkaline Phosphatase 70 38 - 126 U/L   Total Bilirubin 0.9 0.3 - 1.2 mg/dL   GFR calc non Af Amer >60 >60 mL/min   GFR calc Af Amer >60 >60 mL/min    Comment: (NOTE) The eGFR has been calculated using the CKD EPI equation. This calculation has not been validated in all clinical situations. eGFR's persistently <60 mL/min signify possible Chronic Kidney Disease.    Anion gap 11 5 - 15  CBC     Status: Abnormal   Collection Time: 12/23/16  9:14 AM  Result Value Ref Range   WBC 28.0 (H) 4.0 - 10.5 K/uL    Comment: WHITE COUNT CONFIRMED ON SMEAR   RBC 4.60 4.22 - 5.81 MIL/uL   Hemoglobin 14.5 13.0 - 17.0 g/dL  HCT 42.6 39.0 - 52.0 %   MCV 92.6 78.0 - 100.0 fL   MCH 31.5 26.0 - 34.0 pg   MCHC 34.0 30.0 - 36.0 g/dL   RDW 14.4 11.5 - 15.5 %   Platelets 202 150 - 400 K/uL  Urinalysis, Routine w reflex microscopic     Status: Abnormal   Collection Time: 12/23/16  9:18 AM  Result Value Ref Range   Color, Urine AMBER (A) YELLOW    Comment: BIOCHEMICALS MAY BE AFFECTED BY COLOR   APPearance HAZY (A) CLEAR   Specific Gravity, Urine 1.030 1.005 - 1.030   pH 5.0 5.0 - 8.0   Glucose, UA NEGATIVE NEGATIVE mg/dL   Hgb urine dipstick NEGATIVE NEGATIVE   Bilirubin Urine NEGATIVE NEGATIVE   Ketones, ur NEGATIVE NEGATIVE mg/dL   Protein, ur 30 (A) NEGATIVE mg/dL   Nitrite NEGATIVE NEGATIVE   Leukocytes, UA NEGATIVE NEGATIVE   RBC / HPF 0-5 0 - 5 RBC/hpf   WBC, UA 0-5 0 - 5 WBC/hpf   Bacteria, UA NONE SEEN NONE SEEN   Squamous Epithelial / LPF 0-5 (A) NONE SEEN   Mucus PRESENT     Ct Abdomen Pelvis W Contrast  Result Date: 12/23/2016 CLINICAL DATA:  Abdominal pain radiating into  the low back since yesterday. EXAM: CT ABDOMEN AND PELVIS WITH CONTRAST TECHNIQUE: Multidetector CT imaging of the abdomen and pelvis was performed using the standard protocol following bolus administration of intravenous contrast. CONTRAST:  100 ml ISOVUE-300 IOPAMIDOL (ISOVUE-300) INJECTION 61% COMPARISON:  CT abdomen and pelvis 09/05/2014. FINDINGS: Lower chest: Heart size is normal. There is some dependent atelectasis. No pleural or pericardial effusion. Hepatobiliary: Multiple small stones are seen within the gallbladder. There is extensive stranding about the gallbladder and the gallbladder wall is thickened. Mild fatty infiltration of the liver without focal lesion is seen. The biliary tree is unremarkable. Pancreas: Unremarkable. No pancreatic ductal dilatation or surrounding inflammatory changes. Spleen: Normal in size without focal abnormality. Adrenals/Urinary Tract: Adrenal glands are unremarkable. Kidneys are normal, without renal calculi, focal lesion, or hydronephrosis. Bladder is unremarkable. Stomach/Bowel: Stomach is within normal limits. Appendix appears normal. No evidence of small bowel obstruction. There is stranding about the proximal duodenum. The colon and appendix appear normal. Vascular/Lymphatic: No significant vascular findings are present. No enlarged abdominal or pelvic lymph nodes. Reproductive: Prostate is unremarkable. Other: Trace amount of free pelvic fluid is identified. Tiny fat containing umbilical hernia is seen. Musculoskeletal: Negative. IMPRESSION: Findings most consistent with acute cholecystitis with multiple small stones identified in the gallbladder. Stranding about the proximal duodenum is likely secondary to cholecystitis rather than duodenitis. Right upper quadrant ultrasound could be used for further evaluation. Mild fatty infiltration of the liver. Electronically Signed   By: Inge Rise M.D.   On: 12/23/2016 11:08    ROS:  Pertinent items are noted in  HPI.  Blood pressure (!) 88/51, pulse (!) 134, temperature 99.5 F (37.5 C), temperature source Oral, resp. rate (!) 40, height _0  (1.778 m), weight 245 lb (111.1 kg), SpO2 93 %. Physical Exam: Pleasant well-developed well-nourished white male no acute distress Head is normocephalic, atraumatic Eyes without scleral icterus Lungs clear to auscultation with equal breath sounds bilaterally Heart examination reveals tachycardia with a regular rhythm.  No S3, S4, murmurs noted. Abdomen soft with mild tenderness noted to palpation in the right upper quadrant.  No hepatosplenomegaly, masses, or rigidity are noted.  CT scan images personally reviewed  Assessment/Plan: Impression: Cholecystitis, cholelithiasis.  He  also has an SVT. Plan: Will need hospitalist admission for control of his SVT.  No need for acute surgical intervention at this time.  Will need IV Zosyn.  Once his heart rate is under better control, will proceed with laparoscopic cholecystectomy.  Will follow with you.  Aviva Signs 12/23/2016, 12:02 PM

## 2016-12-23 NOTE — Plan of Care (Signed)
Care plans progressing 

## 2016-12-23 NOTE — H&P (Signed)
History and Physical  Bobby CraftWilliam S Cohick ZOX:[id number] DOB: [birth year] DOA: 12/23/2016   PCP: Kari BaarsHawkins, Edward, MD   Patient coming from: Home  Chief Complaint: abdominal pain   HPI:  Bobby Jimenez is a 36 y.o. male with medical history of depression, anxiety, ADHD, hypertension presenting with abdominal pain started at 4 AM on December 22, 2016.  The patient has been in his usual state of health prior to the onset of his abdominal pain.  His abdominal pain worsened over the next 24 hours.  As a result, the patient presents emergency department for further evaluation.  He states that if his abdominal pain is sharp in nature and constant mostly in the epigastric and right upper quadrant area.  He denies any fevers, chills, chest pain, shortness breath, coughing, hemoptysis, nausea, vomiting, diarrhea, dysuria, hematuria, headache, neck pain.  He denies any alcohol or illegal drug use.  He smokes half a pack per day for the past 20 years.  He denies any new medications or any over-the-counter medications.  In the emergency department, the patient had a low-grade temperature up to 99.5 F.  His blood pressure was soft with systolic blood pressure in the upper 80s and low 90s.  Patient was bolused with 2 L normal saline.  He was tachycardic into the 130s.  BMP showed a sodium 132, potassium 3.4, serum creatinine 1.1, WBC 28.0.  CT of the abdomen and pelvis showed cholelithiasis with extensive gallbladder stranding with gallbladder wall thickening.  General surgery, Dr. Lovell SheehanJenkins was consulted.  Patient was started on Zosyn.  Assessment/Plan: Sepsis -Secondary to acute cholecystitis  -urinalysis negative for pyuria -Chest x-ray -Obtain blood cultures x2 sets -Check lactic acid -Procalcitonin -Continue IV fluids  Acute cholecystitis -LFTs normal.  Lipase 20 -General surgery, Dr. Lovell SheehanJenkins consulted -Remain n.p.o. -case discussed with Dr. Lovell SheehanJenkins  Essential hypertension -Holding  lisinopril secondary to soft blood pressures  Tobacco abuse -Tobacco cessation discussed -NicoDerm patch deferred  Hyponatremia -Secondary to volume depletion -Continue IV fluids  Hypokalemia -Replete -Check magnesium  Depression/anxiety/bipolar disorder -Continue Abilify, Klonopin, Zoloft   Total critical care time 45 min      Past Medical History:  Diagnosis Date  . ADHD (attention deficit hyperactivity disorder)   . Anxiety   . Bipolar disorder (HCC)   . Cervical radiculopathy   . Depression   . Dizziness   . Headache   . Hypertension   . Lightheaded   . Low back pain   . Vertigo    Past Surgical History:  Procedure Laterality Date  . KNEE SURGERY Right   . WISDOM TOOTH EXTRACTION     Social History:  reports that he has been smoking cigarettes.  He has been smoking about 1.00 pack per day. He has quit using smokeless tobacco. He reports that he does not drink alcohol or use drugs.   Family History  Adopted: Yes  Problem Relation Age of Onset  . High blood pressure Mother   . Seizures Mother      Allergies  Allergen Reactions  . Pineapple Shortness Of Breath and Swelling  . Latex Swelling  . Morphine And Related Hives and Itching     Prior to Admission medications   Medication Sig Start Date End Date Taking? Authorizing Provider  ABILIFY 10 MG tablet Take 5 mg by mouth 2 (two) times daily.  08/11/14  Yes [provider]  amantadine (SYMMETREL) 100 MG capsule Take 100 mg by mouth 2 (two) times daily.  Yes [provider]  clonazePAM (KLONOPIN) 0.5 MG tablet Take 0.5 mg by mouth 2 (two) times daily.   Yes [provider]  dicyclomine (BENTYL) 10 MG capsule TAKE 1 CAPSULE BY MOUTH 4 TIMES DAILY WITH MEALS AND AT BEDTIME AS NEEDED. HOLD FOR CONSTIPATION. 09/17/16  Yes Tiffany Kocher, PA-C  lisinopril (PRINIVIL,ZESTRIL) 10 MG tablet Take 10 mg by mouth daily.   Yes [provider]  pantoprazole (PROTONIX) 40 MG  tablet TAKE 1 TABLET BY MOUTH ONCE DAILY BEFORE BREAKFAST. 12/14/16  Yes Wynne Dust A, NP  ZOLOFT 100 MG tablet Take 150 mg by mouth daily.  08/11/14  Yes [provider]  Cetirizine HCl (ZYRTEC ALLERGY) 10 MG CAPS Take 1 capsule (10 mg total) by mouth daily as needed (for itching). Patient not taking: Reported on 12/23/2016 12/22/14   Devoria Albe, MD    Review of Systems:  Constitutional:  No weight loss, night sweats, Fevers, chills, fatigue.  Head&Eyes: No headache.  No vision loss.  No eye pain or scotoma ENT:  No Difficulty swallowing,Tooth/dental problems,Sore throat,  No ear ache, post nasal drip,  Cardio-vascular:  No chest pain, Orthopnea, PND, swelling in lower extremities,  dizziness, palpitations  GI:  No   nausea, vomiting, diarrhea, loss of appetite, hematochezia, melena, heartburn, indigestion, Resp:  No shortness of breath with exertion or at rest. No cough. No coughing up of blood .No wheezing.No chest wall deformity  Skin:  no rash or lesions.  GU:  no dysuria, change in color of urine, no urgency or frequency. No flank pain.  Musculoskeletal:  No joint pain or swelling. No decreased range of motion. No back pain.  Psych:  No change in mood or affect. No depression or anxiety. Neurologic: No headache, no dysesthesia, no focal weakness, no vision loss. No syncope  Physical Exam: Vitals:   12/23/16 0919 12/23/16 0930 12/23/16 1000 12/23/16 1030  BP: (!) 92/47 (!) 86/64 104/64 98/67  Pulse: (!) 144 (!) 139 (!) 128 (!) 137  Resp: (!) 28 (!) 31 (!) 32 (!) 31  Temp:      TempSrc:      SpO2: 95% 97% 99% 97%  Weight:      Height:       General:  A&O x 3, NAD, nontoxic, pleasant/cooperative Head/Eye: No conjunctival hemorrhage, no icterus, [place]/AT, No nystagmus ENT:  No icterus,  No thrush, good dentition, no pharyngeal exudate Neck:  No masses, no lymphadenpathy, no bruits CV:  RRR, no rub, no gallop, no S3 Lung:  CTAB, good air movement, no wheeze, no  rhonchi Abdomen: soft/RUQ and epigastric pain; NT, +BS, nondistended, no peritoneal signs Ext: No cyanosis, No rashes, No petechiae, No lymphangitis, No edema Neuro: CNII-XII intact, strength 4/5 in bilateral upper and lower extremities, no dysmetria  Labs on Admission:  Basic Metabolic Panel: Recent Labs  Lab 12/23/16 0914  NA 132*  K 3.4*  CL 104  CO2 17*  GLUCOSE 143*  BUN 19  CREATININE 1.11  CALCIUM 9.3   Liver Function Tests: Recent Labs  Lab 12/23/16 0914  AST 20  ALT 22  ALKPHOS 70  BILITOT 0.9  PROT 7.6  ALBUMIN 4.2   Recent Labs  Lab 12/23/16 0914  LIPASE 20   No results for input(s): AMMONIA in the last 168 hours. CBC: Recent Labs  Lab 12/23/16 0914  WBC 28.0*  HGB 14.5  HCT 42.6  MCV 92.6  PLT 202   Coagulation Profile: No results for input(s): INR,  PROTIME in the last 168 hours. Cardiac Enzymes: No results for input(s): CKTOTAL, CKMB, CKMBINDEX, TROPONINI in the last 168 hours. BNP: Invalid input(s): POCBNP CBG: No results for input(s): GLUCAP in the last 168 hours. Urine analysis:    Component Value Date/Time   COLORURINE AMBER (A) 12/23/2016 0918   APPEARANCEUR HAZY (A) 12/23/2016 0918   LABSPEC 1.030 12/23/2016 0918   PHURINE 5.0 12/23/2016 0918   GLUCOSEU NEGATIVE 12/23/2016 0918   HGBUR NEGATIVE 12/23/2016 0918   BILIRUBINUR NEGATIVE 12/23/2016 0918   KETONESUR NEGATIVE 12/23/2016 0918   PROTEINUR 30 (A) 12/23/2016 0918   UROBILINOGEN 0.2 09/05/2014 1954   NITRITE NEGATIVE 12/23/2016 0918   LEUKOCYTESUR NEGATIVE 12/23/2016 0918   Sepsis Labs: @LABRCNTIP (procalcitonin:4,lacticidven:4) )No results found for this or any previous visit (from the past 240 hour(s)).   Radiological Exams on Admission: Ct Abdomen Pelvis W Contrast  Result Date: 12/23/2016 CLINICAL DATA:  Abdominal pain radiating into the low back since yesterday. EXAM: CT ABDOMEN AND PELVIS WITH CONTRAST TECHNIQUE: Multidetector CT imaging of the abdomen and  pelvis was performed using the standard protocol following bolus administration of intravenous contrast. CONTRAST:  100 ml ISOVUE-300 IOPAMIDOL (ISOVUE-300) INJECTION 61% COMPARISON:  CT abdomen and pelvis 09/05/2014. FINDINGS: Lower chest: Heart size is normal. There is some dependent atelectasis. No pleural or pericardial effusion. Hepatobiliary: Multiple small stones are seen within the gallbladder. There is extensive stranding about the gallbladder and the gallbladder wall is thickened. Mild fatty infiltration of the liver without focal lesion is seen. The biliary tree is unremarkable. Pancreas: Unremarkable. No pancreatic ductal dilatation or surrounding inflammatory changes. Spleen: Normal in size without focal abnormality. Adrenals/Urinary Tract: Adrenal glands are unremarkable. Kidneys are normal, without renal calculi, focal lesion, or hydronephrosis. Bladder is unremarkable. Stomach/Bowel: Stomach is within normal limits. Appendix appears normal. No evidence of small bowel obstruction. There is stranding about the proximal duodenum. The colon and appendix appear normal. Vascular/Lymphatic: No significant vascular findings are present. No enlarged abdominal or pelvic lymph nodes. Reproductive: Prostate is unremarkable. Other: Trace amount of free pelvic fluid is identified. Tiny fat containing umbilical hernia is seen. Musculoskeletal: Negative. IMPRESSION: Findings most consistent with acute cholecystitis with multiple small stones identified in the gallbladder. Stranding about the proximal duodenum is likely secondary to cholecystitis rather than duodenitis. Right upper quadrant ultrasound could be used for further evaluation. Mild fatty infiltration of the liver. Electronically Signed   By: Drusilla Kannerhomas  Dalessio M.D.   On: 12/23/2016 11:08        Time spent:60 minutes Code Status:   FULL Family Communication:  Father updated at bedside Disposition Plan: expect 2-3 day hospitalization Consults  called: general surgery DVT Prophylaxis: Tynan Loveno  Haasini Patnaude, DO  [facility] Pager [phone]  If 7PM-7AM, please contact night-coverage [email or web address] Password [email or web address] 12/23/2016, 11:55 AM

## 2016-12-24 LAB — COMPREHENSIVE METABOLIC PANEL
ALT: 19 U/L (ref 17–63)
AST: 16 U/L (ref 15–41)
Albumin: 3.3 g/dL — ABNORMAL LOW (ref 3.5–5.0)
Alkaline Phosphatase: 58 U/L (ref 38–126)
Anion gap: 8 (ref 5–15)
BILIRUBIN TOTAL: 0.9 mg/dL (ref 0.3–1.2)
BUN: 17 mg/dL (ref 6–20)
CHLORIDE: 106 mmol/L (ref 101–111)
CO2: 21 mmol/L — ABNORMAL LOW (ref 22–32)
CREATININE: 0.87 mg/dL (ref 0.61–1.24)
Calcium: 8.6 mg/dL — ABNORMAL LOW (ref 8.9–10.3)
GFR calc Af Amer: >60 mL/min (ref >60)
Glucose, Bld: 104 mg/dL — ABNORMAL HIGH (ref 65–99)
Potassium: 3.2 mmol/L — ABNORMAL LOW (ref 3.5–5.1)
Sodium: 135 mmol/L (ref 135–145)
TOTAL PROTEIN: 6.7 g/dL (ref 6.5–8.1)

## 2016-12-24 LAB — CBC
HEMATOCRIT: 37.3 % — AB (ref 39.0–52.0)
Hemoglobin: 12.4 g/dL — ABNORMAL LOW (ref 13.0–17.0)
MCH: 30.9 pg (ref 26.0–34.0)
MCHC: 33.2 g/dL (ref 30.0–36.0)
MCV: 93 fL (ref 78.0–100.0)
PLATELETS: 175 10*3/uL (ref 150–400)
RBC: 4.01 MIL/uL — AB (ref 4.22–5.81)
RDW: 14.5 % (ref 11.5–15.5)
WBC: 21.1 10*3/uL — ABNORMAL HIGH (ref 4.0–10.5)

## 2016-12-24 LAB — HIV ANTIBODY (ROUTINE TESTING W REFLEX): HIV Screen 4th Generation wRfx: NONREACTIVE

## 2016-12-24 MED ORDER — POTASSIUM CHLORIDE 10 MEQ/100ML IV SOLN
10.0000 meq | INTRAVENOUS | Status: AC
  Administered 2016-12-24 (×4): 10 meq via INTRAVENOUS
  Filled 2016-12-24 (×4): qty 100

## 2016-12-24 MED ORDER — SODIUM CHLORIDE 0.9 % IV BOLUS (SEPSIS)
1000.0000 mL | Freq: Once | INTRAVENOUS | Status: AC
  Administered 2016-12-24: 1000 mL via INTRAVENOUS

## 2016-12-24 NOTE — Progress Notes (Signed)
Subjective: Patient states his abdominal pain is well controlled.  Has not worsened overnight.  No nausea or vomiting noted.  Objective: Vital signs in last 24 hours: Temp:  [99.1 F (37.3 C)-102.6 F (39.2 C)] 100 F (37.8 C) (11/26 0600) Pulse Rate:  [112-163] 121 (11/26 0600) Resp:  [18-40] 27 (11/26 0600) BP: (86-123)/(47-80) 115/72 (11/26 0600) SpO2:  [90 %-100 %] 95 % (11/26 0600) Weight:  [242 lb 4.6 oz (109.9 kg)-245 lb (111.1 kg)] 242 lb 4.6 oz (109.9 kg) (11/25 1503) Last BM Date: 12/24/16  Intake/Output from previous day: 11/25 0701 - 11/26 0700 In: 1977.1 [I.V.:1877.1; IV Piggyback:100] Out: 475 [Urine:475] Intake/Output this shift: No intake/output data recorded.  General appearance: alert, cooperative and no distress GI: Soft with mild tenderness to palpation in the right upper quadrant.  No rigidity noted.  Lab Results:  Recent Labs    12/23/16 1457 12/24/16 0445  WBC 24.0* 21.1*  HGB 12.7* 12.4*  HCT 37.6* 37.3*  PLT 171 175   BMET Recent Labs    12/23/16 1457 12/24/16 0445  NA 133* 135  K 3.2* 3.2*  CL 105 106  CO2 19* 21*  GLUCOSE 99 104*  BUN 17 17  CREATININE 0.84 0.87  CALCIUM 8.7* 8.6*   PT/INR Recent Labs    12/23/16 1457  LABPROT 16.6*  INR 1.36    Studies/Results: Ct Abdomen Pelvis W Contrast  Result Date: 12/23/2016 CLINICAL DATA:  Abdominal pain radiating into the low back since yesterday. EXAM: CT ABDOMEN AND PELVIS WITH CONTRAST TECHNIQUE: Multidetector CT imaging of the abdomen and pelvis was performed using the standard protocol following bolus administration of intravenous contrast. CONTRAST:  100 ml ISOVUE-300 IOPAMIDOL (ISOVUE-300) INJECTION 61% COMPARISON:  CT abdomen and pelvis 09/05/2014. FINDINGS: Lower chest: Heart size is normal. There is some dependent atelectasis. No pleural or pericardial effusion. Hepatobiliary: Multiple small stones are seen within the gallbladder. There is extensive stranding about the  gallbladder and the gallbladder wall is thickened. Mild fatty infiltration of the liver without focal lesion is seen. The biliary tree is unremarkable. Pancreas: Unremarkable. No pancreatic ductal dilatation or surrounding inflammatory changes. Spleen: Normal in size without focal abnormality. Adrenals/Urinary Tract: Adrenal glands are unremarkable. Kidneys are normal, without renal calculi, focal lesion, or hydronephrosis. Bladder is unremarkable. Stomach/Bowel: Stomach is within normal limits. Appendix appears normal. No evidence of small bowel obstruction. There is stranding about the proximal duodenum. The colon and appendix appear normal. Vascular/Lymphatic: No significant vascular findings are present. No enlarged abdominal or pelvic lymph nodes. Reproductive: Prostate is unremarkable. Other: Trace amount of free pelvic fluid is identified. Tiny fat containing umbilical hernia is seen. Musculoskeletal: Negative. IMPRESSION: Findings most consistent with acute cholecystitis with multiple small stones identified in the gallbladder. Stranding about the proximal duodenum is likely secondary to cholecystitis rather than duodenitis. Right upper quadrant ultrasound could be used for further evaluation. Mild fatty infiltration of the liver. Electronically Signed   By: Drusilla Kannerhomas  Dalessio M.D.   On: 12/23/2016 11:08   Dg Chest Port 1 View  Result Date: 12/23/2016 CLINICAL DATA:  36 year old male with sepsis. EXAM: PORTABLE CHEST 1 VIEW COMPARISON:  12/22/2013 FINDINGS: There are low lung volumes with bronchovascular crowding. Cardiomediastinal silhouette appears slightly enlarged. Right basilar linear opacities most suggestive of atelectasis but early developing infiltrate not excluded. No sizable pleural effusions. No pneumothorax. No acute osseous abnormalities. IMPRESSION: Low lung volumes with bronchovascular crowding. Linear right basilar opacities most suggestive of atelectasis, early developing infiltrate not  excluded. Electronically  Signed   By: Sande BrothersSerena  Chacko M.D.   On: 12/23/2016 15:29    Anti-infectives: Anti-infectives (From admission, onward)   Start     Dose/Rate Route Frequency Ordered Stop   12/24/16 0000  piperacillin-tazobactam (ZOSYN) IVPB 3.375 g     3.375 g 12.5 mL/hr over 240 Minutes Intravenous Every 8 hours 12/23/16 1633     12/23/16 1500  piperacillin-tazobactam (ZOSYN) IVPB 3.375 g     3.375 g 100 mL/hr over 30 Minutes Intravenous  Once 12/23/16 1454 12/23/16 1730   12/23/16 1115  piperacillin-tazobactam (ZOSYN) IVPB 3.375 g     3.375 g 100 mL/hr over 30 Minutes Intravenous  Once 12/23/16 1112 12/23/16 1155      Assessment/Plan: Impression: Acute cholecystitis, cholelithiasis.  Persistent tachycardia.  Leukocytosis improving.  Liver enzyme tests unremarkable. Plan: Discussed with Dr. Juanetta GoslingHawkins.  Would like heart rate under better control prior to surgical intervention.  May have clear liquid diet today.  Anticipate laparoscopic cholecystectomy on 12/26/2016.  Continue IV Zosyn.  LOS: 1 day    Franky MachoMark Naje Rice 12/24/2016

## 2016-12-24 NOTE — Progress Notes (Signed)
Subjective: He was admitted with cholecystitis/sepsis.  He is still tachycardic.  He has hypertension at baseline and it seems to me that he has been on metoprolol as an outpatient but I will check my outpatient chart.  He has no other new complaints.  He says his breathing is okay but he does have significant smoking history.  No chest pain.  Objective: Vital signs in last 24 hours: Temp:  [99.1 F (37.3 C)-102.6 F (39.2 C)] 100 F (37.8 C) (11/26 0600) Pulse Rate:  [112-163] 121 (11/26 0600) Resp:  [18-40] 27 (11/26 0600) BP: (86-123)/(47-80) 115/72 (11/26 0600) SpO2:  [90 %-100 %] 95 % (11/26 0600) Weight:  [109.9 kg (242 lb 4.6 oz)-111.1 kg (245 lb)] 109.9 kg (242 lb 4.6 oz) (11/25 1503) Weight change:  Last BM Date: 12/24/16  Intake/Output from previous day: 11/25 0701 - 11/26 0700 In: 1977.1 [I.V.:1877.1; IV Piggyback:100] Out: 475 [Urine:475]  PHYSICAL EXAM General appearance: alert, cooperative and no distress Resp: clear to auscultation bilaterally Cardio: regular rate and rhythm, S1, S2 normal, no murmur, click, rub or gallop GI: mild ruq tenderness Extremities: extremities normal, atraumatic, no cyanosis or edema he is obese  Lab Results:  Results for orders placed or performed during the hospital encounter of 12/23/16 (from the past 48 hour(s))  Lipase, blood     Status: None   Collection Time: 12/23/16  9:14 AM  Result Value Ref Range   Lipase 20 11 - 51 U/L  Comprehensive metabolic panel     Status: Abnormal   Collection Time: 12/23/16  9:14 AM  Result Value Ref Range   Sodium 132 (L) 135 - 145 mmol/L   Potassium 3.4 (L) 3.5 - 5.1 mmol/L   Chloride 104 101 - 111 mmol/L   CO2 17 (L) 22 - 32 mmol/L   Glucose, Bld 143 (H) 65 - 99 mg/dL   BUN 19 6 - 20 mg/dL   Creatinine, Ser 1.11 0.61 - 1.24 mg/dL   Calcium 9.3 8.9 - 10.3 mg/dL   Total Protein 7.6 6.5 - 8.1 g/dL   Albumin 4.2 3.5 - 5.0 g/dL   AST 20 15 - 41 U/L   ALT 22 17 - 63 U/L   Alkaline  Phosphatase 70 38 - 126 U/L   Total Bilirubin 0.9 0.3 - 1.2 mg/dL   GFR calc non Af Amer >60 >60 mL/min   GFR calc Af Amer >60 >60 mL/min    Comment: (NOTE) The eGFR has been calculated using the CKD EPI equation. This calculation has not been validated in all clinical situations. eGFR's persistently <60 mL/min signify possible Chronic Kidney Disease.    Anion gap 11 5 - 15  CBC     Status: Abnormal   Collection Time: 12/23/16  9:14 AM  Result Value Ref Range   WBC 28.0 (H) 4.0 - 10.5 K/uL    Comment: WHITE COUNT CONFIRMED ON SMEAR   RBC 4.60 4.22 - 5.81 MIL/uL   Hemoglobin 14.5 13.0 - 17.0 g/dL   HCT 42.6 39.0 - 52.0 %   MCV 92.6 78.0 - 100.0 fL   MCH 31.5 26.0 - 34.0 pg   MCHC 34.0 30.0 - 36.0 g/dL   RDW 14.4 11.5 - 15.5 %   Platelets 202 150 - 400 K/uL  Urinalysis, Routine w reflex microscopic     Status: Abnormal   Collection Time: 12/23/16  9:18 AM  Result Value Ref Range   Color, Urine AMBER (A) YELLOW    Comment: BIOCHEMICALS MAY  BE AFFECTED BY COLOR   APPearance HAZY (A) CLEAR   Specific Gravity, Urine 1.030 1.005 - 1.030   pH 5.0 5.0 - 8.0   Glucose, UA NEGATIVE NEGATIVE mg/dL   Hgb urine dipstick NEGATIVE NEGATIVE   Bilirubin Urine NEGATIVE NEGATIVE   Ketones, ur NEGATIVE NEGATIVE mg/dL   Protein, ur 30 (A) NEGATIVE mg/dL   Nitrite NEGATIVE NEGATIVE   Leukocytes, UA NEGATIVE NEGATIVE   RBC / HPF 0-5 0 - 5 RBC/hpf   WBC, UA 0-5 0 - 5 WBC/hpf   Bacteria, UA NONE SEEN NONE SEEN   Squamous Epithelial / LPF 0-5 (A) NONE SEEN   Mucus PRESENT   Culture, blood (Routine X 2) w Reflex to ID Panel     Status: None (Preliminary result)   Collection Time: 12/23/16 11:46 AM  Result Value Ref Range   Specimen Description BLOOD LEFT ARM    Special Requests      BOTTLES DRAWN AEROBIC AND ANAEROBIC Blood Culture adequate volume   Culture NO GROWTH < 24 HOURS    Report Status PENDING   Culture, blood (Routine X 2) w Reflex to ID Panel     Status: None (Preliminary result)    Collection Time: 12/23/16 11:52 AM  Result Value Ref Range   Specimen Description BLOOD LEFT ARM    Special Requests      BOTTLES DRAWN AEROBIC AND ANAEROBIC Blood Culture adequate volume   Culture NO GROWTH < 24 HOURS    Report Status PENDING   Lactic acid, plasma     Status: None   Collection Time: 12/23/16 11:55 AM  Result Value Ref Range   Lactic Acid, Venous 1.8 0.5 - 1.9 mmol/L  MRSA PCR Screening     Status: None   Collection Time: 12/23/16  2:50 PM  Result Value Ref Range   MRSA by PCR NEGATIVE NEGATIVE    Comment:        The GeneXpert MRSA Assay (FDA approved for NASAL specimens only), is one component of a comprehensive MRSA colonization surveillance program. It is not intended to diagnose MRSA infection nor to guide or monitor treatment for MRSA infections.   Lactic acid, plasma     Status: None   Collection Time: 12/23/16  2:55 PM  Result Value Ref Range   Lactic Acid, Venous 1.1 0.5 - 1.9 mmol/L  CBC with Differential     Status: Abnormal   Collection Time: 12/23/16  2:57 PM  Result Value Ref Range   WBC 24.0 (H) 4.0 - 10.5 K/uL   RBC 4.06 (L) 4.22 - 5.81 MIL/uL   Hemoglobin 12.7 (L) 13.0 - 17.0 g/dL   HCT 37.6 (L) 39.0 - 52.0 %   MCV 92.6 78.0 - 100.0 fL   MCH 31.3 26.0 - 34.0 pg   MCHC 33.8 30.0 - 36.0 g/dL   RDW 14.3 11.5 - 15.5 %   Platelets 171 150 - 400 K/uL   Neutrophils Relative % 85 %   Neutro Abs 20.4 (H) 1.7 - 7.7 K/uL   Lymphocytes Relative 6 %   Lymphs Abs 1.5 0.7 - 4.0 K/uL   Monocytes Relative 9 %   Monocytes Absolute 2.1 (H) 0.1 - 1.0 K/uL   Eosinophils Relative 0 %   Eosinophils Absolute 0.0 0.0 - 0.7 K/uL   Basophils Relative 0 %   Basophils Absolute 0.0 0.0 - 0.1 K/uL  Comprehensive metabolic panel     Status: Abnormal   Collection Time: 12/23/16  2:57 PM  Result  Value Ref Range   Sodium 133 (L) 135 - 145 mmol/L   Potassium 3.2 (L) 3.5 - 5.1 mmol/L   Chloride 105 101 - 111 mmol/L   CO2 19 (L) 22 - 32 mmol/L   Glucose, Bld  99 65 - 99 mg/dL   BUN 17 6 - 20 mg/dL   Creatinine, Ser 0.84 0.61 - 1.24 mg/dL   Calcium 8.7 (L) 8.9 - 10.3 mg/dL   Total Protein 6.7 6.5 - 8.1 g/dL   Albumin 3.5 3.5 - 5.0 g/dL   AST 17 15 - 41 U/L   ALT 21 17 - 63 U/L   Alkaline Phosphatase 58 38 - 126 U/L   Total Bilirubin 1.0 0.3 - 1.2 mg/dL   GFR calc non Af Amer >60 >60 mL/min   GFR calc Af Amer >60 >60 mL/min    Comment: (NOTE) The eGFR has been calculated using the CKD EPI equation. This calculation has not been validated in all clinical situations. eGFR's persistently <60 mL/min signify possible Chronic Kidney Disease.    Anion gap 9 5 - 15  Procalcitonin     Status: None   Collection Time: 12/23/16  2:57 PM  Result Value Ref Range   Procalcitonin 9.94 ng/mL    Comment:        Interpretation: PCT > 2 ng/mL: Systemic infection (sepsis) is likely, unless other causes are known. (NOTE)         ICU PCT Algorithm               Non ICU PCT Algorithm    ----------------------------     ------------------------------         PCT < 0.25 ng/mL                 PCT < 0.1 ng/mL     Stopping of antibiotics            Stopping of antibiotics       strongly encouraged.               strongly encouraged.    ----------------------------     ------------------------------       PCT level decrease by               PCT < 0.25 ng/mL       >= 80% from peak PCT       OR PCT 0.25 - 0.5 ng/mL          Stopping of antibiotics                                             encouraged.     Stopping of antibiotics           encouraged.    ----------------------------     ------------------------------       PCT level decrease by              PCT >= 0.25 ng/mL       < 80% from peak PCT        AND PCT >= 0.5 ng/mL            Continuing antibiotics  encouraged.       Continuing antibiotics            encouraged.    ----------------------------     ------------------------------     PCT level  increase compared          PCT > 0.5 ng/mL         with peak PCT AND          PCT >= 0.5 ng/mL             Escalation of antibiotics                                          strongly encouraged.      Escalation of antibiotics        strongly encouraged.   Protime-INR     Status: Abnormal   Collection Time: 12/23/16  2:57 PM  Result Value Ref Range   Prothrombin Time 16.6 (H) 11.4 - 15.2 seconds   INR 1.36   APTT     Status: None   Collection Time: 12/23/16  2:57 PM  Result Value Ref Range   aPTT 34 24 - 36 seconds  Magnesium     Status: Abnormal   Collection Time: 12/23/16  2:57 PM  Result Value Ref Range   Magnesium 1.6 (L) 1.7 - 2.4 mg/dL  Lactic acid, plasma     Status: None   Collection Time: 12/23/16  5:55 PM  Result Value Ref Range   Lactic Acid, Venous 1.3 0.5 - 1.9 mmol/L  CBC     Status: Abnormal   Collection Time: 12/24/16  4:45 AM  Result Value Ref Range   WBC 21.1 (H) 4.0 - 10.5 K/uL   RBC 4.01 (L) 4.22 - 5.81 MIL/uL   Hemoglobin 12.4 (L) 13.0 - 17.0 g/dL   HCT 37.3 (L) 39.0 - 52.0 %   MCV 93.0 78.0 - 100.0 fL   MCH 30.9 26.0 - 34.0 pg   MCHC 33.2 30.0 - 36.0 g/dL   RDW 14.5 11.5 - 15.5 %   Platelets 175 150 - 400 K/uL  Comprehensive metabolic panel     Status: Abnormal   Collection Time: 12/24/16  4:45 AM  Result Value Ref Range   Sodium 135 135 - 145 mmol/L   Potassium 3.2 (L) 3.5 - 5.1 mmol/L   Chloride 106 101 - 111 mmol/L   CO2 21 (L) 22 - 32 mmol/L   Glucose, Bld 104 (H) 65 - 99 mg/dL   BUN 17 6 - 20 mg/dL   Creatinine, Ser 0.87 0.61 - 1.24 mg/dL   Calcium 8.6 (L) 8.9 - 10.3 mg/dL   Total Protein 6.7 6.5 - 8.1 g/dL   Albumin 3.3 (L) 3.5 - 5.0 g/dL   AST 16 15 - 41 U/L   ALT 19 17 - 63 U/L   Alkaline Phosphatase 58 38 - 126 U/L   Total Bilirubin 0.9 0.3 - 1.2 mg/dL   GFR calc non Af Amer >60 >60 mL/min   GFR calc Af Amer >60 >60 mL/min    Comment: (NOTE) The eGFR has been calculated using the CKD EPI equation. This calculation has not been  validated in all clinical situations. eGFR's persistently <60 mL/min signify possible Chronic Kidney Disease.    Anion gap 8 5 - 15    ABGS No results for input(s): PHART, PO2ART, TCO2, HCO3 in the  last 72 hours.  Invalid input(s): PCO2 CULTURES Recent Results (from the past 240 hour(s))  Culture, blood (Routine X 2) w Reflex to ID Panel     Status: None (Preliminary result)   Collection Time: 12/23/16 11:46 AM  Result Value Ref Range Status   Specimen Description BLOOD LEFT ARM  Final   Special Requests   Final    BOTTLES DRAWN AEROBIC AND ANAEROBIC Blood Culture adequate volume   Culture NO GROWTH < 24 HOURS  Final   Report Status PENDING  Incomplete  Culture, blood (Routine X 2) w Reflex to ID Panel     Status: None (Preliminary result)   Collection Time: 12/23/16 11:52 AM  Result Value Ref Range Status   Specimen Description BLOOD LEFT ARM  Final   Special Requests   Final    BOTTLES DRAWN AEROBIC AND ANAEROBIC Blood Culture adequate volume   Culture NO GROWTH < 24 HOURS  Final   Report Status PENDING  Incomplete  MRSA PCR Screening     Status: None   Collection Time: 12/23/16  2:50 PM  Result Value Ref Range Status   MRSA by PCR NEGATIVE NEGATIVE Final    Comment:        The GeneXpert MRSA Assay (FDA approved for NASAL specimens only), is one component of a comprehensive MRSA colonization surveillance program. It is not intended to diagnose MRSA infection nor to guide or monitor treatment for MRSA infections.    Studies/Results: Ct Abdomen Pelvis W Contrast  Result Date: 12/23/2016 CLINICAL DATA:  Abdominal pain radiating into the low back since yesterday. EXAM: CT ABDOMEN AND PELVIS WITH CONTRAST TECHNIQUE: Multidetector CT imaging of the abdomen and pelvis was performed using the standard protocol following bolus administration of intravenous contrast. CONTRAST:  100 ml ISOVUE-300 IOPAMIDOL (ISOVUE-300) INJECTION 61% COMPARISON:  CT abdomen and pelvis  09/05/2014. FINDINGS: Lower chest: Heart size is normal. There is some dependent atelectasis. No pleural or pericardial effusion. Hepatobiliary: Multiple small stones are seen within the gallbladder. There is extensive stranding about the gallbladder and the gallbladder wall is thickened. Mild fatty infiltration of the liver without focal lesion is seen. The biliary tree is unremarkable. Pancreas: Unremarkable. No pancreatic ductal dilatation or surrounding inflammatory changes. Spleen: Normal in size without focal abnormality. Adrenals/Urinary Tract: Adrenal glands are unremarkable. Kidneys are normal, without renal calculi, focal lesion, or hydronephrosis. Bladder is unremarkable. Stomach/Bowel: Stomach is within normal limits. Appendix appears normal. No evidence of small bowel obstruction. There is stranding about the proximal duodenum. The colon and appendix appear normal. Vascular/Lymphatic: No significant vascular findings are present. No enlarged abdominal or pelvic lymph nodes. Reproductive: Prostate is unremarkable. Other: Trace amount of free pelvic fluid is identified. Tiny fat containing umbilical hernia is seen. Musculoskeletal: Negative. IMPRESSION: Findings most consistent with acute cholecystitis with multiple small stones identified in the gallbladder. Stranding about the proximal duodenum is likely secondary to cholecystitis rather than duodenitis. Right upper quadrant ultrasound could be used for further evaluation. Mild fatty infiltration of the liver. Electronically Signed   By: Inge Rise M.D.   On: 12/23/2016 11:08   Dg Chest Port 1 View  Result Date: 12/23/2016 CLINICAL DATA:  36 year old male with sepsis. EXAM: PORTABLE CHEST 1 VIEW COMPARISON:  12/22/2013 FINDINGS: There are low lung volumes with bronchovascular crowding. Cardiomediastinal silhouette appears slightly enlarged. Right basilar linear opacities most suggestive of atelectasis but early developing infiltrate not  excluded. No sizable pleural effusions. No pneumothorax. No acute osseous abnormalities. IMPRESSION: Low lung  volumes with bronchovascular crowding. Linear right basilar opacities most suggestive of atelectasis, early developing infiltrate not excluded. Electronically Signed   By: Kristopher Oppenheim M.D.   On: 12/23/2016 15:29    Medications:  Prior to Admission:  Medications Prior to Admission  Medication Sig Dispense Refill Last Dose  . ABILIFY 10 MG tablet Take 5 mg by mouth 2 (two) times daily.    12/22/2016 at Unknown time  . amantadine (SYMMETREL) 100 MG capsule Take 100 mg by mouth 2 (two) times daily.    12/22/2016 at Unknown time  . clonazePAM (KLONOPIN) 0.5 MG tablet Take 0.5 mg by mouth 2 (two) times daily.   12/22/2016 at Unknown time  . dicyclomine (BENTYL) 10 MG capsule TAKE 1 CAPSULE BY MOUTH 4 TIMES DAILY WITH MEALS AND AT BEDTIME AS NEEDED. HOLD FOR CONSTIPATION. 120 capsule 11 12/22/2016 at Unknown time  . lisinopril (PRINIVIL,ZESTRIL) 10 MG tablet Take 10 mg by mouth daily.   12/22/2016 at Unknown time  . pantoprazole (PROTONIX) 40 MG tablet TAKE 1 TABLET BY MOUTH ONCE DAILY BEFORE BREAKFAST. 90 tablet 2 12/22/2016 at Unknown time  . ZOLOFT 100 MG tablet Take 150 mg by mouth daily.    12/22/2016 at Unknown time  . Cetirizine HCl (ZYRTEC ALLERGY) 10 MG CAPS Take 1 capsule (10 mg total) by mouth daily as needed (for itching). (Patient not taking: Reported on 12/23/2016) 14 capsule 0 Not Taking at Unknown time   Scheduled: . amantadine  100 mg Oral BID  . ARIPiprazole  5 mg Oral BID  . clonazePAM  0.5 mg Oral BID  . enoxaparin (LOVENOX) injection  40 mg Subcutaneous Q24H  . pantoprazole  40 mg Oral Daily  . sertraline  150 mg Oral Daily   Continuous: . 0.9 % NaCl with KCl 20 mEq / L 125 mL/hr at 12/24/16 0644  . piperacillin-tazobactam 3.375 g (12/24/16 0741)  . sodium chloride 1,000 mL (12/24/16 0735)   XGZ:[id number] **OR** acetaminophen, fentaNYL (SUBLIMAZE) injection,  ondansetron **OR** ondansetron (ZOFRAN) IV  Assesment: He has cholecystitis and sepsis from that.  He is not hypotensive now but he is still tachycardic.  At baseline he has hypertension and that is pretty well controlled.  Blood pressure now 115/72.  He has hypokalemia which is an ongoing issue he had hyponatremia and that has improved.  His white blood count has improved from 24,000 now to 21,100.  Chest x-ray that I personally reviewed shows atelectasis on the right likely from splinting of the diaphragm on that side. Active Problems:   Hypertension   Sepsis due to undetermined organism ([place])   Calculus of gallbladder with acute cholecystitis without obstruction   Hyponatremia   Hypokalemia    Plan: Give him another fluid bolus.  Check my outpatient records to see if he was on metoprolol.  Replace his potassium.  Continue Zosyn.  Add incentive spirometry.  Dr. Arnoldo Morale says it is okay for him to have clear liquid diet    LOS: 1 day   Sharda Keddy L 12/24/2016, 8:04 AM

## 2016-12-25 LAB — CBC WITH DIFFERENTIAL/PLATELET
BASOS ABS: 0 10*3/uL (ref 0.0–0.1)
BASOS PCT: 0 %
Eosinophils Absolute: 0.1 10*3/uL (ref 0.0–0.7)
Eosinophils Relative: 0 %
HEMATOCRIT: 32.7 % — AB (ref 39.0–52.0)
Hemoglobin: 10.9 g/dL — ABNORMAL LOW (ref 13.0–17.0)
LYMPHS PCT: 6 %
Lymphs Abs: 1 10*3/uL (ref 0.7–4.0)
MCH: 30.9 pg (ref 26.0–34.0)
MCHC: 33.3 g/dL (ref 30.0–36.0)
MCV: 92.6 fL (ref 78.0–100.0)
Monocytes Absolute: 1 10*3/uL (ref 0.1–1.0)
Monocytes Relative: 6 %
NEUTROS ABS: 14.3 10*3/uL — AB (ref 1.7–7.7)
NEUTROS PCT: 88 %
PLATELETS: 169 10*3/uL (ref 150–400)
RBC: 3.53 MIL/uL — AB (ref 4.22–5.81)
RDW: 14.6 % (ref 11.5–15.5)
WBC: 16.3 10*3/uL — AB (ref 4.0–10.5)

## 2016-12-25 LAB — BASIC METABOLIC PANEL
Anion gap: 6 (ref 5–15)
BUN: 9 mg/dL (ref 6–20)
CALCIUM: 8.7 mg/dL — AB (ref 8.9–10.3)
CHLORIDE: 110 mmol/L (ref 101–111)
CO2: 18 mmol/L — ABNORMAL LOW (ref 22–32)
CREATININE: 0.67 mg/dL (ref 0.61–1.24)
Glucose, Bld: 104 mg/dL — ABNORMAL HIGH (ref 65–99)
Potassium: 3.3 mmol/L — ABNORMAL LOW (ref 3.5–5.1)
SODIUM: 134 mmol/L — AB (ref 135–145)

## 2016-12-25 LAB — MAGNESIUM: MAGNESIUM: 1.7 mg/dL (ref 1.7–2.4)

## 2016-12-25 MED ORDER — CHLORHEXIDINE GLUCONATE CLOTH 2 % EX PADS: 6.0000 | MEDICATED_PAD | Freq: Once | CUTANEOUS | Status: AC

## 2016-12-25 MED ORDER — METOPROLOL TARTRATE 25 MG PO TABS
12.5000 mg | ORAL_TABLET | Freq: Two times a day (BID) | ORAL | Status: DC
  Administered 2016-12-25 (×2): 12.5 mg via ORAL
  Filled 2016-12-25 (×2): qty 1

## 2016-12-25 MED ORDER — POTASSIUM CHLORIDE 10 MEQ/100ML IV SOLN
10.0000 meq | INTRAVENOUS | Status: AC
  Administered 2016-12-25 (×3): 10 meq via INTRAVENOUS
  Filled 2016-12-25 (×3): qty 100

## 2016-12-25 MED ORDER — CHLORHEXIDINE GLUCONATE CLOTH 2 % EX PADS
6.0000 | MEDICATED_PAD | Freq: Once | CUTANEOUS | Status: AC
  Administered 2016-12-25: 6 via TOPICAL

## 2016-12-25 MED ORDER — CHLORHEXIDINE GLUCONATE CLOTH 2 % EX PADS
6.0000 | MEDICATED_PAD | Freq: Once | CUTANEOUS | Status: AC
  Administered 2016-12-26: 6 via TOPICAL

## 2016-12-25 MED ORDER — POTASSIUM CHLORIDE CRYS ER 20 MEQ PO TBCR
20.0000 meq | EXTENDED_RELEASE_TABLET | Freq: Two times a day (BID) | ORAL | Status: DC
  Administered 2016-12-25 – 2016-12-27 (×4): 20 meq via ORAL
  Filled 2016-12-25 (×4): qty 1

## 2016-12-25 NOTE — Progress Notes (Signed)
Subjective: He says he feels okay.  He still has tachycardia.  He has continued abdominal discomfort but not severe.  His blood pressure has been much improved.  He has no other complaints.  He says his breathing is okay.  Objective: Vital signs in last 24 hours: Temp:  [97.3 F (36.3 C)-101 F (38.3 C)] 97.3 F (36.3 C) (11/27 0400) Pulse Rate:  [109-142] 110 (11/27 0600) Resp:  [27-43] 28 (11/27 0600) BP: (105-140)/(59-86) 132/85 (11/27 0600) SpO2:  [88 %-95 %] 90 % (11/27 0600) Weight:  [110.7 kg (244 lb 0.8 oz)] 110.7 kg (244 lb 0.8 oz) (11/27 0500) Weight change: -0.431 kg (-15.2 oz) Last BM Date: 12/24/16  Intake/Output from previous day: 11/26 0701 - 11/27 0700 In: 4168.3 [P.O.:960; I.V.:2658.3; IV Piggyback:550] Out: 400 [Urine:400]  PHYSICAL EXAM General appearance: alert, cooperative and Obese Resp: clear to auscultation bilaterally Cardio: Regular with tachycardia GI: soft, non-tender; bowel sounds normal; no masses,  no organomegaly Extremities: extremities normal, atraumatic, no cyanosis or edema Skin warm and dry  Lab Results:  Results for orders placed or performed during the hospital encounter of 12/23/16 (from the past 48 hour(s))  Lipase, blood     Status: None   Collection Time: 12/23/16  9:14 AM  Result Value Ref Range   Lipase 20 11 - 51 U/L  Comprehensive metabolic panel     Status: Abnormal   Collection Time: 12/23/16  9:14 AM  Result Value Ref Range   Sodium 132 (L) 135 - 145 mmol/L   Potassium 3.4 (L) 3.5 - 5.1 mmol/L   Chloride 104 101 - 111 mmol/L   CO2 17 (L) 22 - 32 mmol/L   Glucose, Bld 143 (H) 65 - 99 mg/dL   BUN 19 6 - 20 mg/dL   Creatinine, Ser 1.11 0.61 - 1.24 mg/dL   Calcium 9.3 8.9 - 10.3 mg/dL   Total Protein 7.6 6.5 - 8.1 g/dL   Albumin 4.2 3.5 - 5.0 g/dL   AST 20 15 - 41 U/L   ALT 22 17 - 63 U/L   Alkaline Phosphatase 70 38 - 126 U/L   Total Bilirubin 0.9 0.3 - 1.2 mg/dL   GFR calc non Af Amer >60 >60 mL/min   GFR calc Af  Amer >60 >60 mL/min    Comment: (NOTE) The eGFR has been calculated using the CKD EPI equation. This calculation has not been validated in all clinical situations. eGFR's persistently <60 mL/min signify possible Chronic Kidney Disease.    Anion gap 11 5 - 15  CBC     Status: Abnormal   Collection Time: 12/23/16  9:14 AM  Result Value Ref Range   WBC 28.0 (H) 4.0 - 10.5 K/uL    Comment: WHITE COUNT CONFIRMED ON SMEAR   RBC 4.60 4.22 - 5.81 MIL/uL   Hemoglobin 14.5 13.0 - 17.0 g/dL   HCT 42.6 39.0 - 52.0 %   MCV 92.6 78.0 - 100.0 fL   MCH 31.5 26.0 - 34.0 pg   MCHC 34.0 30.0 - 36.0 g/dL   RDW 14.4 11.5 - 15.5 %   Platelets 202 150 - 400 K/uL  Urinalysis, Routine w reflex microscopic     Status: Abnormal   Collection Time: 12/23/16  9:18 AM  Result Value Ref Range   Color, Urine AMBER (A) YELLOW    Comment: BIOCHEMICALS MAY BE AFFECTED BY COLOR   APPearance HAZY (A) CLEAR   Specific Gravity, Urine 1.030 1.005 - 1.030   pH 5.0 5.0 -  8.0   Glucose, UA NEGATIVE NEGATIVE mg/dL   Hgb urine dipstick NEGATIVE NEGATIVE   Bilirubin Urine NEGATIVE NEGATIVE   Ketones, ur NEGATIVE NEGATIVE mg/dL   Protein, ur 30 (A) NEGATIVE mg/dL   Nitrite NEGATIVE NEGATIVE   Leukocytes, UA NEGATIVE NEGATIVE   RBC / HPF 0-5 0 - 5 RBC/hpf   WBC, UA 0-5 0 - 5 WBC/hpf   Bacteria, UA NONE SEEN NONE SEEN   Squamous Epithelial / LPF 0-5 (A) NONE SEEN   Mucus PRESENT   Culture, blood (Routine X 2) w Reflex to ID Panel     Status: None (Preliminary result)   Collection Time: 12/23/16 11:46 AM  Result Value Ref Range   Specimen Description BLOOD LEFT ARM    Special Requests      BOTTLES DRAWN AEROBIC AND ANAEROBIC Blood Culture adequate volume   Culture NO GROWTH < 24 HOURS    Report Status PENDING   Culture, blood (Routine X 2) w Reflex to ID Panel     Status: None (Preliminary result)   Collection Time: 12/23/16 11:52 AM  Result Value Ref Range   Specimen Description BLOOD LEFT ARM    Special Requests       BOTTLES DRAWN AEROBIC AND ANAEROBIC Blood Culture adequate volume   Culture NO GROWTH < 24 HOURS    Report Status PENDING   Lactic acid, plasma     Status: None   Collection Time: 12/23/16 11:55 AM  Result Value Ref Range   Lactic Acid, Venous 1.8 0.5 - 1.9 mmol/L  MRSA PCR Screening     Status: None   Collection Time: 12/23/16  2:50 PM  Result Value Ref Range   MRSA by PCR NEGATIVE NEGATIVE    Comment:        The GeneXpert MRSA Assay (FDA approved for NASAL specimens only), is one component of a comprehensive MRSA colonization surveillance program. It is not intended to diagnose MRSA infection nor to guide or monitor treatment for MRSA infections.   Lactic acid, plasma     Status: None   Collection Time: 12/23/16  2:55 PM  Result Value Ref Range   Lactic Acid, Venous 1.1 0.5 - 1.9 mmol/L  CBC with Differential     Status: Abnormal   Collection Time: 12/23/16  2:57 PM  Result Value Ref Range   WBC 24.0 (H) 4.0 - 10.5 K/uL   RBC 4.06 (L) 4.22 - 5.81 MIL/uL   Hemoglobin 12.7 (L) 13.0 - 17.0 g/dL   HCT 37.6 (L) 39.0 - 52.0 %   MCV 92.6 78.0 - 100.0 fL   MCH 31.3 26.0 - 34.0 pg   MCHC 33.8 30.0 - 36.0 g/dL   RDW 14.3 11.5 - 15.5 %   Platelets 171 150 - 400 K/uL   Neutrophils Relative % 85 %   Neutro Abs 20.4 (H) 1.7 - 7.7 K/uL   Lymphocytes Relative 6 %   Lymphs Abs 1.5 0.7 - 4.0 K/uL   Monocytes Relative 9 %   Monocytes Absolute 2.1 (H) 0.1 - 1.0 K/uL   Eosinophils Relative 0 %   Eosinophils Absolute 0.0 0.0 - 0.7 K/uL   Basophils Relative 0 %   Basophils Absolute 0.0 0.0 - 0.1 K/uL  Comprehensive metabolic panel     Status: Abnormal   Collection Time: 12/23/16  2:57 PM  Result Value Ref Range   Sodium 133 (L) 135 - 145 mmol/L   Potassium 3.2 (L) 3.5 - 5.1 mmol/L   Chloride 105  101 - 111 mmol/L   CO2 19 (L) 22 - 32 mmol/L   Glucose, Bld 99 65 - 99 mg/dL   BUN 17 6 - 20 mg/dL   Creatinine, Ser 0.84 0.61 - 1.24 mg/dL   Calcium 8.7 (L) 8.9 - 10.3 mg/dL    Total Protein 6.7 6.5 - 8.1 g/dL   Albumin 3.5 3.5 - 5.0 g/dL   AST 17 15 - 41 U/L   ALT 21 17 - 63 U/L   Alkaline Phosphatase 58 38 - 126 U/L   Total Bilirubin 1.0 0.3 - 1.2 mg/dL   GFR calc non Af Amer >60 >60 mL/min   GFR calc Af Amer >60 >60 mL/min    Comment: (NOTE) The eGFR has been calculated using the CKD EPI equation. This calculation has not been validated in all clinical situations. eGFR's persistently <60 mL/min signify possible Chronic Kidney Disease.    Anion gap 9 5 - 15  Procalcitonin     Status: None   Collection Time: 12/23/16  2:57 PM  Result Value Ref Range   Procalcitonin 9.94 ng/mL    Comment:        Interpretation: PCT > 2 ng/mL: Systemic infection (sepsis) is likely, unless other causes are known. (NOTE)         ICU PCT Algorithm               Non ICU PCT Algorithm    ----------------------------     ------------------------------         PCT < 0.25 ng/mL                 PCT < 0.1 ng/mL     Stopping of antibiotics            Stopping of antibiotics       strongly encouraged.               strongly encouraged.    ----------------------------     ------------------------------       PCT level decrease by               PCT < 0.25 ng/mL       >= 80% from peak PCT       OR PCT 0.25 - 0.5 ng/mL          Stopping of antibiotics                                             encouraged.     Stopping of antibiotics           encouraged.    ----------------------------     ------------------------------       PCT level decrease by              PCT >= 0.25 ng/mL       < 80% from peak PCT        AND PCT >= 0.5 ng/mL            Continuing antibiotics                                               encouraged.       Continuing antibiotics  encouraged.    ----------------------------     ------------------------------     PCT level increase compared          PCT > 0.5 ng/mL         with peak PCT AND          PCT >= 0.5 ng/mL             Escalation of  antibiotics                                          strongly encouraged.      Escalation of antibiotics        strongly encouraged.   Protime-INR     Status: Abnormal   Collection Time: 12/23/16  2:57 PM  Result Value Ref Range   Prothrombin Time 16.6 (H) 11.4 - 15.2 seconds   INR 1.36   APTT     Status: None   Collection Time: 12/23/16  2:57 PM  Result Value Ref Range   aPTT 34 24 - 36 seconds  HIV antibody (Routine Testing)     Status: None   Collection Time: 12/23/16  2:57 PM  Result Value Ref Range   HIV Screen 4th Generation wRfx Non Reactive Non Reactive    Comment: (NOTE) Performed At: [facility] [place], [place] [id number] Rush Farmer MD OJ:[id number]   Magnesium     Status: Abnormal   Collection Time: 12/23/16  2:57 PM  Result Value Ref Range   Magnesium 1.6 (L) 1.7 - 2.4 mg/dL  Lactic acid, plasma     Status: None   Collection Time: 12/23/16  5:55 PM  Result Value Ref Range   Lactic Acid, Venous 1.3 0.5 - 1.9 mmol/L  CBC     Status: Abnormal   Collection Time: 12/24/16  4:45 AM  Result Value Ref Range   WBC 21.1 (H) 4.0 - 10.5 K/uL   RBC 4.01 (L) 4.22 - 5.81 MIL/uL   Hemoglobin 12.4 (L) 13.0 - 17.0 g/dL   HCT 37.3 (L) 39.0 - 52.0 %   MCV 93.0 78.0 - 100.0 fL   MCH 30.9 26.0 - 34.0 pg   MCHC 33.2 30.0 - 36.0 g/dL   RDW 14.5 11.5 - 15.5 %   Platelets 175 150 - 400 K/uL  Comprehensive metabolic panel     Status: Abnormal   Collection Time: 12/24/16  4:45 AM  Result Value Ref Range   Sodium 135 135 - 145 mmol/L   Potassium 3.2 (L) 3.5 - 5.1 mmol/L   Chloride 106 101 - 111 mmol/L   CO2 21 (L) 22 - 32 mmol/L   Glucose, Bld 104 (H) 65 - 99 mg/dL   BUN 17 6 - 20 mg/dL   Creatinine, Ser 0.87 0.61 - 1.24 mg/dL   Calcium 8.6 (L) 8.9 - 10.3 mg/dL   Total Protein 6.7 6.5 - 8.1 g/dL   Albumin 3.3 (L) 3.5 - 5.0 g/dL   AST 16 15 - 41 U/L   ALT 19 17 - 63 U/L   Alkaline Phosphatase 58 38 - 126 U/L   Total Bilirubin 0.9 0.3 - 1.2 mg/dL    GFR calc non Af Amer >60 >60 mL/min   GFR calc Af Amer >60 >60 mL/min    Comment: (NOTE) The eGFR has been calculated using the CKD EPI equation. This calculation has not been validated  in all clinical situations. eGFR's persistently <60 mL/min signify possible Chronic Kidney Disease.    Anion gap 8 5 - 15  CBC with Differential/Platelet     Status: Abnormal   Collection Time: 12/25/16  5:59 AM  Result Value Ref Range   WBC 16.3 (H) 4.0 - 10.5 K/uL   RBC 3.53 (L) 4.22 - 5.81 MIL/uL   Hemoglobin 10.9 (L) 13.0 - 17.0 g/dL   HCT 32.7 (L) 39.0 - 52.0 %   MCV 92.6 78.0 - 100.0 fL   MCH 30.9 26.0 - 34.0 pg   MCHC 33.3 30.0 - 36.0 g/dL   RDW 14.6 11.5 - 15.5 %   Platelets 169 150 - 400 K/uL   Neutrophils Relative % 88 %   Neutro Abs 14.3 (H) 1.7 - 7.7 K/uL   Lymphocytes Relative 6 %   Lymphs Abs 1.0 0.7 - 4.0 K/uL   Monocytes Relative 6 %   Monocytes Absolute 1.0 0.1 - 1.0 K/uL   Eosinophils Relative 0 %   Eosinophils Absolute 0.1 0.0 - 0.7 K/uL   Basophils Relative 0 %   Basophils Absolute 0.0 0.0 - 0.1 K/uL  Basic metabolic panel     Status: Abnormal   Collection Time: 12/25/16  5:59 AM  Result Value Ref Range   Sodium 134 (L) 135 - 145 mmol/L   Potassium 3.3 (L) 3.5 - 5.1 mmol/L   Chloride 110 101 - 111 mmol/L   CO2 18 (L) 22 - 32 mmol/L   Glucose, Bld 104 (H) 65 - 99 mg/dL   BUN 9 6 - 20 mg/dL   Creatinine, Ser 0.67 0.61 - 1.24 mg/dL   Calcium 8.7 (L) 8.9 - 10.3 mg/dL   GFR calc non Af Amer >60 >60 mL/min   GFR calc Af Amer >60 >60 mL/min    Comment: (NOTE) The eGFR has been calculated using the CKD EPI equation. This calculation has not been validated in all clinical situations. eGFR's persistently <60 mL/min signify possible Chronic Kidney Disease.    Anion gap 6 5 - 15    ABGS No results for input(s): PHART, PO2ART, TCO2, HCO3 in the last 72 hours.  Invalid input(s): PCO2 CULTURES Recent Results (from the past 240 hour(s))  Culture, blood (Routine X 2) w  Reflex to ID Panel     Status: None (Preliminary result)   Collection Time: 12/23/16 11:46 AM  Result Value Ref Range Status   Specimen Description BLOOD LEFT ARM  Final   Special Requests   Final    BOTTLES DRAWN AEROBIC AND ANAEROBIC Blood Culture adequate volume   Culture NO GROWTH < 24 HOURS  Final   Report Status PENDING  Incomplete  Culture, blood (Routine X 2) w Reflex to ID Panel     Status: None (Preliminary result)   Collection Time: 12/23/16 11:52 AM  Result Value Ref Range Status   Specimen Description BLOOD LEFT ARM  Final   Special Requests   Final    BOTTLES DRAWN AEROBIC AND ANAEROBIC Blood Culture adequate volume   Culture NO GROWTH < 24 HOURS  Final   Report Status PENDING  Incomplete  MRSA PCR Screening     Status: None   Collection Time: 12/23/16  2:50 PM  Result Value Ref Range Status   MRSA by PCR NEGATIVE NEGATIVE Final    Comment:        The GeneXpert MRSA Assay (FDA approved for NASAL specimens only), is one component of a comprehensive MRSA colonization surveillance  program. It is not intended to diagnose MRSA infection nor to guide or monitor treatment for MRSA infections.    Studies/Results: Ct Abdomen Pelvis W Contrast  Result Date: 12/23/2016 CLINICAL DATA:  Abdominal pain radiating into the low back since yesterday. EXAM: CT ABDOMEN AND PELVIS WITH CONTRAST TECHNIQUE: Multidetector CT imaging of the abdomen and pelvis was performed using the standard protocol following bolus administration of intravenous contrast. CONTRAST:  100 ml ISOVUE-300 IOPAMIDOL (ISOVUE-300) INJECTION 61% COMPARISON:  CT abdomen and pelvis 09/05/2014. FINDINGS: Lower chest: Heart size is normal. There is some dependent atelectasis. No pleural or pericardial effusion. Hepatobiliary: Multiple small stones are seen within the gallbladder. There is extensive stranding about the gallbladder and the gallbladder wall is thickened. Mild fatty infiltration of the liver without focal  lesion is seen. The biliary tree is unremarkable. Pancreas: Unremarkable. No pancreatic ductal dilatation or surrounding inflammatory changes. Spleen: Normal in size without focal abnormality. Adrenals/Urinary Tract: Adrenal glands are unremarkable. Kidneys are normal, without renal calculi, focal lesion, or hydronephrosis. Bladder is unremarkable. Stomach/Bowel: Stomach is within normal limits. Appendix appears normal. No evidence of small bowel obstruction. There is stranding about the proximal duodenum. The colon and appendix appear normal. Vascular/Lymphatic: No significant vascular findings are present. No enlarged abdominal or pelvic lymph nodes. Reproductive: Prostate is unremarkable. Other: Trace amount of free pelvic fluid is identified. Tiny fat containing umbilical hernia is seen. Musculoskeletal: Negative. IMPRESSION: Findings most consistent with acute cholecystitis with multiple small stones identified in the gallbladder. Stranding about the proximal duodenum is likely secondary to cholecystitis rather than duodenitis. Right upper quadrant ultrasound could be used for further evaluation. Mild fatty infiltration of the liver. Electronically Signed   By: Inge Rise M.D.   On: 12/23/2016 11:08   Dg Chest Port 1 View  Result Date: 12/23/2016 CLINICAL DATA:  36 year old male with sepsis. EXAM: PORTABLE CHEST 1 VIEW COMPARISON:  12/22/2013 FINDINGS: There are low lung volumes with bronchovascular crowding. Cardiomediastinal silhouette appears slightly enlarged. Right basilar linear opacities most suggestive of atelectasis but early developing infiltrate not excluded. No sizable pleural effusions. No pneumothorax. No acute osseous abnormalities. IMPRESSION: Low lung volumes with bronchovascular crowding. Linear right basilar opacities most suggestive of atelectasis, early developing infiltrate not excluded. Electronically Signed   By: Kristopher Oppenheim M.D.   On: 12/23/2016 15:29    Medications:   Prior to Admission:  Medications Prior to Admission  Medication Sig Dispense Refill Last Dose  . ABILIFY 10 MG tablet Take 5 mg by mouth 2 (two) times daily.    12/22/2016 at Unknown time  . amantadine (SYMMETREL) 100 MG capsule Take 100 mg by mouth 2 (two) times daily.    12/22/2016 at Unknown time  . clonazePAM (KLONOPIN) 0.5 MG tablet Take 0.5 mg by mouth 2 (two) times daily.   12/22/2016 at Unknown time  . dicyclomine (BENTYL) 10 MG capsule TAKE 1 CAPSULE BY MOUTH 4 TIMES DAILY WITH MEALS AND AT BEDTIME AS NEEDED. HOLD FOR CONSTIPATION. 120 capsule 11 12/22/2016 at Unknown time  . lisinopril (PRINIVIL,ZESTRIL) 10 MG tablet Take 10 mg by mouth daily.   12/22/2016 at Unknown time  . pantoprazole (PROTONIX) 40 MG tablet TAKE 1 TABLET BY MOUTH ONCE DAILY BEFORE BREAKFAST. 90 tablet 2 12/22/2016 at Unknown time  . ZOLOFT 100 MG tablet Take 150 mg by mouth daily.    12/22/2016 at Unknown time  . Cetirizine HCl (ZYRTEC ALLERGY) 10 MG CAPS Take 1 capsule (10 mg total) by mouth daily as needed (  for itching). (Patient not taking: Reported on 12/23/2016) 14 capsule 0 Not Taking at Unknown time   Scheduled: . amantadine  100 mg Oral BID  . ARIPiprazole  5 mg Oral BID  . clonazePAM  0.5 mg Oral BID  . enoxaparin (LOVENOX) injection  40 mg Subcutaneous Q24H  . metoprolol tartrate  12.5 mg Oral BID  . pantoprazole  40 mg Oral Daily  . potassium chloride  20 mEq Oral BID  . sertraline  150 mg Oral Daily   Continuous: . 0.9 % NaCl with KCl 20 mEq / L 125 mL/hr at 12/25/16 0400  . piperacillin-tazobactam Stopped (12/25/16 0341)   IDP:OEUMPNTIRWERX **OR** acetaminophen, fentaNYL (SUBLIMAZE) injection, ondansetron **OR** ondansetron (ZOFRAN) IV  Assesment: He was admitted with sepsis related to acute cholecystitis.  He no longer appears septic.  His blood pressures in the 120-130 range.  However he does still have tachycardia.  He had been on metoprolol in the past but that had been discontinued  because he had fatigue from that.  At baseline he has hypertension and his blood pressure has climbed into the systolic 540-086 but his diastolic is about 90.  He still has abdominal pain but less.  He still has low potassium despite receiving IV potassium with his IV fluids. Active Problems:   Hypertension   Sepsis due to undetermined organism ([place])   Calculus of gallbladder with acute cholecystitis without obstruction   Hyponatremia   Hypokalemia    Plan: Start metoprolol 12.5 mg twice daily.  Continue other treatments.  Replace potassium orally as well as with his IV fluids.  Decrease IV fluids.  Potential surgery tomorrow    LOS: 2 days   , L 12/25/2016, 7:47 AM

## 2016-12-25 NOTE — Progress Notes (Signed)
Subjective: Patient's abdominal pain has improved.  Objective: Vital signs in last 24 hours: Temp:  [97.3 F (36.3 C)-101 F (38.3 C)] 97.3 F (36.3 C) (11/27 0400) Pulse Rate:  [109-142] 130 (11/27 0816) Resp:  [17-43] 17 (11/27 0816) BP: (105-142)/(59-86) 142/83 (11/27 0800) SpO2:  [88 %-95 %] 95 % (11/27 0816) Weight:  [244 lb 0.8 oz (110.7 kg)] 244 lb 0.8 oz (110.7 kg) (11/27 0500) Last BM Date: 12/24/16  Intake/Output from previous day: 11/26 0701 - 11/27 0700 In: 4168.3 [P.O.:960; I.V.:2658.3; IV Piggyback:550] Out: 400 [Urine:400] Intake/Output this shift: No intake/output data recorded.  General appearance: alert, cooperative and no distress GI: Soft, slightly tender in the right upper quadrant to palpation.  No rigidity noted.  Lab Results:  Recent Labs    12/24/16 0445 12/25/16 0559  WBC 21.1* 16.3*  HGB 12.4* 10.9*  HCT 37.3* 32.7*  PLT 175 169   BMET Recent Labs    12/24/16 0445 12/25/16 0559  NA 135 134*  K 3.2* 3.3*  CL 106 110  CO2 21* 18*  GLUCOSE 104* 104*  BUN 17 9  CREATININE 0.87 0.67  CALCIUM 8.6* 8.7*   PT/INR Recent Labs    12/23/16 1457  LABPROT 16.6*  INR 1.36    Studies/Results: Ct Abdomen Pelvis W Contrast  Result Date: 12/23/2016 CLINICAL DATA:  Abdominal pain radiating into the low back since yesterday. EXAM: CT ABDOMEN AND PELVIS WITH CONTRAST TECHNIQUE: Multidetector CT imaging of the abdomen and pelvis was performed using the standard protocol following bolus administration of intravenous contrast. CONTRAST:  100 ml ISOVUE-300 IOPAMIDOL (ISOVUE-300) INJECTION 61% COMPARISON:  CT abdomen and pelvis 09/05/2014. FINDINGS: Lower chest: Heart size is normal. There is some dependent atelectasis. No pleural or pericardial effusion. Hepatobiliary: Multiple small stones are seen within the gallbladder. There is extensive stranding about the gallbladder and the gallbladder wall is thickened. Mild fatty infiltration of the liver  without focal lesion is seen. The biliary tree is unremarkable. Pancreas: Unremarkable. No pancreatic ductal dilatation or surrounding inflammatory changes. Spleen: Normal in size without focal abnormality. Adrenals/Urinary Tract: Adrenal glands are unremarkable. Kidneys are normal, without renal calculi, focal lesion, or hydronephrosis. Bladder is unremarkable. Stomach/Bowel: Stomach is within normal limits. Appendix appears normal. No evidence of small bowel obstruction. There is stranding about the proximal duodenum. The colon and appendix appear normal. Vascular/Lymphatic: No significant vascular findings are present. No enlarged abdominal or pelvic lymph nodes. Reproductive: Prostate is unremarkable. Other: Trace amount of free pelvic fluid is identified. Tiny fat containing umbilical hernia is seen. Musculoskeletal: Negative. IMPRESSION: Findings most consistent with acute cholecystitis with multiple small stones identified in the gallbladder. Stranding about the proximal duodenum is likely secondary to cholecystitis rather than duodenitis. Right upper quadrant ultrasound could be used for further evaluation. Mild fatty infiltration of the liver. Electronically Signed   By: Drusilla Kannerhomas  Dalessio M.D.   On: 12/23/2016 11:08   Dg Chest Port 1 View  Result Date: 12/23/2016 CLINICAL DATA:  36 year old male with sepsis. EXAM: PORTABLE CHEST 1 VIEW COMPARISON:  12/22/2013 FINDINGS: There are low lung volumes with bronchovascular crowding. Cardiomediastinal silhouette appears slightly enlarged. Right basilar linear opacities most suggestive of atelectasis but early developing infiltrate not excluded. No sizable pleural effusions. No pneumothorax. No acute osseous abnormalities. IMPRESSION: Low lung volumes with bronchovascular crowding. Linear right basilar opacities most suggestive of atelectasis, early developing infiltrate not excluded. Electronically Signed   By: Sande BrothersSerena  Chacko M.D.   On: 12/23/2016 15:29     Anti-infectives:  Anti-infectives (From admission, onward)   Start     Dose/Rate Route Frequency Ordered Stop   12/24/16 0000  piperacillin-tazobactam (ZOSYN) IVPB 3.375 g     3.375 g 12.5 mL/hr over 240 Minutes Intravenous Every 8 hours 12/23/16 1633     12/23/16 1500  piperacillin-tazobactam (ZOSYN) IVPB 3.375 g     3.375 g 100 mL/hr over 30 Minutes Intravenous  Once 12/23/16 1454 12/23/16 1730   12/23/16 1115  piperacillin-tazobactam (ZOSYN) IVPB 3.375 g     3.375 g 100 mL/hr over 30 Minutes Intravenous  Once 12/23/16 1112 12/23/16 1155      Assessment/Plan: Impression: Acute cholecystitis, cholelithiasis.  Hypotension has resolved.  Still tachycardic.  Has been started on metoprolol.  Still mildly hypokalemic.  Will check magnesium level.  We will proceed with laparoscopic cholecystectomy tomorrow.  The risks and benefits of the procedure including bleeding, infection, hepatobiliary injury, and the possibility of an open procedure were fully explained to the patient, who gave informed consent.  LOS: 2 days    Bobby Jimenez 12/25/2016

## 2016-12-25 NOTE — H&P (View-Only) (Signed)
Subjective: Patient's abdominal pain has improved.  Objective: Vital signs in last 24 hours: Temp:  [97.3 F (36.3 C)-101 F (38.3 C)] 97.3 F (36.3 C) (11/27 0400) Pulse Rate:  [109-142] 130 (11/27 0816) Resp:  [17-43] 17 (11/27 0816) BP: (105-142)/(59-86) 142/83 (11/27 0800) SpO2:  [88 %-95 %] 95 % (11/27 0816) Weight:  [244 lb 0.8 oz (110.7 kg)] 244 lb 0.8 oz (110.7 kg) (11/27 0500) Last BM Date: 12/24/16  Intake/Output from previous day: 11/26 0701 - 11/27 0700 In: 4168.3 [P.O.:960; I.V.:2658.3; IV Piggyback:550] Out: 400 [Urine:400] Intake/Output this shift: No intake/output data recorded.  General appearance: alert, cooperative and no distress GI: Soft, slightly tender in the right upper quadrant to palpation.  No rigidity noted.  Lab Results:  Recent Labs    12/24/16 0445 12/25/16 0559  WBC 21.1* 16.3*  HGB 12.4* 10.9*  HCT 37.3* 32.7*  PLT 175 169   BMET Recent Labs    12/24/16 0445 12/25/16 0559  NA 135 134*  K 3.2* 3.3*  CL 106 110  CO2 21* 18*  GLUCOSE 104* 104*  BUN 17 9  CREATININE 0.87 0.67  CALCIUM 8.6* 8.7*   PT/INR Recent Labs    12/23/16 1457  LABPROT 16.6*  INR 1.36    Studies/Results: Ct Abdomen Pelvis W Contrast  Result Date: 12/23/2016 CLINICAL DATA:  Abdominal pain radiating into the low back since yesterday. EXAM: CT ABDOMEN AND PELVIS WITH CONTRAST TECHNIQUE: Multidetector CT imaging of the abdomen and pelvis was performed using the standard protocol following bolus administration of intravenous contrast. CONTRAST:  100 ml ISOVUE-300 IOPAMIDOL (ISOVUE-300) INJECTION 61% COMPARISON:  CT abdomen and pelvis 09/05/2014. FINDINGS: Lower chest: Heart size is normal. There is some dependent atelectasis. No pleural or pericardial effusion. Hepatobiliary: Multiple small stones are seen within the gallbladder. There is extensive stranding about the gallbladder and the gallbladder wall is thickened. Mild fatty infiltration of the liver  without focal lesion is seen. The biliary tree is unremarkable. Pancreas: Unremarkable. No pancreatic ductal dilatation or surrounding inflammatory changes. Spleen: Normal in size without focal abnormality. Adrenals/Urinary Tract: Adrenal glands are unremarkable. Kidneys are normal, without renal calculi, focal lesion, or hydronephrosis. Bladder is unremarkable. Stomach/Bowel: Stomach is within normal limits. Appendix appears normal. No evidence of small bowel obstruction. There is stranding about the proximal duodenum. The colon and appendix appear normal. Vascular/Lymphatic: No significant vascular findings are present. No enlarged abdominal or pelvic lymph nodes. Reproductive: Prostate is unremarkable. Other: Trace amount of free pelvic fluid is identified. Tiny fat containing umbilical hernia is seen. Musculoskeletal: Negative. IMPRESSION: Findings most consistent with acute cholecystitis with multiple small stones identified in the gallbladder. Stranding about the proximal duodenum is likely secondary to cholecystitis rather than duodenitis. Right upper quadrant ultrasound could be used for further evaluation. Mild fatty infiltration of the liver. Electronically Signed   By: Thomas  Dalessio M.D.   On: 12/23/2016 11:08   Dg Chest Port 1 View  Result Date: 12/23/2016 CLINICAL DATA:  35-year-old male with sepsis. EXAM: PORTABLE CHEST 1 VIEW COMPARISON:  12/22/2013 FINDINGS: There are low lung volumes with bronchovascular crowding. Cardiomediastinal silhouette appears slightly enlarged. Right basilar linear opacities most suggestive of atelectasis but early developing infiltrate not excluded. No sizable pleural effusions. No pneumothorax. No acute osseous abnormalities. IMPRESSION: Low lung volumes with bronchovascular crowding. Linear right basilar opacities most suggestive of atelectasis, early developing infiltrate not excluded. Electronically Signed   By: Serena  Chacko M.D.   On: 12/23/2016 15:29     Anti-infectives:   Anti-infectives (From admission, onward)   Start     Dose/Rate Route Frequency Ordered Stop   12/24/16 0000  piperacillin-tazobactam (ZOSYN) IVPB 3.375 g     3.375 g 12.5 mL/hr over 240 Minutes Intravenous Every 8 hours 12/23/16 1633     12/23/16 1500  piperacillin-tazobactam (ZOSYN) IVPB 3.375 g     3.375 g 100 mL/hr over 30 Minutes Intravenous  Once 12/23/16 1454 12/23/16 1730   12/23/16 1115  piperacillin-tazobactam (ZOSYN) IVPB 3.375 g     3.375 g 100 mL/hr over 30 Minutes Intravenous  Once 12/23/16 1112 12/23/16 1155      Assessment/Plan: Impression: Acute cholecystitis, cholelithiasis.  Hypotension has resolved.  Still tachycardic.  Has been started on metoprolol.  Still mildly hypokalemic.  Will check magnesium level.  We will proceed with laparoscopic cholecystectomy tomorrow.  The risks and benefits of the procedure including bleeding, infection, hepatobiliary injury, and the possibility of an open procedure were fully explained to the patient, who gave informed consent.  LOS: 2 days    Franky MachoMark Tahirih Lair 12/25/2016

## 2016-12-26 ENCOUNTER — Inpatient Hospital Stay (HOSPITAL_COMMUNITY): Payer: Medicare Other | Admitting: Anesthesiology

## 2016-12-26 ENCOUNTER — Encounter (HOSPITAL_COMMUNITY): Payer: Self-pay

## 2016-12-26 ENCOUNTER — Inpatient Hospital Stay (HOSPITAL_COMMUNITY): Payer: Medicare Other

## 2016-12-26 ENCOUNTER — Encounter (HOSPITAL_COMMUNITY)
Admission: EM | Disposition: A | Discharge status: Home / Self Care | Payer: Self-pay | Source: Home / Self Care | Attending: Pulmonary Disease

## 2016-12-26 DIAGNOSIS — K810 Acute cholecystitis: Secondary | ICD-10-CM | POA: Diagnosis present

## 2016-12-26 DIAGNOSIS — K8000 Calculus of gallbladder with acute cholecystitis without obstruction: Secondary | ICD-10-CM

## 2016-12-26 HISTORY — PX: CHOLECYSTECTOMY: SHX55

## 2016-12-26 LAB — COMPREHENSIVE METABOLIC PANEL
ALK PHOS: 67 U/L (ref 38–126)
ALT: 25 U/L (ref 17–63)
AST: 19 U/L (ref 15–41)
Albumin: 2.9 g/dL — ABNORMAL LOW (ref 3.5–5.0)
Anion gap: 8 (ref 5–15)
BUN: 7 mg/dL (ref 6–20)
CALCIUM: 8.7 mg/dL — AB (ref 8.9–10.3)
CHLORIDE: 107 mmol/L (ref 101–111)
CO2: 19 mmol/L — AB (ref 22–32)
CREATININE: 0.64 mg/dL (ref 0.61–1.24)
GFR calc Af Amer: >60 mL/min (ref >60)
Glucose, Bld: 99 mg/dL (ref 65–99)
Potassium: 3.4 mmol/L — ABNORMAL LOW (ref 3.5–5.1)
Sodium: 134 mmol/L — ABNORMAL LOW (ref 135–145)
Total Bilirubin: 0.6 mg/dL (ref 0.3–1.2)
Total Protein: 6.7 g/dL (ref 6.5–8.1)

## 2016-12-26 LAB — CBC WITH DIFFERENTIAL/PLATELET
BASOS ABS: 0 10*3/uL (ref 0.0–0.1)
Basophils Relative: 0 %
EOS PCT: 1 %
Eosinophils Absolute: 0.2 10*3/uL (ref 0.0–0.7)
HCT: 31.6 % — ABNORMAL LOW (ref 39.0–52.0)
HEMOGLOBIN: 10.7 g/dL — AB (ref 13.0–17.0)
LYMPHS ABS: 1 10*3/uL (ref 0.7–4.0)
LYMPHS PCT: 7 %
MCH: 31.3 pg (ref 26.0–34.0)
MCHC: 33.9 g/dL (ref 30.0–36.0)
MCV: 92.4 fL (ref 78.0–100.0)
Monocytes Absolute: 1.1 10*3/uL — ABNORMAL HIGH (ref 0.1–1.0)
Monocytes Relative: 8 %
NEUTROS ABS: 11.8 10*3/uL — AB (ref 1.7–7.7)
NEUTROS PCT: 84 %
PLATELETS: 221 10*3/uL (ref 150–400)
RBC: 3.42 MIL/uL — AB (ref 4.22–5.81)
RDW: 14.8 % (ref 11.5–15.5)
WBC: 14 10*3/uL — AB (ref 4.0–10.5)

## 2016-12-26 SURGERY — LAPAROSCOPIC CHOLECYSTECTOMY
Anesthesia: General | Site: Abdomen

## 2016-12-26 MED ORDER — METOPROLOL TARTRATE 25 MG PO TABS
25.0000 mg | ORAL_TABLET | ORAL | Status: AC
  Administered 2016-12-26: 25 mg via ORAL
  Filled 2016-12-26: qty 1

## 2016-12-26 MED ORDER — PROPOFOL 10 MG/ML IV BOLUS
INTRAVENOUS | Status: AC
  Filled 2016-12-26: qty 40

## 2016-12-26 MED ORDER — FENTANYL CITRATE (PF) 100 MCG/2ML IJ SOLN: 25.0000 ug | INTRAMUSCULAR | Status: DC | PRN

## 2016-12-26 MED ORDER — SODIUM CHLORIDE 0.9% FLUSH
INTRAVENOUS | Status: AC
  Filled 2016-12-26: qty 20

## 2016-12-26 MED ORDER — MIDAZOLAM HCL 2 MG/2ML IJ SOLN
1.0000 mg | INTRAMUSCULAR | Status: DC
  Administered 2016-12-26: 2 mg via INTRAVENOUS

## 2016-12-26 MED ORDER — SUCCINYLCHOLINE 20MG/ML (10ML) SYRINGE FOR MEDFUSION PUMP - OPTIME
INTRAMUSCULAR | Status: DC | PRN
  Administered 2016-12-26: 120 mg via INTRAVENOUS

## 2016-12-26 MED ORDER — FENTANYL CITRATE (PF) 100 MCG/2ML IJ SOLN
INTRAMUSCULAR | Status: DC | PRN
  Administered 2016-12-26 (×5): 50 ug via INTRAVENOUS

## 2016-12-26 MED ORDER — LIDOCAINE HCL (CARDIAC) 10 MG/ML IV SOLN
INTRAVENOUS | Status: DC | PRN
  Administered 2016-12-26: 50 mg via INTRAVENOUS

## 2016-12-26 MED ORDER — POVIDONE-IODINE 10 % EX OINT
TOPICAL_OINTMENT | CUTANEOUS | Status: AC
  Filled 2016-12-26: qty 1

## 2016-12-26 MED ORDER — HEMOSTATIC AGENTS (NO CHARGE) OPTIME
TOPICAL | Status: DC | PRN
  Administered 2016-12-26 (×2): 1 via TOPICAL

## 2016-12-26 MED ORDER — BUPIVACAINE HCL (PF) 0.5 % IJ SOLN
INTRAMUSCULAR | Status: AC
  Filled 2016-12-26: qty 30

## 2016-12-26 MED ORDER — SODIUM CHLORIDE 0.9 % IJ SOLN
INTRAMUSCULAR | Status: AC
  Filled 2016-12-26: qty 10

## 2016-12-26 MED ORDER — KETOROLAC TROMETHAMINE 30 MG/ML IJ SOLN
30.0000 mg | Freq: Once | INTRAMUSCULAR | Status: AC
  Administered 2016-12-26: 30 mg via INTRAVENOUS
  Filled 2016-12-26: qty 1

## 2016-12-26 MED ORDER — MIDAZOLAM HCL 2 MG/2ML IJ SOLN
INTRAMUSCULAR | Status: AC
  Filled 2016-12-26: qty 2

## 2016-12-26 MED ORDER — NEOSTIGMINE METHYLSULFATE 10 MG/10ML IV SOLN
INTRAVENOUS | Status: AC
  Filled 2016-12-26: qty 1

## 2016-12-26 MED ORDER — LACTATED RINGERS IV SOLN
INTRAVENOUS | Status: DC
  Administered 2016-12-26 (×2): via INTRAVENOUS

## 2016-12-26 MED ORDER — ONDANSETRON HCL 4 MG/2ML IJ SOLN
4.0000 mg | Freq: Once | INTRAMUSCULAR | Status: AC
  Administered 2016-12-26: 4 mg via INTRAVENOUS

## 2016-12-26 MED ORDER — DEXAMETHASONE SODIUM PHOSPHATE 4 MG/ML IJ SOLN
4.0000 mg | Freq: Once | INTRAMUSCULAR | Status: AC
  Administered 2016-12-26: 4 mg via INTRAVENOUS

## 2016-12-26 MED ORDER — ROCURONIUM 10MG/ML (10ML) SYRINGE FOR MEDFUSION PUMP - OPTIME
INTRAVENOUS | Status: DC | PRN
  Administered 2016-12-26: 5 mg via INTRAVENOUS
  Administered 2016-12-26: 35 mg via INTRAVENOUS
  Administered 2016-12-26: 5 mg via INTRAVENOUS

## 2016-12-26 MED ORDER — SUCCINYLCHOLINE CHLORIDE 20 MG/ML IJ SOLN
INTRAMUSCULAR | Status: AC
  Filled 2016-12-26: qty 1

## 2016-12-26 MED ORDER — 0.9 % SODIUM CHLORIDE (POUR BTL) OPTIME
TOPICAL | Status: DC | PRN
  Administered 2016-12-26: 1000 mL

## 2016-12-26 MED ORDER — FENTANYL CITRATE (PF) 250 MCG/5ML IJ SOLN
INTRAMUSCULAR | Status: AC
  Filled 2016-12-26: qty 5

## 2016-12-26 MED ORDER — METOPROLOL TARTRATE 25 MG PO TABS
25.0000 mg | ORAL_TABLET | Freq: Two times a day (BID) | ORAL | Status: DC
  Administered 2016-12-26 – 2016-12-27 (×2): 25 mg via ORAL
  Filled 2016-12-26 (×2): qty 1

## 2016-12-26 MED ORDER — LIDOCAINE HCL (PF) 1 % IJ SOLN
INTRAMUSCULAR | Status: AC
  Filled 2016-12-26: qty 5

## 2016-12-26 MED ORDER — SIMETHICONE 80 MG PO CHEW: 40.0000 mg | CHEWABLE_TABLET | Freq: Four times a day (QID) | ORAL | Status: DC | PRN

## 2016-12-26 MED ORDER — ONDANSETRON HCL 4 MG/2ML IJ SOLN
INTRAMUSCULAR | Status: AC
  Filled 2016-12-26: qty 2

## 2016-12-26 MED ORDER — PROPOFOL 10 MG/ML IV BOLUS
INTRAVENOUS | Status: DC | PRN
Start: 2016-12-26 — End: 2016-12-26
  Administered 2016-12-26: 180 mg via INTRAVENOUS

## 2016-12-26 MED ORDER — POVIDONE-IODINE 10 % OINT PACKET
TOPICAL_OINTMENT | CUTANEOUS | Status: DC | PRN
  Administered 2016-12-26: 1 via TOPICAL

## 2016-12-26 MED ORDER — DEXAMETHASONE SODIUM PHOSPHATE 4 MG/ML IJ SOLN
INTRAMUSCULAR | Status: AC
  Filled 2016-12-26: qty 1

## 2016-12-26 MED ORDER — OXYCODONE-ACETAMINOPHEN 5-325 MG PO TABS
1.0000 | ORAL_TABLET | ORAL | Status: DC | PRN
  Administered 2016-12-26: 2 via ORAL
  Filled 2016-12-26: qty 2

## 2016-12-26 MED ORDER — BUPIVACAINE HCL (PF) 0.5 % IJ SOLN
INTRAMUSCULAR | Status: DC | PRN
  Administered 2016-12-26: 10 mL

## 2016-12-26 MED ORDER — FENTANYL CITRATE (PF) 100 MCG/2ML IJ SOLN
INTRAMUSCULAR | Status: AC
  Filled 2016-12-26: qty 2

## 2016-12-26 MED ORDER — GLYCOPYRROLATE 0.2 MG/ML IJ SOLN
INTRAMUSCULAR | Status: DC | PRN
  Administered 2016-12-26: .6 mg via INTRAVENOUS

## 2016-12-26 MED ORDER — ROCURONIUM BROMIDE 50 MG/5ML IV SOLN
INTRAVENOUS | Status: AC
  Filled 2016-12-26: qty 1

## 2016-12-26 MED ORDER — GLYCOPYRROLATE 0.2 MG/ML IJ SOLN
INTRAMUSCULAR | Status: AC
  Filled 2016-12-26: qty 6

## 2016-12-26 MED ORDER — NEOSTIGMINE METHYLSULFATE 10 MG/10ML IV SOLN
INTRAVENOUS | Status: DC | PRN
  Administered 2016-12-26: 4 mg via INTRAVENOUS

## 2016-12-26 MED ORDER — SODIUM CHLORIDE 0.9 % IR SOLN
Status: DC | PRN
  Administered 2016-12-26: 3000 mL

## 2016-12-26 SURGICAL SUPPLY — 56 items
APL SRG 38 LTWT LNG FL B (MISCELLANEOUS) ×1
APPLICATOR ARISTA FLEXITIP XL (MISCELLANEOUS) ×2 IMPLANT
APPLIER CLIP ROT 10 11.4 M/L (STAPLE) ×3
APR CLP MED LRG 11.4X10 (STAPLE) ×1
BAG HAMPER (MISCELLANEOUS) ×3 IMPLANT
BAG RETRIEVAL 10 (BASKET) ×1
BAG RETRIEVAL 10MM (BASKET) ×1
CHLORAPREP W/TINT 26ML (MISCELLANEOUS) ×3 IMPLANT
CLIP APPLIE ROT 10 11.4 M/L (STAPLE) ×1 IMPLANT
CLOTH BEACON ORANGE TIMEOUT ST (SAFETY) ×3 IMPLANT
COVER LIGHT HANDLE STERIS (MISCELLANEOUS) ×6 IMPLANT
DECANTER SPIKE VIAL GLASS SM (MISCELLANEOUS) ×3 IMPLANT
ELECT REM PT RETURN 9FT ADLT (ELECTROSURGICAL) ×3
ELECTRODE REM PT RTRN 9FT ADLT (ELECTROSURGICAL) ×1 IMPLANT
FILTER SMOKE EVAC LAPAROSHD (FILTER) ×3 IMPLANT
FORMALIN 10 PREFIL 120ML (MISCELLANEOUS) ×3 IMPLANT
GLOVE BIOGEL PI IND STRL 6.5 (GLOVE) IMPLANT
GLOVE BIOGEL PI IND STRL 7.0 (GLOVE) ×3 IMPLANT
GLOVE BIOGEL PI INDICATOR 6.5 (GLOVE) ×2
GLOVE BIOGEL PI INDICATOR 7.0 (GLOVE) ×6
GLOVE SURG SS PI 6.5 STRL IVOR (GLOVE) ×2 IMPLANT
GLOVE SURG SS PI 7.0 STRL IVOR (GLOVE) ×2 IMPLANT
GLOVE SURG SS PI 7.5 STRL IVOR (GLOVE) ×3 IMPLANT
GOWN STRL REUS W/ TWL XL LVL3 (GOWN DISPOSABLE) ×1 IMPLANT
GOWN STRL REUS W/TWL LRG LVL3 (GOWN DISPOSABLE) ×6 IMPLANT
GOWN STRL REUS W/TWL XL LVL3 (GOWN DISPOSABLE) ×3
HEMOSTAT ARISTA ABSORB 3G PWDR (MISCELLANEOUS) ×2 IMPLANT
HEMOSTAT SNOW SURGICEL 2X4 (HEMOSTASIS) ×3 IMPLANT
INST SET LAPROSCOPIC AP (KITS) ×3 IMPLANT
IV NS IRRIG 3000ML ARTHROMATIC (IV SOLUTION) ×2 IMPLANT
KIT ROOM TURNOVER APOR (KITS) ×3 IMPLANT
MANIFOLD NEPTUNE II (INSTRUMENTS) ×3 IMPLANT
NDL INSUFFLATION 14GA 120MM (NEEDLE) ×1 IMPLANT
NEEDLE INSUFFLATION 14GA 120MM (NEEDLE) ×3 IMPLANT
NS IRRIG 1000ML POUR BTL (IV SOLUTION) ×3 IMPLANT
PACK LAP CHOLE LZT030E (CUSTOM PROCEDURE TRAY) ×3 IMPLANT
PAD ARMBOARD 7.5X6 YLW CONV (MISCELLANEOUS) ×3 IMPLANT
PENCIL HANDSWITCHING (ELECTRODE) ×3 IMPLANT
SET BASIN LINEN APH (SET/KITS/TRAYS/PACK) ×3 IMPLANT
SET TUBE IRRIG SUCTION NO TIP (IRRIGATION / IRRIGATOR) ×2 IMPLANT
SLEEVE ENDOPATH XCEL 5M (ENDOMECHANICALS) ×3 IMPLANT
SPONGE GAUZE 2X2 8PLY STER LF (GAUZE/BANDAGES/DRESSINGS) ×2
SPONGE GAUZE 2X2 8PLY STRL LF (GAUZE/BANDAGES/DRESSINGS) ×6 IMPLANT
STAPLER VISISTAT (STAPLE) ×3 IMPLANT
SUT VICRYL 0 UR6 27IN ABS (SUTURE) ×3 IMPLANT
SYS BAG RETRIEVAL 10MM (BASKET) ×1
SYSTEM BAG RETRIEVAL 10MM (BASKET) ×1 IMPLANT
TAPE SURG TRANSPORE 1 IN (GAUZE/BANDAGES/DRESSINGS) IMPLANT
TAPE SURGICAL TRANSPORE 1 IN (GAUZE/BANDAGES/DRESSINGS) ×2
TROCAR ENDO BLADELESS 11MM (ENDOMECHANICALS) ×3 IMPLANT
TROCAR XCEL NON-BLD 5MMX100MML (ENDOMECHANICALS) ×3 IMPLANT
TROCAR XCEL UNIV SLVE 11M 100M (ENDOMECHANICALS) ×3 IMPLANT
TUBE CONNECTING 12'X1/4 (SUCTIONS) ×1
TUBE CONNECTING 12X1/4 (SUCTIONS) ×2 IMPLANT
TUBING INSUFFLATION (TUBING) ×3 IMPLANT
WARMER LAPAROSCOPE (MISCELLANEOUS) ×3 IMPLANT

## 2016-12-26 NOTE — Progress Notes (Signed)
Subjective: He feels okay.  He is set for surgery  Objective: Vital signs in last 24 hours: Temp:  [98.6 F (37 C)-99.9 F (37.7 C)] 98.6 F (37 C) (11/28 0726) Pulse Rate:  [104-130] 115 (11/28 0726) Resp:  [17-40] 29 (11/28 0400) BP: (104-139)/(68-90) 132/85 (11/28 0726) SpO2:  [87 %-97 %] 94 % (11/28 0726) Weight change:  Last BM Date: 12/25/16  Intake/Output from previous day: 11/27 0701 - 11/28 0700 In: 2389.2 [P.O.:880; I.V.:1259.2; IV Piggyback:250] Out: -   PHYSICAL EXAM General appearance: alert and moderately obese Resp: clear to auscultation bilaterally Cardio: regular rate and rhythm, S1, S2 normal, no murmur, click, rub or gallop GI: soft, non-tender; bowel sounds normal; no masses,  no organomegaly Extremities: extremities normal, atraumatic, no cyanosis or edema Skin warm and dry  Lab Results:  Results for orders placed or performed during the hospital encounter of 12/23/16 (from the past 48 hour(s))  CBC with Differential/Platelet     Status: Abnormal   Collection Time: 12/25/16  5:59 AM  Result Value Ref Range   WBC 16.3 (H) 4.0 - 10.5 K/uL   RBC 3.53 (L) 4.22 - 5.81 MIL/uL   Hemoglobin 10.9 (L) 13.0 - 17.0 g/dL   HCT 32.7 (L) 39.0 - 52.0 %   MCV 92.6 78.0 - 100.0 fL   MCH 30.9 26.0 - 34.0 pg   MCHC 33.3 30.0 - 36.0 g/dL   RDW 14.6 11.5 - 15.5 %   Platelets 169 150 - 400 K/uL   Neutrophils Relative % 88 %   Neutro Abs 14.3 (H) 1.7 - 7.7 K/uL   Lymphocytes Relative 6 %   Lymphs Abs 1.0 0.7 - 4.0 K/uL   Monocytes Relative 6 %   Monocytes Absolute 1.0 0.1 - 1.0 K/uL   Eosinophils Relative 0 %   Eosinophils Absolute 0.1 0.0 - 0.7 K/uL   Basophils Relative 0 %   Basophils Absolute 0.0 0.0 - 0.1 K/uL  Basic metabolic panel     Status: Abnormal   Collection Time: 12/25/16  5:59 AM  Result Value Ref Range   Sodium 134 (L) 135 - 145 mmol/L   Potassium 3.3 (L) 3.5 - 5.1 mmol/L   Chloride 110 101 - 111 mmol/L   CO2 18 (L) 22 - 32 mmol/L   Glucose,  Bld 104 (H) 65 - 99 mg/dL   BUN 9 6 - 20 mg/dL   Creatinine, Ser 0.67 0.61 - 1.24 mg/dL   Calcium 8.7 (L) 8.9 - 10.3 mg/dL   GFR calc non Af Amer >60 >60 mL/min   GFR calc Af Amer >60 >60 mL/min    Comment: (NOTE) The eGFR has been calculated using the CKD EPI equation. This calculation has not been validated in all clinical situations. eGFR's persistently <60 mL/min signify possible Chronic Kidney Disease.    Anion gap 6 5 - 15  Magnesium     Status: None   Collection Time: 12/25/16  5:59 AM  Result Value Ref Range   Magnesium 1.7 1.7 - 2.4 mg/dL  CBC with Differential/Platelet     Status: Abnormal   Collection Time: 12/26/16  6:20 AM  Result Value Ref Range   WBC 14.0 (H) 4.0 - 10.5 K/uL   RBC 3.42 (L) 4.22 - 5.81 MIL/uL   Hemoglobin 10.7 (L) 13.0 - 17.0 g/dL   HCT 31.6 (L) 39.0 - 52.0 %   MCV 92.4 78.0 - 100.0 fL   MCH 31.3 26.0 - 34.0 pg   MCHC 33.9 30.0 -  36.0 g/dL   RDW 14.8 11.5 - 15.5 %   Platelets 221 150 - 400 K/uL   Neutrophils Relative % 84 %   Neutro Abs 11.8 (H) 1.7 - 7.7 K/uL   Lymphocytes Relative 7 %   Lymphs Abs 1.0 0.7 - 4.0 K/uL   Monocytes Relative 8 %   Monocytes Absolute 1.1 (H) 0.1 - 1.0 K/uL   Eosinophils Relative 1 %   Eosinophils Absolute 0.2 0.0 - 0.7 K/uL   Basophils Relative 0 %   Basophils Absolute 0.0 0.0 - 0.1 K/uL  Comprehensive metabolic panel     Status: Abnormal   Collection Time: 12/26/16  6:20 AM  Result Value Ref Range   Sodium 134 (L) 135 - 145 mmol/L   Potassium 3.4 (L) 3.5 - 5.1 mmol/L   Chloride 107 101 - 111 mmol/L   CO2 19 (L) 22 - 32 mmol/L   Glucose, Bld 99 65 - 99 mg/dL   BUN 7 6 - 20 mg/dL   Creatinine, Ser 0.64 0.61 - 1.24 mg/dL   Calcium 8.7 (L) 8.9 - 10.3 mg/dL   Total Protein 6.7 6.5 - 8.1 g/dL   Albumin 2.9 (L) 3.5 - 5.0 g/dL   AST 19 15 - 41 U/L   ALT 25 17 - 63 U/L   Alkaline Phosphatase 67 38 - 126 U/L   Total Bilirubin 0.6 0.3 - 1.2 mg/dL   GFR calc non Af Amer >60 >60 mL/min   GFR calc Af Amer >60 >60  mL/min    Comment: (NOTE) The eGFR has been calculated using the CKD EPI equation. This calculation has not been validated in all clinical situations. eGFR's persistently <60 mL/min signify possible Chronic Kidney Disease.    Anion gap 8 5 - 15    ABGS No results for input(s): PHART, PO2ART, TCO2, HCO3 in the last 72 hours.  Invalid input(s): PCO2 CULTURES Recent Results (from the past 240 hour(s))  Culture, blood (Routine X 2) w Reflex to ID Panel     Status: None (Preliminary result)   Collection Time: 12/23/16 11:46 AM  Result Value Ref Range Status   Specimen Description BLOOD LEFT ARM  Final   Special Requests   Final    BOTTLES DRAWN AEROBIC AND ANAEROBIC Blood Culture adequate volume   Culture NO GROWTH 3 DAYS  Final   Report Status PENDING  Incomplete  Culture, blood (Routine X 2) w Reflex to ID Panel     Status: None (Preliminary result)   Collection Time: 12/23/16 11:52 AM  Result Value Ref Range Status   Specimen Description BLOOD LEFT ARM  Final   Special Requests   Final    BOTTLES DRAWN AEROBIC AND ANAEROBIC Blood Culture adequate volume   Culture NO GROWTH 3 DAYS  Final   Report Status PENDING  Incomplete  MRSA PCR Screening     Status: None   Collection Time: 12/23/16  2:50 PM  Result Value Ref Range Status   MRSA by PCR NEGATIVE NEGATIVE Final    Comment:        The GeneXpert MRSA Assay (FDA approved for NASAL specimens only), is one component of a comprehensive MRSA colonization surveillance program. It is not intended to diagnose MRSA infection nor to guide or monitor treatment for MRSA infections.    Studies/Results: No results found.  Medications:  Prior to Admission:  Medications Prior to Admission  Medication Sig Dispense Refill Last Dose  . ABILIFY 10 MG tablet Take 5 mg by  mouth 2 (two) times daily.    12/25/2016 at 2000  . amantadine (SYMMETREL) 100 MG capsule Take 100 mg by mouth 2 (two) times daily.    12/25/2016 at 2000  .  clonazePAM (KLONOPIN) 0.5 MG tablet Take 0.5 mg by mouth 2 (two) times daily.   12/25/2016 at 2000  . dicyclomine (BENTYL) 10 MG capsule TAKE 1 CAPSULE BY MOUTH 4 TIMES DAILY WITH MEALS AND AT BEDTIME AS NEEDED. HOLD FOR CONSTIPATION. 120 capsule 11 12/25/2016 at 2000  . lisinopril (PRINIVIL,ZESTRIL) 10 MG tablet Take 10 mg by mouth daily.   12/25/2016 at 2000  . pantoprazole (PROTONIX) 40 MG tablet TAKE 1 TABLET BY MOUTH ONCE DAILY BEFORE BREAKFAST. 90 tablet 2 12/25/2016 at 2000  . ZOLOFT 100 MG tablet Take 150 mg by mouth daily.    12/25/2016 at 1000  . Cetirizine HCl (ZYRTEC ALLERGY) 10 MG CAPS Take 1 capsule (10 mg total) by mouth daily as needed (for itching). (Patient not taking: Reported on 12/23/2016) 14 capsule 0 Not Taking at Unknown time   Scheduled: . [MAR Hold] amantadine  100 mg Oral BID  . [MAR Hold] ARIPiprazole  5 mg Oral BID  . [MAR Hold] clonazePAM  0.5 mg Oral BID  . dexamethasone  4 mg Intravenous Once  . [MAR Hold] enoxaparin (LOVENOX) injection  40 mg Subcutaneous Q24H  . [MAR Hold] metoprolol tartrate  12.5 mg Oral BID  . midazolam  1-2 mg Intravenous Q5 min  . ondansetron (ZOFRAN) IV  4 mg Intravenous Once  . [MAR Hold] pantoprazole  40 mg Oral Daily  . [MAR Hold] potassium chloride  20 mEq Oral BID  . [MAR Hold] sertraline  150 mg Oral Daily   Continuous: . 0.9 % NaCl with KCl 20 mEq / L 75 mL/hr at 12/25/16 1756  . lactated ringers    . [MAR Hold] piperacillin-tazobactam Stopped (12/26/16 0327)   PRN:[MAR Hold] acetaminophen **OR** [MAR Hold] acetaminophen, [MAR Hold] fentaNYL (SUBLIMAZE) injection, [MAR Hold] ondansetron **OR** [MAR Hold] ondansetron (ZOFRAN) IV  Assesment: He was admitted with acute cholecystitis and he was septic from that.  He is much better.  He has had some issues with tachycardia and that has improved with metoprolol.  He has hypertension at baseline.  He has bipolar disease at baseline which is well controlled. Active Problems:    Hypertension   Sepsis due to undetermined organism ([place])   Calculus of gallbladder with acute cholecystitis without obstruction   Hyponatremia   Hypokalemia    Plan: For cholecystectomy today    LOS: 3 days   Bobby Jimenez L 12/26/2016, 8:11 AM

## 2016-12-26 NOTE — Anesthesia Postprocedure Evaluation (Signed)
Anesthesia Post Note  Patient: Bobby Jimenez  Procedure(s) Performed: LAPAROSCOPIC CHOLECYSTECTOMY (N/A Abdomen)  Patient location during evaluation: PACU Anesthesia Type: General Level of consciousness: awake and alert and oriented Pain management: pain level controlled Vital Signs Assessment: post-procedure vital signs reviewed and stable Respiratory status: spontaneous breathing and patient connected to face mask oxygen Cardiovascular status: stable Postop Assessment: no apparent nausea or vomiting Anesthetic complications: no     Last Vitals:  Vitals:   12/26/16 1115 12/26/16 1127  BP: (!) 149/88 (!) 145/94  Pulse: (!) 119 (!) 117  Resp: (!) 28 (!) 26  Temp:  37.2 C  SpO2: 94% 93%    Last Pain:  Vitals:   12/26/16 1115  TempSrc:   PainSc: 2                  Murriel Holwerda

## 2016-12-26 NOTE — Anesthesia Procedure Notes (Signed)
Procedure Name: Intubation Date/Time: 12/26/2016 8:52 AM Performed by: Ollen Bowl, CRNA Pre-anesthesia Checklist: Patient identified, Patient being monitored, Timeout performed, Emergency Drugs available and Suction available Patient Re-evaluated:Patient Re-evaluated prior to induction Oxygen Delivery Method: Circle system utilized Preoxygenation: Pre-oxygenation with 100% oxygen Induction Type: IV induction, Rapid sequence and Cricoid Pressure applied Ventilation: Mask ventilation without difficulty Laryngoscope Size: Mac and 3 Grade View: Grade II Tube type: Oral Tube size: 7.0 mm Number of attempts: 1 Airway Equipment and Method: Stylet Placement Confirmation: ETT inserted through vocal cords under direct vision,  positive ETCO2 and breath sounds checked- equal and bilateral Secured at: 21 cm Tube secured with: Tape Dental Injury: Teeth and Oropharynx as per pre-operative assessment

## 2016-12-26 NOTE — Op Note (Signed)
Patient:  Bobby Jimenez  DOB:  05/23/1980  MRN:  [id number]   Preop Diagnosis: Cholecystitis, cholelithiasis  Postop Diagnosis: Same  Procedure: Laparoscopic cholecystectomy  Surgeon: Franky MachoMark Addasyn Mcbreen, MD  Assistant: Larae GroomsLindsey Bridges, MD  Anes: General endotracheal  Indications: Patient is a 36 year old white male who presented to the hospital with right upper quadrant pain.  Workup revealed acute cholecystitis with cholelithiasis.  He has been in the ICU for control of his tachycardia and fluid resuscitation.  The risks and benefits of the procedure including bleeding, infection, hepatobiliary injury, and the possibility of an open procedure were fully explained to the patient, who gave informed consent.  Procedure note: The patient was placed in supine position.  After induction of general endotracheal anesthesia, the abdomen was prepped and draped using the usual sterile technique with DuraPrep.  Surgical site confirmation was performed.  A supraumbilical incision was made down to the fascia.  A Veress needle was introduced into the abdominal cavity and confirmation of placement was done using the saline drop test.  The abdomen was then insufflated to 16 mmHg pressure.  An 11 mm trocar was introduced into the abdominal cavity under direct visualization without difficulty.  Patient was placed in reverse Trendelenburg position and an additional 11 mm trocar was placed in the epigastric region and 5 mm trochars were placed in the right upper quadrant and right flank regions.  The liver was inspected and noted to be within normal limits.  The gallbladder was noted to be tensely distended with erythematous, inflamed, thickened gallbladder wall.  The gallbladder was decompressed at the fundus and hydrops as well as an empyema were found.  The gallbladder lumen was then evacuated using suction.  The gallbladder was retracted in a dynamic weight in order to provide a critical view of the triangle of  Calot.  There was a significant amount of omentum and inflammatory response around the gallbladder.  The cystic duct was identified.  Its juncture to the infundibulum was fully identified.  Endoclips were placed proximally and distally on the cystic duct, and the cystic duct was divided.  The cystic artery was identified and ligated.  The gallbladder was then freed away from the gallbladder fossa using Bovie electrocautery and blunt dissection.  The gallbladder was delivered ultimately through the epigastric trocar site using an Endo Catch bag.  The gallbladder fossa was inspected and any bleeding was controlled using Bovie electrocautery.  Arista and Surgicel were placed in the gallbladder fossa.  The right upper quadrant was copiously irrigated with normal saline.  All fluid and air were then evacuated from the abdominal cavity prior to removal of the trochars.  All wounds were irrigated with normal saline.  All wounds were injected with 0.5% Sensorcaine.  The supraumbilical fascia as well as epigastric fascia were reapproximated using 0 Vicryl interrupted sutures.  All skin incisions were closed using staples.  Betadine ointment and dry sterile dressings were applied.  All tape and needle counts were correct at the end of the procedure.  Patient was extubated in the operating room and transferred to PACU in stable condition.  Complications: None  EBL: Minimal  Specimen: Gallbladder

## 2016-12-26 NOTE — Interval H&P Note (Signed)
History and Physical Interval Note:  12/26/2016 8:07 AM  Bobby Jimenez  has presented today for surgery, with the diagnosis of acute cholecystitis, cholelithiasis  The various methods of treatment have been discussed with the patient and family. After consideration of risks, benefits and other options for treatment, the patient has consented to  Procedure(s): LAPAROSCOPIC CHOLECYSTECTOMY (N/A) as a surgical intervention .  The patient's history has been reviewed, patient examined, no change in status, stable for surgery.  I have reviewed the patient's chart and labs.  Questions were answered to the patient's satisfaction.     Franky MachoMark Esme Durkin

## 2016-12-26 NOTE — Anesthesia Preprocedure Evaluation (Signed)
Anesthesia Evaluation  Patient identified by MRN, date of birth, ID band Patient awake    Reviewed: Allergy & Precautions, NPO status , Patient's Chart, lab work & pertinent test results  Airway Mallampati: II  TM Distance: >3 FB Neck ROM: Full    Dental  (+) Teeth Intact   Pulmonary Current Smoker,    breath sounds clear to auscultation       Cardiovascular hypertension, Pt. on medications  Rhythm:Regular Rate:Normal     Neuro/Psych  Headaches, PSYCHIATRIC DISORDERS Anxiety Depression Bipolar Disorder  Neuromuscular disease ( Cervical radiculopathy)    GI/Hepatic GERD  ,  Endo/Other    Renal/GU      Musculoskeletal   Abdominal   Peds  Hematology   Anesthesia Other Findings   Reproductive/Obstetrics                             Anesthesia Physical Anesthesia Plan  ASA: II  Anesthesia Plan: General   Post-op Pain Management:    Induction: Intravenous, Rapid sequence and Cricoid pressure planned  PONV Risk Score and Plan:   Airway Management Planned: Oral ETT  Additional Equipment:   Intra-op Plan:   Post-operative Plan: Extubation in OR  Informed Consent: I have reviewed the patients History and Physical, chart, labs and discussed the procedure including the risks, benefits and alternatives for the proposed anesthesia with the patient or authorized representative who has indicated his/her understanding and acceptance.     Plan Discussed with:   Anesthesia Plan Comments:         Anesthesia Quick Evaluation

## 2016-12-26 NOTE — Transfer of Care (Signed)
Immediate Anesthesia Transfer of Care Note  Patient: Bobby Jimenez  Procedure(s) Performed: LAPAROSCOPIC CHOLECYSTECTOMY (N/A Abdomen)  Patient Location: PACU  Anesthesia Type:General  Level of Consciousness: awake and alert   Airway & Oxygen Therapy: Patient Spontanous Breathing and Patient connected to face mask oxygen  Post-op Assessment: Report given to RN  Post vital signs: Reviewed and stable  Last Vitals:  Vitals:   12/26/16 0400 12/26/16 0726  BP: 129/85 132/85  Pulse: (!) 105 (!) 115  Resp: (!) 29   Temp:  37 C  SpO2: (!) 89% 94%    Last Pain:  Vitals:   12/26/16 0726  TempSrc: Oral  PainSc:       Patients Stated Pain Goal: 3 (12/25/16 1733)  Complications: No apparent anesthesia complications

## 2016-12-27 ENCOUNTER — Encounter (HOSPITAL_COMMUNITY): Payer: Self-pay | Admitting: General Surgery

## 2016-12-27 DIAGNOSIS — R000 Tachycardia, unspecified: Secondary | ICD-10-CM | POA: Diagnosis present

## 2016-12-27 DIAGNOSIS — J9811 Atelectasis: Secondary | ICD-10-CM | POA: Diagnosis not present

## 2016-12-27 LAB — COMPREHENSIVE METABOLIC PANEL
ALK PHOS: 73 U/L (ref 38–126)
ALT: 38 U/L (ref 17–63)
ANION GAP: 8 (ref 5–15)
AST: 31 U/L (ref 15–41)
Albumin: 2.7 g/dL — ABNORMAL LOW (ref 3.5–5.0)
BILIRUBIN TOTAL: 0.5 mg/dL (ref 0.3–1.2)
BUN: 12 mg/dL (ref 6–20)
CALCIUM: 8.8 mg/dL — AB (ref 8.9–10.3)
CO2: 22 mmol/L (ref 22–32)
Chloride: 109 mmol/L (ref 101–111)
Creatinine, Ser: 0.73 mg/dL (ref 0.61–1.24)
GFR calc non Af Amer: >60 mL/min (ref >60)
Glucose, Bld: 112 mg/dL — ABNORMAL HIGH (ref 65–99)
Potassium: 3.5 mmol/L (ref 3.5–5.1)
Sodium: 139 mmol/L (ref 135–145)
TOTAL PROTEIN: 6.4 g/dL — AB (ref 6.5–8.1)

## 2016-12-27 LAB — CBC WITH DIFFERENTIAL/PLATELET
Basophils Absolute: 0 10*3/uL (ref 0.0–0.1)
Basophils Relative: 0 %
EOS ABS: 0.1 10*3/uL (ref 0.0–0.7)
Eosinophils Relative: 1 %
HEMATOCRIT: 29.4 % — AB (ref 39.0–52.0)
HEMOGLOBIN: 9.8 g/dL — AB (ref 13.0–17.0)
LYMPHS ABS: 1.5 10*3/uL (ref 0.7–4.0)
Lymphocytes Relative: 12 %
MCH: 31.2 pg (ref 26.0–34.0)
MCHC: 33.3 g/dL (ref 30.0–36.0)
MCV: 93.6 fL (ref 78.0–100.0)
MONOS PCT: 8 %
Monocytes Absolute: 0.9 10*3/uL (ref 0.1–1.0)
NEUTROS ABS: 9.8 10*3/uL — AB (ref 1.7–7.7)
NEUTROS PCT: 79 %
Platelets: 249 10*3/uL (ref 150–400)
RBC: 3.14 MIL/uL — ABNORMAL LOW (ref 4.22–5.81)
RDW: 15.1 % (ref 11.5–15.5)
WBC: 12.4 10*3/uL — ABNORMAL HIGH (ref 4.0–10.5)

## 2016-12-27 MED ORDER — CHLORHEXIDINE GLUCONATE CLOTH 2 % EX PADS: 6.0000 | MEDICATED_PAD | Freq: Every day | CUTANEOUS | Status: DC

## 2016-12-27 MED ORDER — CIPROFLOXACIN HCL 500 MG PO TABS: 500.0000 mg | ORAL_TABLET | Freq: Two times a day (BID) | ORAL | 0 refills | Status: AC

## 2016-12-27 MED ORDER — SODIUM CHLORIDE 0.9% FLUSH: 10.0000 mL | Freq: Two times a day (BID) | INTRAVENOUS | Status: DC

## 2016-12-27 MED ORDER — SODIUM CHLORIDE 0.9% FLUSH: 10.0000 mL | INTRAVENOUS | Status: DC | PRN

## 2016-12-27 MED ORDER — OXYCODONE-ACETAMINOPHEN 5-325 MG PO TABS: 1.0000 | ORAL_TABLET | ORAL | 0 refills | Status: DC | PRN

## 2016-12-27 MED ORDER — METOPROLOL TARTRATE 25 MG PO TABS: 25.0000 mg | ORAL_TABLET | Freq: Two times a day (BID) | ORAL | 1 refills | Status: DC

## 2016-12-27 NOTE — Progress Notes (Signed)
1 Day Post-Op  Subjective: Patient has minimal abdominal pain this morning.  Has been on regular diet.  Objective: Vital signs in last 24 hours: Temp:  [98.2 F (36.8 C)-99.9 F (37.7 C)] 98.2 F (36.8 C) (11/29 0758) Pulse Rate:  [90-131] 121 (11/29 0758) Resp:  [16-44] 16 (11/29 0758) BP: (94-158)/(63-96) 144/90 (11/29 0700) SpO2:  [86 %-95 %] 89 % (11/29 0758) Weight:  [249 lb 5.4 oz (113.1 kg)] 249 lb 5.4 oz (113.1 kg) (11/29 0500) Last BM Date: 12/25/16  Intake/Output from previous day: 11/28 0701 - 11/29 0700 In: 3955 [P.O.:250; I.V.:3555; IV Piggyback:150] Out: 15 [Blood:15] Intake/Output this shift: Total I/O In: 320 [P.O.:320] Out: -   General appearance: alert, cooperative and no distress GI: Soft, incisions healing well.  Lab Results:  Recent Labs    12/26/16 0620 12/27/16 0418  WBC 14.0* 12.4*  HGB 10.7* 9.8*  HCT 31.6* 29.4*  PLT 221 249   BMET Recent Labs    12/26/16 0620 12/27/16 0418  NA 134* 139  K 3.4* 3.5  CL 107 109  CO2 19* 22  GLUCOSE 99 112*  BUN 7 12  CREATININE 0.64 0.73  CALCIUM 8.7* 8.8*   PT/INR No results for input(s): LABPROT, INR in the last 72 hours.  Studies/Results: Dg Chest Port 1 View  Result Date: 12/26/2016 CLINICAL DATA:  Oxygen desaturation question pneumonia, history hypertension EXAM: PORTABLE CHEST 1 VIEW COMPARISON:  Portable exam 1518 hours compared to 12/23/2016 FINDINGS: Upper normal size of cardiac silhouette. Mediastinal contours and pulmonary vascularity normal. Decreased lung volumes with bibasilar opacities question atelectasis versus infiltrate. Upper lungs clear. No gross pleural effusion or pneumothorax. IMPRESSION: Low lung volumes with mild bibasilar opacities question atelectasis versus infiltrate. Electronically Signed   By: Ulyses SouthwardMark  Boles M.D.   On: 12/26/2016 15:34    Anti-infectives: Anti-infectives (From admission, onward)   Start     Dose/Rate Route Frequency Ordered Stop   12/27/16 0000   ciprofloxacin (CIPRO) 500 MG tablet     500 mg Oral 2 times daily 12/27/16 0802 01/03/17 2359   12/24/16 0000  piperacillin-tazobactam (ZOSYN) IVPB 3.375 g     3.375 g 12.5 mL/hr over 240 Minutes Intravenous Every 8 hours 12/23/16 1633     12/23/16 1500  piperacillin-tazobactam (ZOSYN) IVPB 3.375 g     3.375 g 100 mL/hr over 30 Minutes Intravenous  Once 12/23/16 1454 12/23/16 1730   12/23/16 1115  piperacillin-tazobactam (ZOSYN) IVPB 3.375 g     3.375 g 100 mL/hr over 30 Minutes Intravenous  Once 12/23/16 1112 12/23/16 1155      Assessment/Plan: s/p Procedure(s): LAPAROSCOPIC CHOLECYSTECTOMY Impression: Stable on postoperative day 1.  Okay for discharge from surgery standpoint.  Will need 1 week of ciprofloxacin.  Will follow-up in my office next week.  LOS: 4 days    Franky MachoMark Moriah Shawley 12/27/2016

## 2016-12-27 NOTE — Discharge Summary (Signed)
Physician Discharge Summary  Patient ID: Bobby Jimenez MRN: [id number] DOB/AGE: [birth year] 36 y.o. Primary Care Physician:Kacey Dysert, Ramon DredgeEdward, MD Admit date: 12/23/2016 Discharge date: 12/27/2016    Discharge Diagnoses:   Active Problems:   Hypertension   Sepsis due to undetermined organism (HCC)   Calculus of gallbladder with acute cholecystitis without obstruction   Hyponatremia   Hypokalemia   Empyema of gallbladder   Sinus tachycardia   Atelectasis   Allergies as of 12/27/2016      Reactions   Pineapple Shortness Of Breath, Swelling   Latex Swelling   Morphine And Related Hives, Itching      Medication List    TAKE these medications   ABILIFY 10 MG tablet Generic drug:  ARIPiprazole Take 5 mg by mouth 2 (two) times daily.   amantadine 100 MG capsule Commonly known as:  SYMMETREL Take 100 mg by mouth 2 (two) times daily.   Cetirizine HCl 10 MG Caps Commonly known as:  ZYRTEC ALLERGY Take 1 capsule (10 mg total) by mouth daily as needed (for itching).   ciprofloxacin 500 MG tablet Commonly known as:  CIPRO Take 1 tablet (500 mg total) by mouth 2 (two) times daily for 7 days.   clonazePAM 0.5 MG tablet Commonly known as:  KLONOPIN Take 0.5 mg by mouth 2 (two) times daily.   dicyclomine 10 MG capsule Commonly known as:  BENTYL TAKE 1 CAPSULE BY MOUTH 4 TIMES DAILY WITH MEALS AND AT BEDTIME AS NEEDED. HOLD FOR CONSTIPATION.   lisinopril 10 MG tablet Commonly known as:  PRINIVIL,ZESTRIL Take 10 mg by mouth daily.   metoprolol tartrate 25 MG tablet Commonly known as:  LOPRESSOR Take 1 tablet (25 mg total) by mouth 2 (two) times daily.   pantoprazole 40 MG tablet Commonly known as:  PROTONIX TAKE 1 TABLET BY MOUTH ONCE DAILY BEFORE BREAKFAST.   ZOLOFT 100 MG tablet Generic drug:  sertraline Take 150 mg by mouth daily.       Discharged Condition: Improved    Consults: General surgery, Dr. Lovell SheehanJenkins  Significant Diagnostic Studies: Ct  Abdomen Pelvis W Contrast  Result Date: 12/23/2016 CLINICAL DATA:  Abdominal pain radiating into the low back since yesterday. EXAM: CT ABDOMEN AND PELVIS WITH CONTRAST TECHNIQUE: Multidetector CT imaging of the abdomen and pelvis was performed using the standard protocol following bolus administration of intravenous contrast. CONTRAST:  100 ml ISOVUE-300 IOPAMIDOL (ISOVUE-300) INJECTION 61% COMPARISON:  CT abdomen and pelvis 09/05/2014. FINDINGS: Lower chest: Heart size is normal. There is some dependent atelectasis. No pleural or pericardial effusion. Hepatobiliary: Multiple small stones are seen within the gallbladder. There is extensive stranding about the gallbladder and the gallbladder wall is thickened. Mild fatty infiltration of the liver without focal lesion is seen. The biliary tree is unremarkable. Pancreas: Unremarkable. No pancreatic ductal dilatation or surrounding inflammatory changes. Spleen: Normal in size without focal abnormality. Adrenals/Urinary Tract: Adrenal glands are unremarkable. Kidneys are normal, without renal calculi, focal lesion, or hydronephrosis. Bladder is unremarkable. Stomach/Bowel: Stomach is within normal limits. Appendix appears normal. No evidence of small bowel obstruction. There is stranding about the proximal duodenum. The colon and appendix appear normal. Vascular/Lymphatic: No significant vascular findings are present. No enlarged abdominal or pelvic lymph nodes. Reproductive: Prostate is unremarkable. Other: Trace amount of free pelvic fluid is identified. Tiny fat containing umbilical hernia is seen. Musculoskeletal: Negative. IMPRESSION: Findings most consistent with acute cholecystitis with multiple small stones identified in the gallbladder. Stranding about the proximal duodenum is likely secondary to  cholecystitis rather than duodenitis. Right upper quadrant ultrasound could be used for further evaluation. Mild fatty infiltration of the liver. Electronically  Signed   By: Drusilla Kannerhomas  Dalessio M.D.   On: 12/23/2016 11:08   Dg Chest Port 1 View  Result Date: 12/26/2016 CLINICAL DATA:  Oxygen desaturation question pneumonia, history hypertension EXAM: PORTABLE CHEST 1 VIEW COMPARISON:  Portable exam 1518 hours compared to 12/23/2016 FINDINGS: Upper normal size of cardiac silhouette. Mediastinal contours and pulmonary vascularity normal. Decreased lung volumes with bibasilar opacities question atelectasis versus infiltrate. Upper lungs clear. No gross pleural effusion or pneumothorax. IMPRESSION: Low lung volumes with mild bibasilar opacities question atelectasis versus infiltrate. Electronically Signed   By: Ulyses SouthwardMark  Boles M.D.   On: 12/26/2016 15:34   Dg Chest Port 1 View  Result Date: 12/23/2016 CLINICAL DATA:  36 year old male with sepsis. EXAM: PORTABLE CHEST 1 VIEW COMPARISON:  12/22/2013 FINDINGS: There are low lung volumes with bronchovascular crowding. Cardiomediastinal silhouette appears slightly enlarged. Right basilar linear opacities most suggestive of atelectasis but early developing infiltrate not excluded. No sizable pleural effusions. No pneumothorax. No acute osseous abnormalities. IMPRESSION: Low lung volumes with bronchovascular crowding. Linear right basilar opacities most suggestive of atelectasis, early developing infiltrate not excluded. Electronically Signed   By: Sande BrothersSerena  Chacko M.D.   On: 12/23/2016 15:29    Lab Results: Basic Metabolic Panel: Recent Labs    12/25/16 0559 12/26/16 0620 12/27/16 0418  NA 134* 134* 139  K 3.3* 3.4* 3.5  CL 110 107 109  CO2 18* 19* 22  GLUCOSE 104* 99 112*  BUN 9 7 12   CREATININE 0.67 0.64 0.73  CALCIUM 8.7* 8.7* 8.8*  MG 1.7  --   --    Liver Function Tests: Recent Labs    12/26/16 0620 12/27/16 0418  AST 19 31  ALT 25 38  ALKPHOS 67 73  BILITOT 0.6 0.5  PROT 6.7 6.4*  ALBUMIN 2.9* 2.7*     CBC: Recent Labs    12/26/16 0620 12/27/16 0418  WBC 14.0* 12.4*  NEUTROABS 11.8* 9.8*   HGB 10.7* 9.8*  HCT 31.6* 29.4*  MCV 92.4 93.6  PLT 221 249    Recent Results (from the past 240 hour(s))  Culture, blood (Routine X 2) w Reflex to ID Panel     Status: None (Preliminary result)   Collection Time: 12/23/16 11:46 AM  Result Value Ref Range Status   Specimen Description BLOOD LEFT ARM  Final   Special Requests   Final    BOTTLES DRAWN AEROBIC AND ANAEROBIC Blood Culture adequate volume   Culture NO GROWTH 3 DAYS  Final   Report Status PENDING  Incomplete  Culture, blood (Routine X 2) w Reflex to ID Panel     Status: None (Preliminary result)   Collection Time: 12/23/16 11:52 AM  Result Value Ref Range Status   Specimen Description BLOOD LEFT ARM  Final   Special Requests   Final    BOTTLES DRAWN AEROBIC AND ANAEROBIC Blood Culture adequate volume   Culture NO GROWTH 3 DAYS  Final   Report Status PENDING  Incomplete  MRSA PCR Screening     Status: None   Collection Time: 12/23/16  2:50 PM  Result Value Ref Range Status   MRSA by PCR NEGATIVE NEGATIVE Final    Comment:        The GeneXpert MRSA Assay (FDA approved for NASAL specimens only), is one component of a comprehensive MRSA colonization surveillance program. It is not intended to  diagnose MRSA infection nor to guide or monitor treatment for MRSA infections.      Hospital Course: This is a 36 year old who came to the emergency department because of abdominal pain.  This got worse over the next 24 hours and he came to the emergency department.  He was found there to have cholecystitis and appeared to be septic.  His white blood count was 28,000.  He was given saline head CT that showed cholelithiasis with gallbladder stranding and gallbladder wall thickening.  He was started on Zosyn.  He had sinus tachycardia even after the sepsis seem to have resolved and he had previously been on a beta-blocker so I had restarted that while he was in the hospital.  His white blood count improved his clinical exam  improved.  He did have some atelectasis of the right greater than left lung.  He had surgery on the 28th and did have some empyema of the gallbladder but his white blood cell count continued to come down and was 12,400 at the time of discharge his breathing was better he did not complain of pain he was able to eat solid food and he wanted to go home.  I discussed his situation with Dr. Lovell Sheehan who agreed that he could be discharged safely.  Discharge Exam: Blood pressure (!) 144/90, pulse (!) 121, temperature 98.2 F (36.8 C), temperature source Oral, resp. rate 16, height 5\' 10"  (1.778 m), weight 113.1 kg (249 lb 5.4 oz), SpO2 (!) 89 %. He is awake and alert.  Chest is clear.  Heart is regular.  Minimal abdominal tenderness.  Disposition: Home      Signed: Harrison Paulson L   12/27/2016, 8:04 AM

## 2016-12-27 NOTE — Progress Notes (Signed)
Subjective: He says he feels better.  He is got a little bit of incisional pain.  He is not having any nausea.  He has not had any fever.  His white blood count is down to 12,400.  Heart rate is better.  Objective: Vital signs in last 24 hours: Temp:  [98.4 F (36.9 C)-99.9 F (37.7 C)] 98.5 F (36.9 C) (11/29 0400) Pulse Rate:  [90-131] 107 (11/29 0700) Resp:  [20-44] 30 (11/29 0700) BP: (94-158)/(63-96) 144/90 (11/29 0700) SpO2:  [86 %-96 %] 86 % (11/29 0700) Weight:  [113.1 kg (249 lb 5.4 oz)] 113.1 kg (249 lb 5.4 oz) (11/29 0500) Weight change:  Last BM Date: 12/25/16  Intake/Output from previous day: 11/28 0701 - 11/29 0700 In: 3955 [P.O.:250; I.V.:3555; IV Piggyback:150] Out: 15 [Blood:15]  PHYSICAL EXAM General appearance: alert, cooperative and no distress Resp: rhonchi bilaterally Cardio: regular rate and rhythm, S1, S2 normal, no murmur, click, rub or gallop GI: soft, non-tender; bowel sounds normal; no masses,  no organomegaly Extremities: extremities normal, atraumatic, no cyanosis or edema Skin warm and dry  Lab Results:  Results for orders placed or performed during the hospital encounter of 12/23/16 (from the past 48 hour(s))  CBC with Differential/Platelet     Status: Abnormal   Collection Time: 12/26/16  6:20 AM  Result Value Ref Range   WBC 14.0 (H) 4.0 - 10.5 K/uL   RBC 3.42 (L) 4.22 - 5.81 MIL/uL   Hemoglobin 10.7 (L) 13.0 - 17.0 g/dL   HCT 31.6 (L) 39.0 - 52.0 %   MCV 92.4 78.0 - 100.0 fL   MCH 31.3 26.0 - 34.0 pg   MCHC 33.9 30.0 - 36.0 g/dL   RDW 14.8 11.5 - 15.5 %   Platelets 221 150 - 400 K/uL   Neutrophils Relative % 84 %   Neutro Abs 11.8 (H) 1.7 - 7.7 K/uL   Lymphocytes Relative 7 %   Lymphs Abs 1.0 0.7 - 4.0 K/uL   Monocytes Relative 8 %   Monocytes Absolute 1.1 (H) 0.1 - 1.0 K/uL   Eosinophils Relative 1 %   Eosinophils Absolute 0.2 0.0 - 0.7 K/uL   Basophils Relative 0 %   Basophils Absolute 0.0 0.0 - 0.1 K/uL  Comprehensive  metabolic panel     Status: Abnormal   Collection Time: 12/26/16  6:20 AM  Result Value Ref Range   Sodium 134 (L) 135 - 145 mmol/L   Potassium 3.4 (L) 3.5 - 5.1 mmol/L   Chloride 107 101 - 111 mmol/L   CO2 19 (L) 22 - 32 mmol/L   Glucose, Bld 99 65 - 99 mg/dL   BUN 7 6 - 20 mg/dL   Creatinine, Ser 0.64 0.61 - 1.24 mg/dL   Calcium 8.7 (L) 8.9 - 10.3 mg/dL   Total Protein 6.7 6.5 - 8.1 g/dL   Albumin 2.9 (L) 3.5 - 5.0 g/dL   AST 19 15 - 41 U/L   ALT 25 17 - 63 U/L   Alkaline Phosphatase 67 38 - 126 U/L   Total Bilirubin 0.6 0.3 - 1.2 mg/dL   GFR calc non Af Amer >60 >60 mL/min   GFR calc Af Amer >60 >60 mL/min    Comment: (NOTE) The eGFR has been calculated using the CKD EPI equation. This calculation has not been validated in all clinical situations. eGFR's persistently <60 mL/min signify possible Chronic Kidney Disease.    Anion gap 8 5 - 15  CBC with Differential/Platelet  Status: Abnormal   Collection Time: 12/27/16  4:18 AM  Result Value Ref Range   WBC 12.4 (H) 4.0 - 10.5 K/uL   RBC 3.14 (L) 4.22 - 5.81 MIL/uL   Hemoglobin 9.8 (L) 13.0 - 17.0 g/dL   HCT 29.4 (L) 39.0 - 52.0 %   MCV 93.6 78.0 - 100.0 fL   MCH 31.2 26.0 - 34.0 pg   MCHC 33.3 30.0 - 36.0 g/dL   RDW 15.1 11.5 - 15.5 %   Platelets 249 150 - 400 K/uL   Neutrophils Relative % 79 %   Neutro Abs 9.8 (H) 1.7 - 7.7 K/uL   Lymphocytes Relative 12 %   Lymphs Abs 1.5 0.7 - 4.0 K/uL   Monocytes Relative 8 %   Monocytes Absolute 0.9 0.1 - 1.0 K/uL   Eosinophils Relative 1 %   Eosinophils Absolute 0.1 0.0 - 0.7 K/uL   Basophils Relative 0 %   Basophils Absolute 0.0 0.0 - 0.1 K/uL  Comprehensive metabolic panel     Status: Abnormal   Collection Time: 12/27/16  4:18 AM  Result Value Ref Range   Sodium 139 135 - 145 mmol/L   Potassium 3.5 3.5 - 5.1 mmol/L   Chloride 109 101 - 111 mmol/L   CO2 22 22 - 32 mmol/L   Glucose, Bld 112 (H) 65 - 99 mg/dL   BUN 12 6 - 20 mg/dL   Creatinine, Ser 0.73 0.61 - 1.24  mg/dL   Calcium 8.8 (L) 8.9 - 10.3 mg/dL   Total Protein 6.4 (L) 6.5 - 8.1 g/dL   Albumin 2.7 (L) 3.5 - 5.0 g/dL   AST 31 15 - 41 U/L   ALT 38 17 - 63 U/L   Alkaline Phosphatase 73 38 - 126 U/L   Total Bilirubin 0.5 0.3 - 1.2 mg/dL   GFR calc non Af Amer >60 >60 mL/min   GFR calc Af Amer >60 >60 mL/min    Comment: (NOTE) The eGFR has been calculated using the CKD EPI equation. This calculation has not been validated in all clinical situations. eGFR's persistently <60 mL/min signify possible Chronic Kidney Disease.    Anion gap 8 5 - 15    ABGS No results for input(s): PHART, PO2ART, TCO2, HCO3 in the last 72 hours.  Invalid input(s): PCO2 CULTURES Recent Results (from the past 240 hour(s))  Culture, blood (Routine X 2) w Reflex to ID Panel     Status: None (Preliminary result)   Collection Time: 12/23/16 11:46 AM  Result Value Ref Range Status   Specimen Description BLOOD LEFT ARM  Final   Special Requests   Final    BOTTLES DRAWN AEROBIC AND ANAEROBIC Blood Culture adequate volume   Culture NO GROWTH 3 DAYS  Final   Report Status PENDING  Incomplete  Culture, blood (Routine X 2) w Reflex to ID Panel     Status: None (Preliminary result)   Collection Time: 12/23/16 11:52 AM  Result Value Ref Range Status   Specimen Description BLOOD LEFT ARM  Final   Special Requests   Final    BOTTLES DRAWN AEROBIC AND ANAEROBIC Blood Culture adequate volume   Culture NO GROWTH 3 DAYS  Final   Report Status PENDING  Incomplete  MRSA PCR Screening     Status: None   Collection Time: 12/23/16  2:50 PM  Result Value Ref Range Status   MRSA by PCR NEGATIVE NEGATIVE Final    Comment:        The  GeneXpert MRSA Assay (FDA approved for NASAL specimens only), is one component of a comprehensive MRSA colonization surveillance program. It is not intended to diagnose MRSA infection nor to guide or monitor treatment for MRSA infections.    Studies/Results: Dg Chest Port 1 View  Result  Date: 12/26/2016 CLINICAL DATA:  Oxygen desaturation question pneumonia, history hypertension EXAM: PORTABLE CHEST 1 VIEW COMPARISON:  Portable exam 1518 hours compared to 12/23/2016 FINDINGS: Upper normal size of cardiac silhouette. Mediastinal contours and pulmonary vascularity normal. Decreased lung volumes with bibasilar opacities question atelectasis versus infiltrate. Upper lungs clear. No gross pleural effusion or pneumothorax. IMPRESSION: Low lung volumes with mild bibasilar opacities question atelectasis versus infiltrate. Electronically Signed   By: Lavonia Dana M.D.   On: 12/26/2016 15:34    Medications:  Prior to Admission:  Medications Prior to Admission  Medication Sig Dispense Refill Last Dose  . ABILIFY 10 MG tablet Take 5 mg by mouth 2 (two) times daily.    12/25/2016 at 2000  . amantadine (SYMMETREL) 100 MG capsule Take 100 mg by mouth 2 (two) times daily.    12/25/2016 at 2000  . clonazePAM (KLONOPIN) 0.5 MG tablet Take 0.5 mg by mouth 2 (two) times daily.   12/25/2016 at 2000  . dicyclomine (BENTYL) 10 MG capsule TAKE 1 CAPSULE BY MOUTH 4 TIMES DAILY WITH MEALS AND AT BEDTIME AS NEEDED. HOLD FOR CONSTIPATION. 120 capsule 11 12/25/2016 at 2000  . lisinopril (PRINIVIL,ZESTRIL) 10 MG tablet Take 10 mg by mouth daily.   12/25/2016 at 2000  . pantoprazole (PROTONIX) 40 MG tablet TAKE 1 TABLET BY MOUTH ONCE DAILY BEFORE BREAKFAST. 90 tablet 2 12/25/2016 at 2000  . ZOLOFT 100 MG tablet Take 150 mg by mouth daily.    12/25/2016 at 1000  . Cetirizine HCl (ZYRTEC ALLERGY) 10 MG CAPS Take 1 capsule (10 mg total) by mouth daily as needed (for itching). (Patient not taking: Reported on 12/23/2016) 14 capsule 0 Not Taking at Unknown time   Scheduled: . amantadine  100 mg Oral BID  . ARIPiprazole  5 mg Oral BID  . Chlorhexidine Gluconate Cloth  6 each Topical Daily  . clonazePAM  0.5 mg Oral BID  . enoxaparin (LOVENOX) injection  40 mg Subcutaneous Q24H  . metoprolol tartrate  25 mg Oral  BID  . pantoprazole  40 mg Oral Daily  . potassium chloride  20 mEq Oral BID  . sertraline  150 mg Oral Daily  . sodium chloride flush  10-40 mL Intracatheter Q12H   Continuous: . 0.9 % NaCl with KCl 20 mEq / L 75 mL/hr at 12/25/16 1756  . piperacillin-tazobactam Stopped (12/27/16 0420)   DVV:[id number] **OR** acetaminophen, fentaNYL (SUBLIMAZE) injection, ondansetron **OR** ondansetron (ZOFRAN) IV, oxyCODONE-acetaminophen, simethicone, sodium chloride flush  Assesment: He was admitted with sepsis presumably from cholecystitis.  He had empyema of the gallbladder.  He feels much better.  He has had trouble with tachycardia but that seems better.  I am going to send him home on a beta-blocker at least temporarily he has had some atelectasis of his right lung and he will be on antibiotics for his gallbladder anyway. Active Problems:   Hypertension   Sepsis due to undetermined organism ([place])   Calculus of gallbladder with acute cholecystitis without obstruction   Hyponatremia   Hypokalemia   Empyema of gallbladder    Plan: Discharge home today    LOS: 4 days   Caty Tessler L 12/27/2016, 7:55 AM

## 2016-12-27 NOTE — Care Management Important Message (Signed)
Important Message  Patient Details  Name: Bobby Jimenez MRN: [id number] Date of Birth: [birth year]   Medicare Important Message Given:  Yes    Clifford Coudriet, Chrystine OilerSharley Diane, RN 12/27/2016, 11:03 AM

## 2016-12-27 NOTE — Discharge Instructions (Signed)
Laparoscopic Cholecystectomy, Care After °This sheet gives you information about how to care for yourself after your procedure. Your doctor may also give you more specific instructions. If you have problems or questions, contact your doctor. °Follow these instructions at home: °Care for cuts from surgery (incisions) ° °· Follow instructions from your doctor about how to take care of your cuts from surgery. Make sure you: °? Wash your hands with soap and water before you change your bandage (dressing). If you cannot use soap and water, use hand sanitizer. °? Change your bandage as told by your doctor. °? Leave stitches (sutures), skin glue, or skin tape (adhesive) strips in place. They may need to stay in place for 2 weeks or longer. If tape strips get loose and curl up, you may trim the loose edges. Do not remove tape strips completely unless your doctor says it is okay. °· Do not take baths, swim, or use a hot tub until your doctor says it is okay. Ask your doctor if you can take showers. You may only be allowed to take sponge baths for bathing. °· Check your surgical cut area every day for signs of infection. Check for: °? More redness, swelling, or pain. °? More fluid or blood. °? Warmth. °? Pus or a bad smell. °Activity °· Do not drive or use heavy machinery while taking prescription pain medicine. °· Do not lift anything that is heavier than 10 lb (4.5 kg) until your doctor says it is okay. °· Do not play contact sports until your doctor says it is okay. °· Do not drive for 24 hours if you were given a medicine to help you relax (sedative). °· Rest as needed. Do not return to work or school until your doctor says it is okay. °General instructions °· Take over-the-counter and prescription medicines only as told by your doctor. °· To prevent or treat constipation while you are taking prescription pain medicine, your doctor may recommend that you: °? Drink enough fluid to keep your pee (urine) clear or pale  yellow. °? Take over-the-counter or prescription medicines. °? Eat foods that are high in fiber, such as fresh fruits and vegetables, whole grains, and beans. °? Limit foods that are high in fat and processed sugars, such as fried and sweet foods. °Contact a doctor if: °· You develop a rash. °· You have more redness, swelling, or pain around your surgical cuts. °· You have more fluid or blood coming from your surgical cuts. °· Your surgical cuts feel warm to the touch. °· You have pus or a bad smell coming from your surgical cuts. °· You have a fever. °· One or more of your surgical cuts breaks open. °Get help right away if: °· You have trouble breathing. °· You have chest pain. °· You have pain that is getting worse in your shoulders. °· You faint or feel dizzy when you stand. °· You have very bad pain in your belly (abdomen). °· You are sick to your stomach (nauseous) for more than one day. °· You have throwing up (vomiting) that lasts for more than one day. °· You have leg pain. °This information is not intended to replace advice given to you by your health care provider. Make sure you discuss any questions you have with your health care provider. °Document Released: 10/25/2007 Document Revised: 08/06/2015 Document Reviewed: 07/04/2015 °Elsevier Interactive Patient Education © 2017 [employer] ° °

## 2016-12-28 LAB — CULTURE, BLOOD (ROUTINE X 2)
CULTURE: NO GROWTH
Culture: NO GROWTH
SPECIAL REQUESTS: ADEQUATE
Special Requests: ADEQUATE

## 2017-01-03 ENCOUNTER — Ambulatory Visit (INDEPENDENT_AMBULATORY_CARE_PROVIDER_SITE_OTHER): Payer: Self-pay | Admitting: General Surgery

## 2017-01-03 ENCOUNTER — Encounter: Payer: Self-pay | Admitting: General Surgery

## 2017-01-03 VITALS — BP 116/66 | HR 79 | Temp 97.7°F | Ht 70.0 in | Wt 238.0 lb

## 2017-01-03 DIAGNOSIS — Z09 Encounter for follow-up examination after completed treatment for conditions other than malignant neoplasm: Secondary | ICD-10-CM

## 2017-01-03 NOTE — Progress Notes (Signed)
Subjective:     Bobby CraftWilliam S Jimenez  Status post laparoscopic cholecystectomy.  Doing well.  No fever, chills, nausea, vomiting.  Having no significant abdominal pain. Objective:    BP 116/66   Pulse 79   Temp 97.7 F (36.5 C)   Ht 5\' 10"  (1.778 m)   Wt 238 lb (108 kg)   BMI 34.15 kg/m   General:  alert, cooperative and no distress  Abdomen soft, incisions healing well.  Staples removed, Steri-Strips applied. Final pathology consistent with diagnosis.     Assessment:    Doing well postoperatively.    Plan:   Increase activity as able.  Follow-up as needed.

## 2017-01-14 DIAGNOSIS — F5221 Male erectile disorder: Secondary | ICD-10-CM | POA: Diagnosis not present

## 2017-01-14 DIAGNOSIS — I10 Essential (primary) hypertension: Secondary | ICD-10-CM | POA: Diagnosis not present

## 2017-01-14 DIAGNOSIS — K819 Cholecystitis, unspecified: Secondary | ICD-10-CM | POA: Diagnosis not present

## 2017-01-14 DIAGNOSIS — J189 Pneumonia, unspecified organism: Secondary | ICD-10-CM | POA: Diagnosis not present

## 2017-02-06 DIAGNOSIS — F902 Attention-deficit hyperactivity disorder, combined type: Secondary | ICD-10-CM | POA: Diagnosis not present

## 2017-02-06 DIAGNOSIS — F3132 Bipolar disorder, current episode depressed, moderate: Secondary | ICD-10-CM | POA: Diagnosis not present

## 2017-02-06 DIAGNOSIS — F4011 Social phobia, generalized: Secondary | ICD-10-CM | POA: Diagnosis not present

## 2017-03-21 ENCOUNTER — Ambulatory Visit (INDEPENDENT_AMBULATORY_CARE_PROVIDER_SITE_OTHER): Payer: Medicare Other | Admitting: Otolaryngology

## 2017-03-21 DIAGNOSIS — G4733 Obstructive sleep apnea (adult) (pediatric): Secondary | ICD-10-CM

## 2017-03-21 DIAGNOSIS — H6983 Other specified disorders of Eustachian tube, bilateral: Secondary | ICD-10-CM

## 2017-03-21 DIAGNOSIS — H7203 Central perforation of tympanic membrane, bilateral: Secondary | ICD-10-CM

## 2017-03-25 DIAGNOSIS — F902 Attention-deficit hyperactivity disorder, combined type: Secondary | ICD-10-CM | POA: Diagnosis not present

## 2017-03-25 DIAGNOSIS — F4011 Social phobia, generalized: Secondary | ICD-10-CM | POA: Diagnosis not present

## 2017-03-25 DIAGNOSIS — F3132 Bipolar disorder, current episode depressed, moderate: Secondary | ICD-10-CM | POA: Diagnosis not present

## 2017-04-15 DIAGNOSIS — M545 Low back pain: Secondary | ICD-10-CM | POA: Diagnosis not present

## 2017-04-15 DIAGNOSIS — E785 Hyperlipidemia, unspecified: Secondary | ICD-10-CM | POA: Diagnosis not present

## 2017-04-15 DIAGNOSIS — F17200 Nicotine dependence, unspecified, uncomplicated: Secondary | ICD-10-CM | POA: Diagnosis not present

## 2017-04-15 DIAGNOSIS — I10 Essential (primary) hypertension: Secondary | ICD-10-CM | POA: Diagnosis not present

## 2017-05-20 DIAGNOSIS — F3132 Bipolar disorder, current episode depressed, moderate: Secondary | ICD-10-CM | POA: Diagnosis not present

## 2017-05-20 DIAGNOSIS — F902 Attention-deficit hyperactivity disorder, combined type: Secondary | ICD-10-CM | POA: Diagnosis not present

## 2017-05-20 DIAGNOSIS — F4011 Social phobia, generalized: Secondary | ICD-10-CM | POA: Diagnosis not present

## 2017-06-10 ENCOUNTER — Other Ambulatory Visit: Payer: Self-pay | Admitting: Nurse Practitioner

## 2017-07-10 DIAGNOSIS — F3132 Bipolar disorder, current episode depressed, moderate: Secondary | ICD-10-CM | POA: Diagnosis not present

## 2017-07-10 DIAGNOSIS — F4011 Social phobia, generalized: Secondary | ICD-10-CM | POA: Diagnosis not present

## 2017-07-10 DIAGNOSIS — F902 Attention-deficit hyperactivity disorder, combined type: Secondary | ICD-10-CM | POA: Diagnosis not present

## 2017-07-15 DIAGNOSIS — F17200 Nicotine dependence, unspecified, uncomplicated: Secondary | ICD-10-CM | POA: Diagnosis not present

## 2017-07-15 DIAGNOSIS — I10 Essential (primary) hypertension: Secondary | ICD-10-CM | POA: Diagnosis not present

## 2017-07-15 DIAGNOSIS — M545 Low back pain: Secondary | ICD-10-CM | POA: Diagnosis not present

## 2017-07-15 DIAGNOSIS — F319 Bipolar disorder, unspecified: Secondary | ICD-10-CM | POA: Diagnosis not present

## 2017-07-15 DIAGNOSIS — E785 Hyperlipidemia, unspecified: Secondary | ICD-10-CM | POA: Diagnosis not present

## 2017-07-16 DIAGNOSIS — E785 Hyperlipidemia, unspecified: Secondary | ICD-10-CM | POA: Diagnosis not present

## 2017-07-16 DIAGNOSIS — M545 Low back pain: Secondary | ICD-10-CM | POA: Diagnosis not present

## 2017-07-16 DIAGNOSIS — I10 Essential (primary) hypertension: Secondary | ICD-10-CM | POA: Diagnosis not present

## 2017-07-16 DIAGNOSIS — F319 Bipolar disorder, unspecified: Secondary | ICD-10-CM | POA: Diagnosis not present

## 2017-09-10 DIAGNOSIS — F4011 Social phobia, generalized: Secondary | ICD-10-CM | POA: Diagnosis not present

## 2017-09-10 DIAGNOSIS — F902 Attention-deficit hyperactivity disorder, combined type: Secondary | ICD-10-CM | POA: Diagnosis not present

## 2017-09-10 DIAGNOSIS — F3132 Bipolar disorder, current episode depressed, moderate: Secondary | ICD-10-CM | POA: Diagnosis not present

## 2017-10-03 ENCOUNTER — Ambulatory Visit (INDEPENDENT_AMBULATORY_CARE_PROVIDER_SITE_OTHER): Payer: Medicare Other | Admitting: Otolaryngology

## 2017-10-03 DIAGNOSIS — H66012 Acute suppurative otitis media with spontaneous rupture of ear drum, left ear: Secondary | ICD-10-CM

## 2017-10-15 DIAGNOSIS — E785 Hyperlipidemia, unspecified: Secondary | ICD-10-CM | POA: Diagnosis not present

## 2017-10-17 ENCOUNTER — Ambulatory Visit (INDEPENDENT_AMBULATORY_CARE_PROVIDER_SITE_OTHER): Payer: Medicare Other | Admitting: Otolaryngology

## 2017-10-17 DIAGNOSIS — I10 Essential (primary) hypertension: Secondary | ICD-10-CM | POA: Diagnosis not present

## 2017-10-17 DIAGNOSIS — E785 Hyperlipidemia, unspecified: Secondary | ICD-10-CM | POA: Diagnosis not present

## 2017-10-17 DIAGNOSIS — H6121 Impacted cerumen, right ear: Secondary | ICD-10-CM

## 2017-10-17 DIAGNOSIS — H66012 Acute suppurative otitis media with spontaneous rupture of ear drum, left ear: Secondary | ICD-10-CM | POA: Diagnosis not present

## 2017-10-17 DIAGNOSIS — H6982 Other specified disorders of Eustachian tube, left ear: Secondary | ICD-10-CM | POA: Diagnosis not present

## 2017-10-17 DIAGNOSIS — F319 Bipolar disorder, unspecified: Secondary | ICD-10-CM | POA: Diagnosis not present

## 2017-10-17 DIAGNOSIS — E669 Obesity, unspecified: Secondary | ICD-10-CM | POA: Diagnosis not present

## 2017-10-25 ENCOUNTER — Other Ambulatory Visit: Payer: Self-pay | Admitting: Gastroenterology

## 2017-10-25 ENCOUNTER — Other Ambulatory Visit (HOSPITAL_BASED_OUTPATIENT_CLINIC_OR_DEPARTMENT_OTHER): Payer: Self-pay

## 2017-10-25 DIAGNOSIS — G4730 Sleep apnea, unspecified: Secondary | ICD-10-CM

## 2017-10-25 DIAGNOSIS — R0683 Snoring: Secondary | ICD-10-CM

## 2017-10-29 ENCOUNTER — Other Ambulatory Visit: Payer: Self-pay

## 2017-10-29 LAB — HEPATIC FUNCTION PANEL
ALT: 51 — AB (ref 10–40)
AST: 33 (ref 14–40)
Alkaline Phosphatase: 65 (ref 25–125)
Bilirubin, Direct: 0.1 (ref 0.01–0.4)
Bilirubin, Total: 0.4

## 2017-10-29 LAB — COMPREHENSIVE METABOLIC PANEL
Albumin: 4.6 (ref 3.5–5.0)
Globulin: 2.9

## 2017-10-29 LAB — LIPID PANEL
Cholesterol: 289 — AB (ref 0–200)
HDL: 36 (ref 35–70)
LDl/HDL Ratio: 8

## 2017-10-29 MED ORDER — DICYCLOMINE HCL 10 MG PO CAPS: ORAL_CAPSULE | ORAL | 5 refills | Status: DC

## 2017-10-29 NOTE — Telephone Encounter (Signed)
Called and informed pt rx was sent

## 2017-10-30 ENCOUNTER — Ambulatory Visit: Payer: Medicare Other | Attending: Pulmonary Disease | Admitting: Neurology

## 2017-10-30 DIAGNOSIS — G4733 Obstructive sleep apnea (adult) (pediatric): Secondary | ICD-10-CM | POA: Diagnosis not present

## 2017-10-30 DIAGNOSIS — G4730 Sleep apnea, unspecified: Secondary | ICD-10-CM

## 2017-10-30 DIAGNOSIS — Z79899 Other long term (current) drug therapy: Secondary | ICD-10-CM | POA: Insufficient documentation

## 2017-10-30 DIAGNOSIS — R0683 Snoring: Secondary | ICD-10-CM

## 2017-11-02 NOTE — Procedures (Signed)
[place] A. Merlene Laughter, MD     [email or web address]             NOCTURNAL POLYSOMNOGRAPHY   LOCATION: [facility]   Patient Name: Bobby Jimenez, Bobby Jimenez Date: 10/30/2017 Gender: Male D.O.B: [birth year] Age (years): 36 Referring Provider: Sinda Du Height (inches): 70 Interpreting Physician: Phillips Odor MD, ABSM Weight (lbs): 251 RPSGT: Peak, Robert BMI: 36 MRN: [id number] Neck Size: 19.50 CLINICAL INFORMATION Sleep Study Type: Split Night CPAP  Indication for sleep study: Snoring  Epworth Sleepiness Score: 1  SLEEP STUDY TECHNIQUE As per the AASM Manual for the Scoring of Sleep and Associated Events v2.3 (April 2016) with a hypopnea requiring 4% desaturations.  The channels recorded and monitored were frontal, central and occipital EEG, electrooculogram (EOG), submentalis EMG (chin), nasal and oral airflow, thoracic and abdominal wall motion, anterior tibialis EMG, snore microphone, electrocardiogram, and pulse oximetry. Continuous positive airway pressure (CPAP) was initiated when the patient met split night criteria and was titrated according to treat sleep-disordered breathing.  MEDICATIONS Medications self-administered by patient taken the night of the study : DICYCLOMINE, AMANTADINE, ARIPIPRAZOLE, CLONAZEPAM, TOPIRAMATE  Current Outpatient Medications:  .  ABILIFY 10 MG tablet, Take 5 mg by mouth 2 (two) times daily. , Disp: , Rfl:  .  amantadine (SYMMETREL) 100 MG capsule, Take 100 mg by mouth 2 (two) times daily. , Disp: , Rfl:  .  Cetirizine HCl (ZYRTEC ALLERGY) 10 MG CAPS, Take 1 capsule (10 mg total) by mouth daily as needed (for itching). (Patient not taking: Reported on 12/23/2016), Disp: 14 capsule, Rfl: 0 .  clonazePAM (KLONOPIN) 0.5 MG tablet, Take 0.5 mg by mouth 2 (two) times daily., Disp: , Rfl:  .  dicyclomine (BENTYL) 10 MG capsule, TAKE 1 CAPSULE BY MOUTH 4 TIMES DAILY WITH MEALS AND AT BEDTIME AS NEEDED. HOLD FOR CONSTIPATION.,  Disp: 120 capsule, Rfl: 2 .  dicyclomine (BENTYL) 10 MG capsule, TAKE 1 CAPSULE BY MOUTH 4 TIMES DAILY WITH MEALS AND AT BEDTIME AS NEEDED. HOLD FOR CONSTIPATION., Disp: 120 capsule, Rfl: 5 .  lisinopril (PRINIVIL,ZESTRIL) 10 MG tablet, Take 10 mg by mouth daily., Disp: , Rfl:  .  metoprolol tartrate (LOPRESSOR) 25 MG tablet, Take 1 tablet (25 mg total) by mouth 2 (two) times daily., Disp: 60 tablet, Rfl: 1 .  oxyCODONE-acetaminophen (PERCOCET/ROXICET) 5-325 MG tablet, Take 1 tablet by mouth every 4 (four) hours as needed for moderate pain., Disp: 40 tablet, Rfl: 0 .  pantoprazole (PROTONIX) 40 MG tablet, TAKE 1 TABLET BY MOUTH ONCE DAILY BEFORE BREAKFAST., Disp: 90 tablet, Rfl: 3 .  ZOLOFT 100 MG tablet, Take 150 mg by mouth daily. , Disp: , Rfl:    RESPIRATORY PARAMETERS Diagnostic  Total AHI (/hr): 32.1 RDI (/hr): 35.4 OA Index (/hr): 0.7 CA Index (/hr): 0.0 REM AHI (/hr): N/A NREM AHI (/hr): 32.1 Supine AHI (/hr): N/A Non-supine AHI (/hr): 32.1 Min O2 Sat (%): 82.0 Mean O2 (%): 90.9 Time below 88% (min): 28.5   Titration  Optimal Pressure (cm): 8 AHI at Optimal Pressure (/hr): 4.1 Min O2 at Optimal Pressure (%): 93.0 Supine % at Optimal (%): 0 Sleep % at Optimal (%): 73   SLEEP ARCHITECTURE The recording time for the entire night was 451 minutes.  During a baseline period of 224.3 minutes, the patient slept for 179.5 minutes in REM and nonREM, yielding a sleep efficiency of 80.0%%. Sleep onset after lights out was 26.2 minutes with a REM latency of N/A minutes. The patient spent 17.5%% of the  night in stage N1 sleep, 82.5%% in stage N2 sleep, 0.0%% in stage N3 and 0% in REM.  During the titration period of 224.6 minutes, the patient slept for 183.8 minutes in REM and nonREM, yielding a sleep efficiency of 81.9%%. Sleep onset after CPAP initiation was 7.8 minutes with a REM latency of 76.0 minutes. The patient spent 11.2%% of the night in stage N1 sleep, 79.3%% in stage N2 sleep, 0.0%% in  stage N3 and 9.5% in REM.  CARDIAC DATA The 2 lead EKG demonstrated sinus rhythm. The mean heart rate was 100.0 beats per minute. Other EKG findings include: None. LEG MOVEMENT DATA The total Periodic Limb Movements of Sleep (PLMS) were 0. The PLMS index was 0.0.  IMPRESSIONS 1. Severe obstructive sleep apnea occurred during the diagnostic portion of the study (AHI = 32.1/hour). The optimal CPAP selected for this patient is ( 8 cm of water).    Delano Metz, MD Diplomate, American Board of Sleep Medicine.  ELECTRONICALLY SIGNED ON:  11/02/2017, 6:17 PM [place] PH: [phone]   FX: [phone] [place]

## 2017-11-07 DIAGNOSIS — F3132 Bipolar disorder, current episode depressed, moderate: Secondary | ICD-10-CM | POA: Diagnosis not present

## 2017-11-07 DIAGNOSIS — F4011 Social phobia, generalized: Secondary | ICD-10-CM | POA: Diagnosis not present

## 2017-11-07 DIAGNOSIS — F902 Attention-deficit hyperactivity disorder, combined type: Secondary | ICD-10-CM | POA: Diagnosis not present

## 2018-01-06 DIAGNOSIS — F3132 Bipolar disorder, current episode depressed, moderate: Secondary | ICD-10-CM | POA: Diagnosis not present

## 2018-01-06 DIAGNOSIS — F4011 Social phobia, generalized: Secondary | ICD-10-CM | POA: Diagnosis not present

## 2018-01-06 DIAGNOSIS — F902 Attention-deficit hyperactivity disorder, combined type: Secondary | ICD-10-CM | POA: Diagnosis not present

## 2018-01-16 DIAGNOSIS — E785 Hyperlipidemia, unspecified: Secondary | ICD-10-CM | POA: Diagnosis not present

## 2018-01-16 DIAGNOSIS — M545 Low back pain: Secondary | ICD-10-CM | POA: Diagnosis not present

## 2018-01-16 DIAGNOSIS — I10 Essential (primary) hypertension: Secondary | ICD-10-CM | POA: Diagnosis not present

## 2018-01-16 DIAGNOSIS — F319 Bipolar disorder, unspecified: Secondary | ICD-10-CM | POA: Diagnosis not present

## 2018-01-20 DIAGNOSIS — I10 Essential (primary) hypertension: Secondary | ICD-10-CM | POA: Diagnosis not present

## 2018-01-20 DIAGNOSIS — M545 Low back pain: Secondary | ICD-10-CM | POA: Diagnosis not present

## 2018-01-20 DIAGNOSIS — F319 Bipolar disorder, unspecified: Secondary | ICD-10-CM | POA: Diagnosis not present

## 2018-01-20 DIAGNOSIS — E785 Hyperlipidemia, unspecified: Secondary | ICD-10-CM | POA: Diagnosis not present

## 2018-01-21 LAB — LIPID PANEL
Cholesterol: 224 — AB (ref 0–200)
HDL: 32 — AB (ref 35–70)
LDl/HDL Ratio: 7
Triglycerides: 745 — AB (ref 40–160)

## 2018-03-05 DIAGNOSIS — F3132 Bipolar disorder, current episode depressed, moderate: Secondary | ICD-10-CM | POA: Diagnosis not present

## 2018-03-05 DIAGNOSIS — F902 Attention-deficit hyperactivity disorder, combined type: Secondary | ICD-10-CM | POA: Diagnosis not present

## 2018-03-05 DIAGNOSIS — F4011 Social phobia, generalized: Secondary | ICD-10-CM | POA: Diagnosis not present

## 2018-03-20 ENCOUNTER — Ambulatory Visit (INDEPENDENT_AMBULATORY_CARE_PROVIDER_SITE_OTHER): Payer: Medicare Other | Admitting: Otolaryngology

## 2018-03-20 DIAGNOSIS — H7203 Central perforation of tympanic membrane, bilateral: Secondary | ICD-10-CM

## 2018-03-20 DIAGNOSIS — H6983 Other specified disorders of Eustachian tube, bilateral: Secondary | ICD-10-CM | POA: Diagnosis not present

## 2018-03-26 ENCOUNTER — Other Ambulatory Visit: Payer: Self-pay | Admitting: Nurse Practitioner

## 2018-04-17 DIAGNOSIS — I10 Essential (primary) hypertension: Secondary | ICD-10-CM | POA: Diagnosis not present

## 2018-04-17 DIAGNOSIS — M545 Low back pain: Secondary | ICD-10-CM | POA: Diagnosis not present

## 2018-04-17 DIAGNOSIS — M542 Cervicalgia: Secondary | ICD-10-CM | POA: Diagnosis not present

## 2018-04-17 DIAGNOSIS — F319 Bipolar disorder, unspecified: Secondary | ICD-10-CM | POA: Diagnosis not present

## 2018-05-01 DIAGNOSIS — F4011 Social phobia, generalized: Secondary | ICD-10-CM | POA: Diagnosis not present

## 2018-05-01 DIAGNOSIS — F3132 Bipolar disorder, current episode depressed, moderate: Secondary | ICD-10-CM | POA: Diagnosis not present

## 2018-05-01 DIAGNOSIS — F902 Attention-deficit hyperactivity disorder, combined type: Secondary | ICD-10-CM | POA: Diagnosis not present

## 2018-06-17 NOTE — Progress Notes (Signed)
REVIEWED-NO ADDITIONAL RECOMMENDATIONS. 

## 2018-06-26 ENCOUNTER — Ambulatory Visit (INDEPENDENT_AMBULATORY_CARE_PROVIDER_SITE_OTHER): Payer: Medicare Other | Admitting: Otolaryngology

## 2018-06-26 ENCOUNTER — Other Ambulatory Visit: Payer: Self-pay

## 2018-06-26 DIAGNOSIS — H66013 Acute suppurative otitis media with spontaneous rupture of ear drum, bilateral: Secondary | ICD-10-CM

## 2018-06-30 DIAGNOSIS — F902 Attention-deficit hyperactivity disorder, combined type: Secondary | ICD-10-CM | POA: Diagnosis not present

## 2018-06-30 DIAGNOSIS — F4011 Social phobia, generalized: Secondary | ICD-10-CM | POA: Diagnosis not present

## 2018-06-30 DIAGNOSIS — F3132 Bipolar disorder, current episode depressed, moderate: Secondary | ICD-10-CM | POA: Diagnosis not present

## 2018-07-10 ENCOUNTER — Ambulatory Visit (INDEPENDENT_AMBULATORY_CARE_PROVIDER_SITE_OTHER): Payer: Medicare Other | Admitting: Otolaryngology

## 2018-07-10 DIAGNOSIS — H6983 Other specified disorders of Eustachian tube, bilateral: Secondary | ICD-10-CM

## 2018-07-10 DIAGNOSIS — H7203 Central perforation of tympanic membrane, bilateral: Secondary | ICD-10-CM

## 2018-07-21 DIAGNOSIS — I10 Essential (primary) hypertension: Secondary | ICD-10-CM | POA: Diagnosis not present

## 2018-07-21 DIAGNOSIS — M545 Low back pain: Secondary | ICD-10-CM | POA: Diagnosis not present

## 2018-07-21 DIAGNOSIS — H6693 Otitis media, unspecified, bilateral: Secondary | ICD-10-CM | POA: Diagnosis not present

## 2018-07-21 DIAGNOSIS — F319 Bipolar disorder, unspecified: Secondary | ICD-10-CM | POA: Diagnosis not present

## 2018-08-02 ENCOUNTER — Other Ambulatory Visit: Payer: Self-pay | Admitting: Gastroenterology

## 2018-08-27 DIAGNOSIS — F4011 Social phobia, generalized: Secondary | ICD-10-CM | POA: Diagnosis not present

## 2018-08-27 DIAGNOSIS — F902 Attention-deficit hyperactivity disorder, combined type: Secondary | ICD-10-CM | POA: Diagnosis not present

## 2018-08-27 DIAGNOSIS — F3132 Bipolar disorder, current episode depressed, moderate: Secondary | ICD-10-CM | POA: Diagnosis not present

## 2018-09-03 ENCOUNTER — Encounter: Payer: Self-pay | Admitting: Internal Medicine

## 2018-09-08 ENCOUNTER — Other Ambulatory Visit: Payer: Self-pay | Admitting: Gastroenterology

## 2018-09-22 DIAGNOSIS — W57XXXA Bitten or stung by nonvenomous insect and other nonvenomous arthropods, initial encounter: Secondary | ICD-10-CM | POA: Diagnosis not present

## 2018-09-22 DIAGNOSIS — R21 Rash and other nonspecific skin eruption: Secondary | ICD-10-CM | POA: Diagnosis not present

## 2018-10-21 ENCOUNTER — Encounter: Payer: Self-pay | Admitting: Nurse Practitioner

## 2018-10-21 ENCOUNTER — Other Ambulatory Visit: Payer: Self-pay

## 2018-10-21 ENCOUNTER — Ambulatory Visit (INDEPENDENT_AMBULATORY_CARE_PROVIDER_SITE_OTHER): Payer: Medicare Other | Admitting: Nurse Practitioner

## 2018-10-21 ENCOUNTER — Encounter: Payer: Self-pay | Admitting: *Deleted

## 2018-10-21 VITALS — BP 115/74 | HR 79 | Temp 96.8°F | Ht 70.0 in | Wt 258.2 lb

## 2018-10-21 DIAGNOSIS — K219 Gastro-esophageal reflux disease without esophagitis: Secondary | ICD-10-CM

## 2018-10-21 DIAGNOSIS — R109 Unspecified abdominal pain: Secondary | ICD-10-CM | POA: Diagnosis not present

## 2018-10-21 DIAGNOSIS — R197 Diarrhea, unspecified: Secondary | ICD-10-CM | POA: Diagnosis not present

## 2018-10-21 NOTE — Progress Notes (Signed)
Referring Provider: Kari Baars, MD Primary Care Physician:  Kari Baars, MD Primary GI:  Dr. Jena Gauss  Chief Complaint  Patient presents with  . Abdominal Pain    HPI:   Bobby Jimenez is a 38 y.o. male who presents for follow-up.  Patient was last seen in our office 09/17/2016 for GERD and diarrhea.  This is a follow-up for further refills.  History of GERD, probable IBS.  If he misses his pantoprazole he will have recurrent heartburn and vomiting.  No dysphagia.  Typically 2 bowel movements a day as long as he takes dicyclomine 3-4 times a day which also helps with abdominal cramping.  No other GI complaints.  Recommended continue Protonix and Bentyl as before, follow-up in 2 years or sooner if needed.  No history of colonoscopy or endoscopy in our system.  Not currently due for either.  Today he states he's doing ok overall. GERD has been doing well on PPI. IBS doing well on Bentyl. Has been having some worsening mid to lower abdominal pain/point tenderness just to the left of his umbilicus. CT in 2018 had a tiny fat-containing umbilical hernia. This pain doesn't improve with a bowel movement. Described as a constant dull pain, hasn't gone away in a few days. Denies recent heavy lifting. Denies hematochezia, melena, fever, chills, unintentional weight loss. Denies URI or flu-like symptoms. Denies loss of sense of taste or smell. Denies chest pain, dyspnea, dizziness, lightheadedness, syncope, near syncope. Denies any other upper or lower GI symptoms.  Past Medical History:  Diagnosis Date  . ADHD (attention deficit hyperactivity disorder)   . Anxiety   . Bipolar disorder (HCC)   . Cervical radiculopathy   . Depression   . Dizziness   . Headache   . Hypertension   . Lightheaded   . Low back pain   . Vertigo     Past Surgical History:  Procedure Laterality Date  . CHOLECYSTECTOMY N/A 12/26/2016   Procedure: LAPAROSCOPIC CHOLECYSTECTOMY;  Surgeon: Franky Macho, MD;   Location: [facility];  Service: General;  Laterality: N/A;  . KNEE SURGERY Right   . WISDOM TOOTH EXTRACTION      Current Outpatient Medications  Medication Sig Dispense Refill  . ABILIFY 10 MG tablet Take 5 mg by mouth 2 (two) times daily.     Marland Kitchen amantadine (SYMMETREL) 100 MG capsule Take 100 mg by mouth 2 (two) times daily.     . clonazePAM (KLONOPIN) 0.5 MG tablet Take 0.25-0.5 mg by mouth 2 (two) times daily. Takes 1/2 tablet in am and 1 tablet at bedtime    . dicyclomine (BENTYL) 10 MG capsule TAKE 1 CAPSULE BY MOUTH 4 TIMES DAILY WITH MEALS AND AT BEDTIME AS NEEDED. HOLD FOR CONSTIPATION. 120 capsule 5  . lisinopril (PRINIVIL,ZESTRIL) 10 MG tablet Take 10 mg by mouth daily.    . metoprolol tartrate (LOPRESSOR) 25 MG tablet Take 1 tablet (25 mg total) by mouth 2 (two) times daily. 60 tablet 1  . Multiple Vitamin (MULTIVITAMIN) tablet Take 1 tablet by mouth daily.    . pantoprazole (PROTONIX) 40 MG tablet TAKE 1 TABLET BY MOUTH ONCE DAILY BEFORE BREAKFAST. 90 tablet 3  . rosuvastatin (CRESTOR) 10 MG tablet Take 1 tablet by mouth daily.    Marland Kitchen topiramate (TOPAMAX) 200 MG tablet Take 1 tablet by mouth 2 (two) times daily.    Marland Kitchen VASCEPA 1 g CAPS Take 2 capsules by mouth 2 (two) times daily.    Marland Kitchen ZOLOFT 100 MG  tablet Take 150 mg by mouth daily.      No current facility-administered medications for this visit.     Allergies as of 10/21/2018 - Review Complete 10/21/2018  Allergen Reaction Noted  . Pineapple Shortness Of Breath and Swelling 05/14/2014  . Latex Swelling 05/14/2014  . Morphine and related Hives and Itching 05/14/2014    Family History  Adopted: Yes  Problem Relation Age of Onset  . High blood pressure Mother   . Seizures Mother   . Colon cancer Neg Hx     Social History   Socioeconomic History  . Marital status: Divorced    Spouse name: Not on file  . Number of children: 0  . Years of education: 17  . Highest education level: Not on file  Occupational History  . Not  on file  Social Needs  . Financial resource strain: Not on file  . Food insecurity    Worry: Not on file    Inability: Not on file  . Transportation needs    Medical: Not on file    Non-medical: Not on file  Tobacco Use  . Smoking status: Former Smoker    Packs/day: 1.00    Types: Cigarettes  . Smokeless tobacco: Former Neurosurgeon  . Tobacco comment: quit Nov 2018  Substance and Sexual Activity  . Alcohol use: Yes    Alcohol/week: 2.0 standard drinks    Types: 2 Cans of beer per week    Comment: heavy in past, short period of time in 20s.; 10/21/18 occ  . Drug use: No    Comment: Quit 1996  . Sexual activity: Not on file  Lifestyle  . Physical activity    Days per week: Not on file    Minutes per session: Not on file  . Stress: Not on file  Relationships  . Social Musician on phone: Not on file    Gets together: Not on file    Attends religious service: Not on file    Active member of club or organization: Not on file    Attends meetings of clubs or organizations: Not on file    Relationship status: Not on file  Other Topics Concern  . Not on file  Social History Narrative   Patient lives at home with his father and unemployed.   Education some college.   Both handed.   Caffeine three cups daily soda and coffee.    Review of Systems: General: Negative for anorexia, weight loss, fever, chills, fatigue, weakness. Eyes: Negative for vision changes.  ENT: Negative for hoarseness, difficulty swallowing , nasal congestion. CV: Negative for chest pain, angina, palpitations, dyspnea on exertion, peripheral edema.  Respiratory: Negative for dyspnea at rest, dyspnea on exertion, cough, sputum, wheezing.  GI: See history of present illness. GU:  Negative for dysuria, hematuria, urinary incontinence, urinary frequency, nocturnal urination.  MS: Negative for joint pain, low back pain.  Derm: Negative for rash or itching.  Neuro: Negative for weakness, abnormal  sensation, seizure, frequent headaches, memory loss, confusion.  Psych: Negative for anxiety, depression, suicidal ideation, hallucinations.  Endo: Negative for unusual weight change.  Heme: Negative for bruising or bleeding. Allergy: Negative for rash or hives.   Physical Exam: BP 115/74   Pulse 79   Temp (!) 96.8 F (36 C) (Temporal)   Ht 5\' 10"  (1.778 m)   Wt 258 lb 3.2 oz (117.1 kg)   BMI 37.05 kg/m  General:   Alert and oriented. Pleasant and  cooperative. Well-nourished and well-developed.  Head:  Normocephalic and atraumatic. Eyes:  Without icterus, sclera clear and conjunctiva pink.  Ears:  Normal auditory acuity. Mouth:  No deformity or lesions, oral mucosa pink.  Throat/Neck:  Supple, without mass or thyromegaly. Cardiovascular:  S1, S2 present without murmurs appreciated. Normal pulses noted. Extremities without clubbing or edema. Respiratory:  Clear to auscultation bilaterally. No wheezes, rales, or rhonchi. No distress.  Gastrointestinal:  +BS, soft, non-tender and non-distended. No HSM noted. No guarding or rebound. No masses appreciated.  Rectal:  Deferred  Musculoskalatal:  Symmetrical without gross deformities. Normal posture. Skin:  Intact without significant lesions or rashes. Neurologic:  Alert and oriented x4;  grossly normal neurologically. Psych:  Alert and cooperative. Normal mood and affect. Heme/Lymph/Immune: No significant cervical adenopathy. No excessive bruising noted.    10/21/2018 3:13 PM   Disclaimer: This note was dictated with voice recognition software. Similar sounding words can inadvertently be transcribed and may not be corrected upon review.

## 2018-10-21 NOTE — Patient Instructions (Signed)
Your health issues we discussed today were:   Diarrhea: 1. I am glad you are still doing well 2. Continue taking Bentyl as needed for diarrhea 3. Call us if you have any worsening or severe symptoms  GERD (reflux/heartburn): 1. I am glad your GERD symptoms continue to be well managed 2. Continue taking your acid blockers as you have been 3. Call us for any worsening or severe symptoms  Abdominal pain: 1. We will check an abdominal ultrasound to look for causes of your point tenderness in your left sided abdomen 2. Further recommendations to follow 3. Call us if you have any worsening or severe symptoms  Overall I recommend:  1. Return for follow-up in 2 months 2. Continue your other current medications 3. Call us if you have any questions or concerns.   Because of recent events of COVID-19 ("Coronavirus"), follow CDC recommendations:  1. Wash your hand frequently 2. Avoid touching your face 3. Stay away from people who are sick 4. If you have symptoms such as fever, cough, shortness of breath then call your healthcare provider for further guidance 5. If you are sick, STAY AT HOME unless otherwise directed by your healthcare provider. 6. Follow directions from state and national officials regarding staying safe   At [facility] we value your feedback. You may receive a survey about your visit today. Please share your experience as we strive to create trusting relationships with our patients to provide genuine, compassionate, quality care.  We appreciate your understanding and patience as we review any laboratory studies, imaging, and other diagnostic tests that are ordered as we care for you. Our office policy is 5 business days for review of these results, and any emergent or urgent results are addressed in a timely manner for your best interest. If you do not hear from our office in 1 week, please contact us.   We also encourage the use of MyChart, which contains  your medical information for your review as well. If you are not enrolled in this feature, an access code is on this after visit summary for your convenience. Thank you for allowing [place] to be involved in your care.  It was great to see you today!  I hope you have a great Fall!!

## 2018-10-21 NOTE — Assessment & Plan Note (Signed)
Diarrhea likely IBS type.  He has been doing well on Bentyl.  Recommend he continue taking Bentyl and follow-up in 2 months.

## 2018-10-21 NOTE — Assessment & Plan Note (Signed)
History of chronic GERD, currently well managed on PPI.  Minimal to no breakthrough symptoms.  Recommend he continue his current medications and follow-up in 2 months.

## 2018-10-21 NOTE — Assessment & Plan Note (Signed)
The patient notes point tenderness in the abdomen just left of the umbilicus.  This is new pain for him and different than his typical IBS type pain.  Previous CT in the end of 2018 found small umbilical hernia.  Query worsening of his hernia because of point tenderness.  I will plan for an ultrasound to further evaluate.  Otherwise continue current medications and follow-up in 2 months.  Call if any worsening or severe symptoms

## 2018-10-22 ENCOUNTER — Encounter: Payer: Self-pay | Admitting: Internal Medicine

## 2018-10-22 ENCOUNTER — Ambulatory Visit: Payer: Medicare Other | Admitting: Gastroenterology

## 2018-10-22 DIAGNOSIS — F3132 Bipolar disorder, current episode depressed, moderate: Secondary | ICD-10-CM | POA: Diagnosis not present

## 2018-10-22 DIAGNOSIS — F4011 Social phobia, generalized: Secondary | ICD-10-CM | POA: Diagnosis not present

## 2018-10-22 DIAGNOSIS — F902 Attention-deficit hyperactivity disorder, combined type: Secondary | ICD-10-CM | POA: Diagnosis not present

## 2018-10-24 ENCOUNTER — Ambulatory Visit (HOSPITAL_COMMUNITY)
Admission: RE | Admit: 2018-10-24 | Discharge: 2018-10-24 | Disposition: A | Discharge status: Home / Self Care | Payer: Medicare Other | Source: Ambulatory Visit | Attending: Nurse Practitioner | Admitting: Nurse Practitioner

## 2018-10-24 ENCOUNTER — Other Ambulatory Visit: Payer: Self-pay

## 2018-10-24 DIAGNOSIS — K219 Gastro-esophageal reflux disease without esophagitis: Secondary | ICD-10-CM | POA: Diagnosis not present

## 2018-10-24 DIAGNOSIS — R109 Unspecified abdominal pain: Secondary | ICD-10-CM

## 2018-10-24 DIAGNOSIS — R197 Diarrhea, unspecified: Secondary | ICD-10-CM

## 2018-10-27 ENCOUNTER — Telehealth: Payer: Self-pay

## 2018-10-27 DIAGNOSIS — K760 Fatty (change of) liver, not elsewhere classified: Secondary | ICD-10-CM

## 2018-10-27 NOTE — Telephone Encounter (Signed)
Received a call from [place] (Radiology), she wanted to make sure pt's u/s results were visible in our system. U/s results have resulted d/p pt's u/s on Friday 10/24/2018. Please review in the absence of EG.

## 2018-10-28 ENCOUNTER — Telehealth: Payer: Self-pay | Admitting: Internal Medicine

## 2018-10-28 NOTE — Telephone Encounter (Signed)
Lmom, waiting on a return call. Pt is inquiring about his CT results. Message left for pt on VM, letting pt know if his symptoms worsened, vomiting uncontrollable, lightheaded, ect, he should proceed to the ED.

## 2018-10-28 NOTE — Telephone Encounter (Signed)
Noted. Will wait for EG's recommendations.

## 2018-10-28 NOTE — Telephone Encounter (Signed)
Markedly fatty liver on [place]. There is an area in the liver that could be fatty infiltration but unable to rule out lesion. Please address with Starleen Arms 9/30 to determine best next imaging modality: CT vs MRI.

## 2018-10-28 NOTE — Telephone Encounter (Signed)
Pt said he had an U/S on Friday and is having abd pain. He was checking to see if his results were back yet. Please advise. [phone]

## 2018-10-29 NOTE — Telephone Encounter (Signed)
Pt returned call. Pt is continuing to have sharp stabbing abdominal pain. Some days pt says his pain level is a 10 and on other days, he's able to tolerate the pain. On the days when pt can tolerate pain, he can eat without having diarrhea. On the days when the pain is unbearable, pt has diarrhea with not much of an appetite. Pt is taking Pantoprazole and Bentyl, which pt has been on for 2 years. Pt is aware that we will contact him with his CT results.

## 2018-10-31 NOTE — Addendum Note (Signed)
Addended by: Gordy Levan, ERIC A on: 10/31/2018 02:12 PM   Modules accepted: Orders

## 2018-10-31 NOTE — Telephone Encounter (Signed)
CT ordered/pending. If pain becomes worse, or inability to keep down food/fluids then proceed to the ER.

## 2018-10-31 NOTE — Telephone Encounter (Addendum)
Will plan for CT abdomen based on radiologist recommendations. CT order entered

## 2018-11-03 NOTE — Telephone Encounter (Signed)
CT scheduled for 10/19 at 5:00pm, arrival 4:45pm, npo 4 hours prior, p/u oral contrast. Patient will also need BUN/CREAT. Patient aware he needs to go for this.

## 2018-11-03 NOTE — Telephone Encounter (Signed)
Noted. Pt is aware that he'll receive an appointment about his CT scan.

## 2018-11-03 NOTE — Telephone Encounter (Signed)
Please order CT can, order placed by EG.

## 2018-11-03 NOTE — Addendum Note (Signed)
Addended by: Cheron Every on: 11/03/2018 10:37 AM   Modules accepted: Orders

## 2018-11-12 ENCOUNTER — Other Ambulatory Visit: Payer: Self-pay | Admitting: Nurse Practitioner

## 2018-11-12 DIAGNOSIS — K760 Fatty (change of) liver, not elsewhere classified: Secondary | ICD-10-CM | POA: Diagnosis not present

## 2018-11-13 LAB — CREATININE, SERUM: Creat: 0.84 mg/dL (ref 0.60–1.35)

## 2018-11-13 LAB — BUN: BUN: 12 mg/dL (ref 7–25)

## 2018-11-17 ENCOUNTER — Other Ambulatory Visit: Payer: Self-pay

## 2018-11-17 ENCOUNTER — Ambulatory Visit (HOSPITAL_COMMUNITY)
Admission: RE | Admit: 2018-11-17 | Discharge: 2018-11-17 | Disposition: A | Discharge status: Home / Self Care | Payer: Medicare Other | Source: Ambulatory Visit | Attending: Nurse Practitioner | Admitting: Nurse Practitioner

## 2018-11-17 DIAGNOSIS — K760 Fatty (change of) liver, not elsewhere classified: Secondary | ICD-10-CM | POA: Insufficient documentation

## 2018-11-17 MED ORDER — IOHEXOL 300 MG/ML  SOLN
100.0000 mL | Freq: Once | INTRAMUSCULAR | Status: AC | PRN
  Administered 2018-11-17: 100 mL via INTRAVENOUS

## 2018-11-24 ENCOUNTER — Telehealth: Payer: Self-pay | Admitting: Internal Medicine

## 2018-11-24 NOTE — Telephone Encounter (Signed)
Pt called for his CT results, [phone]

## 2018-11-24 NOTE — Telephone Encounter (Signed)
Pt notified of his CT results. Pt wants to know why he has the abdominal pain he has. Pt states he was told that where is pain is, is where his liver is. Pt is taking Dicyclomine 4 times daily and Pantoprazole as directed. Pt feels like the pain comes and goes. When the pain comes, it feels like a sharp knife and someone is stabbing him per pt.

## 2018-11-24 NOTE — Telephone Encounter (Signed)
Lmom, waiting on a return call.  

## 2018-12-19 DIAGNOSIS — F902 Attention-deficit hyperactivity disorder, combined type: Secondary | ICD-10-CM | POA: Diagnosis not present

## 2018-12-19 DIAGNOSIS — F3132 Bipolar disorder, current episode depressed, moderate: Secondary | ICD-10-CM | POA: Diagnosis not present

## 2018-12-19 DIAGNOSIS — F4011 Social phobia, generalized: Secondary | ICD-10-CM | POA: Diagnosis not present

## 2018-12-30 ENCOUNTER — Ambulatory Visit (INDEPENDENT_AMBULATORY_CARE_PROVIDER_SITE_OTHER): Payer: Medicare Other | Admitting: Nurse Practitioner

## 2018-12-30 ENCOUNTER — Encounter: Payer: Self-pay | Admitting: Nurse Practitioner

## 2018-12-30 ENCOUNTER — Other Ambulatory Visit: Payer: Self-pay

## 2018-12-30 ENCOUNTER — Other Ambulatory Visit: Payer: Self-pay | Admitting: *Deleted

## 2018-12-30 VITALS — BP 120/74 | HR 82 | Temp 96.9°F | Ht 70.0 in | Wt 267.6 lb

## 2018-12-30 DIAGNOSIS — R109 Unspecified abdominal pain: Secondary | ICD-10-CM | POA: Insufficient documentation

## 2018-12-30 DIAGNOSIS — R1084 Generalized abdominal pain: Secondary | ICD-10-CM | POA: Diagnosis not present

## 2018-12-30 DIAGNOSIS — R197 Diarrhea, unspecified: Secondary | ICD-10-CM

## 2018-12-30 DIAGNOSIS — K760 Fatty (change of) liver, not elsewhere classified: Secondary | ICD-10-CM | POA: Diagnosis not present

## 2018-12-30 DIAGNOSIS — K219 Gastro-esophageal reflux disease without esophagitis: Secondary | ICD-10-CM

## 2018-12-30 NOTE — Assessment & Plan Note (Signed)
Initial ultrasound had query cirrhosis.  Follow-up CT found nonalcoholic fatty liver disease without sequela of cirrhosis.  Patient does have hypertension, significantly high cholesterol, obesity.  We discussed the hallmark treatments of fatty liver disease including diet and exercise.  I printed out significant patient information for him about fatty liver disease.  I discussed the need for diet and exercise.  We will refer him to a dietitian which she seems quite excited about in order to help him for a better diet related to fatty liver disease and overall health.  Thankfully his LFTs have all been normal in our system.  Follow-up in 4 months.

## 2018-12-30 NOTE — Patient Instructions (Addendum)
Your health issues we discussed today were:   Abdominal pain with diarrhea: 1. Take Bentyl up to 3 times a day. 2. You can take a fourth dose as needed for abdominal pain or worsening diarrhea 3. You can also use Imodium over-the-counter as needed for diarrhea 4. Call us if you have any worsening or severe symptoms  Fatty liver disease: 1. As we discussed, the hallmark of treatment for fatty liver disease is diet and exercise in order to properly control blood pressure, blood sugar, cholesterol, and weight. 2. We will refer you to a dietitian to help with diet for weight loss and overall health 3. I am printing out additional information for you related to fatty liver disease 4. Call us if you have any questions or problems related to this  Overall I recommend:  1. Continue your other current medications 2. Work with your primary care provider to get your cholesterol under control 3. Return for follow-up in 4 months 4. Call us if you have any questions or concerns.   Because of recent events of COVID-19 ("Coronavirus"), follow CDC recommendations:  Wash your hand frequently Avoid touching your face Stay away from people who are sick If you have symptoms such as fever, cough, shortness of breath then call your healthcare provider for further guidance If you are sick, STAY AT HOME unless otherwise directed by your healthcare provider. Follow directions from state and national officials regarding staying safe   At [facility] we value your feedback. You may receive a survey about your visit today. Please share your experience as we strive to create trusting relationships with our patients to provide genuine, compassionate, quality care.  We appreciate your understanding and patience as we review any laboratory studies, imaging, and other diagnostic tests that are ordered as we care for you. Our office policy is 5 business days for review of these results, and any  emergent or urgent results are addressed in a timely manner for your best interest. If you do not hear from our office in 1 week, please contact us.   We also encourage the use of MyChart, which contains your medical information for your review as well. If you are not enrolled in this feature, an access code is on this after visit summary for your convenience. Thank you for allowing [place] to be involved in your care.  It was great to see you today!  I hope you have a Merry Christmas and Happy Holidays!!        Fatty Liver Disease  Fatty liver disease occurs when too much fat has built up in your liver cells. Fatty liver disease is also called hepatic steatosis or steatohepatitis. The liver removes harmful substances from your bloodstream and produces fluids that your body needs. It also helps your body use and store energy from the food you eat. In many cases, fatty liver disease does not cause symptoms or problems. It is often diagnosed when tests are being done for other reasons. However, over time, fatty liver can cause inflammation that may lead to more serious liver problems, such as scarring of the liver (cirrhosis) and liver failure. Fatty liver is associated with insulin resistance, increased body fat, high blood pressure (hypertension), and high cholesterol. These are features of metabolic syndrome and increase your risk for stroke, diabetes, and heart disease. What are the causes? This condition may be caused by:  Drinking too much alcohol.  Poor nutrition.  Obesity.  Cushing's syndrome.  Diabetes.  High cholesterol.  Certain drugs.  Poisons.  Some viral infections.  Pregnancy. What increases the risk? You are more likely to develop this condition if you:  Abuse alcohol.  Are overweight.  Have diabetes.  Have hepatitis.  Have a high triglyceride level.  Are pregnant. What are the signs or symptoms? Fatty liver disease often does not cause symptoms. If  symptoms do develop, they can include:  Fatigue.  Weakness.  Weight loss.  Confusion.  Abdominal pain.  Nausea and vomiting.  Yellowing of your skin and the white parts of your eyes (jaundice).  Itchy skin. How is this diagnosed? This condition may be diagnosed by:  A physical exam and medical history.  Blood tests.  Imaging tests, such as an ultrasound, CT scan, or MRI.  A liver biopsy. A small sample of liver tissue is removed using a needle. The sample is then looked at under a microscope. How is this treated? Fatty liver disease is often caused by other health conditions. Treatment for fatty liver may involve medicines and lifestyle changes to manage conditions such as:  Alcoholism.  High cholesterol.  Diabetes.  Being overweight or obese. Follow these instructions at home:   Do not drink alcohol. If you have trouble quitting, ask your health care provider how to safely quit with the help of medicine or a supervised program. This is important to keep your condition from getting worse.  Eat a healthy diet as told by your health care provider. Ask your health care provider about working with a diet and nutrition specialist (dietitian) to develop an eating plan.  Exercise regularly. This can help you lose weight and control your cholesterol and diabetes. Talk to your health care provider about an exercise plan and which activities are best for you.  Take over-the-counter and prescription medicines only as told by your health care provider.  Keep all follow-up visits as told by your health care provider. This is important. Contact a health care provider if: You have trouble controlling your:  Blood sugar. This is especially important if you have diabetes.  Cholesterol.  Drinking of alcohol. Get help right away if:  You have abdominal pain.  You have jaundice.  You have nausea and vomiting.  You vomit blood or material that looks like coffee grounds.   You have stools that are black, tar-like, or bloody. Summary  Fatty liver disease develops when too much fat builds up in the cells of your liver.  Fatty liver disease often causes no symptoms or problems. However, over time, fatty liver can cause inflammation that may lead to more serious liver problems, such as scarring of the liver (cirrhosis).  You are more likely to develop this condition if you abuse alcohol, are pregnant, are overweight, have diabetes, have hepatitis, or have high triglyceride levels.  Contact your health care provider if you have trouble controlling your weight, blood sugar, cholesterol, or drinking of alcohol. This information is not intended to replace advice given to you by your health care provider. Make sure you discuss any questions you have with your health care provider. Document Released: 03/02/2005 Document Revised: 12/28/2016 Document Reviewed: 10/24/2016 Elsevier Patient Education  2020 [employer].

## 2018-12-30 NOTE — Progress Notes (Signed)
Referring Provider: Sinda Du, MD Primary Care Physician:  Sinda Du, MD Primary GI:  Dr. Gala Romney  Chief Complaint  Patient presents with  . Abdominal Pain    mid abd  . Diarrhea    "little bit"    HPI:   Bobby Jimenez is a 38 y.o. male who presents for follow-up on abdominal pain and diarrhea.  Patient was last seen in our office 10/22/2018 for GERD, diarrhea, left-sided abdominal pain.  History of GERD and possible IBS.  If you miss pantoprazole he will have recurrent heartburn and vomiting.  If he takes dicyclomine 3-4 times a day he will have improvement in abdominal cramping and 2 bowel movements that day.  No history of colonoscopy or endoscopy in our system.  Not currently due for either.  Has last visit noted GERD doing well on PPI.  IBS doing well on Bentyl.  Some worsening mid to lower abdominal pain/point tenderness on the left side of his umbilicus.  CT in 2018 had a tiny fat-containing umbilical hernia.  No improvement in pain with a bowel movement, described as constant and dull.  Denies heavy lifting.  No other overt GI complaints.  Recommended continue Bentyl, continue PPI.  Check abdominal ultrasound for point tenderness.  Follow-up in 2 months.  Abdominal U/S completed 10/24/2018 which found marked hepatic steatosis with mildly coarsened echotexture raising the question of background liver disease.  Suggested CT for further assessment of suspected hepatic lesion which could represent an area of fatty sparing artifact.  No hernia or other abnormality based on inspection with ultrasound in the area of concern.  CT completed 11/17/2018 which found hepatic steatosis, no focal lesion identified, no specific stigmata of hepatic cirrhosis, status post cholecystectomy.  Overall after CT imaging it appears he more likely has fatty liver disease and actual cirrhosis.  Today he states he's doing ok overall. Still with intermittent mid-abdominal pain about once a week,  varies based on diet (certain fruits and vegetables); pain is significant, sharp pain. Typically self-resolves after about an hour. No improvement with a bowel movement. Hasn't tried Bentyl episodically for this. Diarrhea doing pretty good, much less frequent. Taking Bentyl "as needed" but then states 4 times a day. Denies GERD symptoms, doing well with PPI. Denies N/V, hematochezia, melena, fever, chills, unintentional weight loss. Denies URI or flu-like symptoms. Denies loss of sense of taste or smell. Denies chest pain, dyspnea, dizziness, lightheadedness, syncope, near syncope. Denies any other upper or lower GI symptoms.  Has central obesity, hypertension, "real bad" cholesterol. Denies diabetes.  Past Medical History:  Diagnosis Date  . ADHD (attention deficit hyperactivity disorder)   . Anxiety   . Bipolar disorder ([place])   . Cervical radiculopathy   . Depression   . Dizziness   . Headache   . Hypertension   . Lightheaded   . Low back pain   . Vertigo     Past Surgical History:  Procedure Laterality Date  . CHOLECYSTECTOMY N/A 12/26/2016   Procedure: LAPAROSCOPIC CHOLECYSTECTOMY;  Surgeon: Aviva Signs, MD;  Location: [facility];  Service: General;  Laterality: N/A;  . KNEE SURGERY Right   . WISDOM TOOTH EXTRACTION      Current Outpatient Medications  Medication Sig Dispense Refill  . ABILIFY 10 MG tablet Take 5 mg by mouth 2 (two) times daily.     Marland Kitchen amantadine (SYMMETREL) 100 MG capsule Take 100 mg by mouth 2 (two) times daily.     . clonazePAM (KLONOPIN) 0.5  MG tablet Take 0.25-0.5 mg by mouth 2 (two) times daily. Takes 1/2 tablet in am and 1 tablet at bedtime    . dicyclomine (BENTYL) 10 MG capsule TAKE 1 CAPSULE BY MOUTH 4 TIMES DAILY WITH MEALS AND AT BEDTIME AS NEEDED. HOLD FOR CONSTIPATION. 120 capsule 5  . lisinopril (PRINIVIL,ZESTRIL) 10 MG tablet Take 10 mg by mouth daily.    . metoprolol tartrate (LOPRESSOR) 25 MG tablet Take 1 tablet (25 mg total) by mouth 2 (two)  times daily. 60 tablet 1  . Multiple Vitamin (MULTIVITAMIN) tablet Take 1 tablet by mouth daily.    . pantoprazole (PROTONIX) 40 MG tablet TAKE 1 TABLET BY MOUTH ONCE DAILY BEFORE BREAKFAST. 90 tablet 3  . rosuvastatin (CRESTOR) 10 MG tablet Take 1 tablet by mouth daily.    Marland Kitchen topiramate (TOPAMAX) 200 MG tablet Take 1 tablet by mouth 2 (two) times daily.    Marland Kitchen VASCEPA 1 g CAPS Take 2 capsules by mouth 2 (two) times daily.    Marland Kitchen ZOLOFT 100 MG tablet Take 150 mg by mouth daily.      No current facility-administered medications for this visit.     Allergies as of 12/30/2018 - Review Complete 12/30/2018  Allergen Reaction Noted  . Pineapple Shortness Of Breath and Swelling 05/14/2014  . Latex Swelling 05/14/2014  . Morphine and related Hives and Itching 05/14/2014  . Statins  12/30/2018    Family History  Adopted: Yes  Problem Relation Age of Onset  . High blood pressure Mother   . Seizures Mother   . Colon cancer Neg Hx     Social History   Socioeconomic History  . Marital status: Divorced    Spouse name: Not on file  . Number of children: 0  . Years of education: 32  . Highest education level: Not on file  Occupational History  . Not on file  Social Needs  . Financial resource strain: Not on file  . Food insecurity    Worry: Not on file    Inability: Not on file  . Transportation needs    Medical: Not on file    Non-medical: Not on file  Tobacco Use  . Smoking status: Former Smoker    Packs/day: 1.00    Types: Cigarettes  . Smokeless tobacco: Former Neurosurgeon  . Tobacco comment: quit Nov 2018  Substance and Sexual Activity  . Alcohol use: Yes    Alcohol/week: 1.0 standard drinks    Types: 1 Cans of beer per week    Comment: heavy in past, short period of time in 20s.; 12/30/18 occasional about 1 beer a month  . Drug use: No    Comment: Quit 1996  . Sexual activity: Not on file  Lifestyle  . Physical activity    Days per week: Not on file    Minutes per session: Not  on file  . Stress: Not on file  Relationships  . Social Musician on phone: Not on file    Gets together: Not on file    Attends religious service: Not on file    Active member of club or organization: Not on file    Attends meetings of clubs or organizations: Not on file    Relationship status: Not on file  Other Topics Concern  . Not on file  Social History Narrative   Patient lives at home with his father and unemployed.   Education some college.   Both handed.   Caffeine three  cups daily soda and coffee.    Review of Systems: General: Negative for anorexia, weight loss, fever, chills, fatigue, weakness. ENT: Negative for hoarseness, difficulty swallowing. CV: Negative for chest pain, angina, palpitations, peripheral edema.  Respiratory: Negative for dyspnea at rest, cough, sputum, wheezing.  GI: See history of present illness. Endo: Negative for unusual weight change.  Heme: Negative for bruising or bleeding. Allergy: Negative for rash or hives.   Physical Exam: BP 120/74   Pulse 82   Temp (!) 96.9 F (36.1 C) (Temporal)   Ht 5\' 10"  (1.778 m)   Wt 267 lb 9.6 oz (121.4 kg)   BMI 38.40 kg/m  General:   Alert and oriented. Pleasant and cooperative. Well-nourished and well-developed.  Eyes:  Without icterus, sclera clear and conjunctiva pink.  Ears:  Normal auditory acuity. Cardiovascular:  S1, S2 present without murmurs appreciated. Extremities without clubbing or edema. Respiratory:  Clear to auscultation bilaterally. No wheezes, rales, or rhonchi. No distress.  Gastrointestinal:  +BS, soft, non-tender and non-distended. No HSM noted. No guarding or rebound. No masses appreciated.  Rectal:  Deferred  Musculoskalatal:  Symmetrical without gross deformities. Neurologic:  Alert and oriented x4;  grossly normal neurologically. Psych:  Alert and cooperative. Normal mood and affect. Heme/Lymph/Immune: No excessive bruising noted.    12/30/2018 3:19 PM    Disclaimer: This note was dictated with voice recognition software. Similar sounding words can inadvertently be transcribed and may not be corrected upon review.

## 2018-12-30 NOTE — Assessment & Plan Note (Signed)
GERD significantly improved, essentially resolved on PPI.  Recommend he continue his current medications and follow-up as needed.

## 2018-12-30 NOTE — Assessment & Plan Note (Signed)
Improved but somewhat persistent abdominal pain.  This occurs about once a week and lasts about an hour.  He is currently taking Bentyl 4 times a day scheduled.  I recommended reducing this to 3 times a day before meals.  It seems his abdominal pain is triggered by eating especially specific foods.  Recommended trigger avoidance.  Recommend he take the Bentyl if he has abdominal pain to see if this helps.  Follow-up in 4 months.

## 2018-12-30 NOTE — Assessment & Plan Note (Signed)
Diarrhea significantly improved although still has rare episodes.  He is taking Bentyl 4 times a day.  I recommended he cut back to 3 times a day to see if this helps.  This will leave a 4th dose a day as needed for abdominal pain as per below.  He can also use Imodium as needed.  Call for any worsening symptoms and follow-up in 4 months.

## 2018-12-31 ENCOUNTER — Encounter: Payer: Self-pay | Admitting: Internal Medicine

## 2019-01-05 ENCOUNTER — Encounter: Payer: Self-pay | Admitting: Family Medicine

## 2019-01-07 ENCOUNTER — Ambulatory Visit (INDEPENDENT_AMBULATORY_CARE_PROVIDER_SITE_OTHER): Payer: Medicare Other | Admitting: Family Medicine

## 2019-01-07 ENCOUNTER — Encounter: Payer: Self-pay | Admitting: Family Medicine

## 2019-01-07 ENCOUNTER — Other Ambulatory Visit: Payer: Self-pay

## 2019-01-07 VITALS — BP 108/71 | HR 77 | Temp 98.3°F | Resp 16 | Ht 70.0 in | Wt 267.2 lb

## 2019-01-07 DIAGNOSIS — E7800 Pure hypercholesterolemia, unspecified: Secondary | ICD-10-CM | POA: Diagnosis not present

## 2019-01-07 DIAGNOSIS — E669 Obesity, unspecified: Secondary | ICD-10-CM | POA: Diagnosis not present

## 2019-01-07 DIAGNOSIS — Z0000 Encounter for general adult medical examination without abnormal findings: Secondary | ICD-10-CM

## 2019-01-07 DIAGNOSIS — K219 Gastro-esophageal reflux disease without esophagitis: Secondary | ICD-10-CM | POA: Diagnosis not present

## 2019-01-07 DIAGNOSIS — F3170 Bipolar disorder, currently in remission, most recent episode unspecified: Secondary | ICD-10-CM

## 2019-01-07 DIAGNOSIS — I10 Essential (primary) hypertension: Secondary | ICD-10-CM

## 2019-01-07 NOTE — Patient Instructions (Signed)

## 2019-01-07 NOTE — Progress Notes (Addendum)
Office Note 01/07/2019  CC:  Chief Complaint  Patient presents with  . Establish Care    Previous PCP, Dr.Hawkins    HPI:  Bobby Jimenez is a 38 y.o. white male who is here to establish care. Patient's most recent primary MD: Dr. [name] (retired). Old records in [facility] EMR were reviewed prior to or during today's visit.  On disability for psych reasons, is followed [facility], Dr. Jannifer FranklinAkintayo.   PMP AWARE reviewed today. Most recent clonaz rx filled 01/05/19, #45, rxd by Dr. Thedore MinsMojeed Akintayo. No red flags.  HTN: bp checks at MD visits fine per pt. No home bp checks.  Pt concerned about cholesterol, high trigs. Hx of statin intol (urinary retention) but tolerates crestor 20mg  qd. Due for repeat FLP. Diet: tries to avoid high fat foods, increased fruits and veggies. Drinks lots of sweet toe but no colas.  He has seen a dietitian in the past.  GERD : well controlled on daily pantoprazole x 2+ yrs, bad sxs if he misses 1-2 doses.  Past Medical History:  Diagnosis Date  . ADHD (attention deficit hyperactivity disorder)   . Anxiety   . Bipolar disorder (HCC)   . Cervical radiculopathy   . Dizziness   . Food allergy    pineapple  . GERD (gastroesophageal reflux disease)   . Headache   . Hepatic steatosis    u/s and CT confirmed 2020--no cirrhosis.  . Hypercholesterolemia   . Hypertension   . IBS (irritable bowel syndrome)   . Latex allergy   . Lightheaded   . Low back pain   . OSA (obstructive sleep apnea) 10/16/2016   cpap recommended (Dr. Lestine Mountoonqhah)  . Tremors of nervous system    arms: psych has him on amantadine for this.  . Vertigo     Past Surgical History:  Procedure Laterality Date  . CHOLECYSTECTOMY N/A 12/26/2016   Procedure: LAPAROSCOPIC CHOLECYSTECTOMY;  Surgeon: Franky MachoJenkins, Mark, MD;  Location: [facility];  Service: General;  Laterality: N/A;  . KNEE SURGERY Right 1997   arthroscopic  . polysomnogram  09/2016   Mild/mod OSA, CPAP recommended  (Dr. Lestine Mountoonqhah)  . WISDOM TOOTH EXTRACTION  1997    Family History  Adopted: Yes  Problem Relation Age of Onset  . High blood pressure Mother   . Seizures Mother   . Colon cancer Neg Hx     Social History   Socioeconomic History  . Marital status: Divorced    Spouse name: Not on file  . Number of children: 0  . Years of education: 2513  . Highest education level: Not on file  Occupational History  . Not on file  Social Needs  . Financial resource strain: Not on file  . Food insecurity    Worry: Not on file    Inability: Not on file  . Transportation needs    Medical: Not on file    Non-medical: Not on file  Tobacco Use  . Smoking status: Former Smoker    Packs/day: 1.00    Types: Cigarettes  . Smokeless tobacco: Former NeurosurgeonUser  . Tobacco comment: quit Nov 2018  Substance and Sexual Activity  . Alcohol use: Yes    Alcohol/week: 1.0 standard drinks    Types: 1 Cans of beer per week    Comment: heavy in past, short period of time in 20s.; 12/30/18 occasional about 1 beer a month  . Drug use: No    Comment: Quit 1996  . Sexual activity: Not  on file  Lifestyle  . Physical activity    Days per week: Not on file    Minutes per session: Not on file  . Stress: Not on file  Relationships  . Social Musician on phone: Not on file    Gets together: Not on file    Attends religious service: Not on file    Active member of club or organization: Not on file    Attends meetings of clubs or organizations: Not on file    Relationship status: Not on file  . Intimate partner violence    Fear of current or ex partner: Not on file    Emotionally abused: Not on file    Physically abused: Not on file    Forced sexual activity: Not on file  Other Topics Concern  . Not on file  Social History Narrative   Divorced, no children.   Educ: college at [employer]   Patient lives at home with his father and unemployed.  Disability for psych reasons.   Education some college.   Both  handed.   Caffeine three cups daily soda and coffee.   Alc: occ beer (remote hx of alc abuse, marijuana, acid--no IV drugs) in 2005-2006, surround his problems coping with GF's death.   Tobacco: former cig smoker, 20 pack-yr hx , quit 2018.    Outpatient Encounter Medications as of 01/07/2019  Medication Sig  . ABILIFY 10 MG tablet Take 5 mg by mouth 2 (two) times daily.   Marland Kitchen amantadine (SYMMETREL) 100 MG capsule Take 100 mg by mouth 2 (two) times daily.   . clonazePAM (KLONOPIN) 0.5 MG tablet Take 0.25-0.5 mg by mouth 2 (two) times daily. Takes 1/2 tablet in am and 1 tablet at bedtime  . dicyclomine (BENTYL) 10 MG capsule TAKE 1 CAPSULE BY MOUTH 4 TIMES DAILY WITH MEALS AND AT BEDTIME AS NEEDED. HOLD FOR CONSTIPATION.  Marland Kitchen lisinopril (PRINIVIL,ZESTRIL) 10 MG tablet Take 10 mg by mouth daily.  . metoprolol tartrate (LOPRESSOR) 25 MG tablet Take 1 tablet (25 mg total) by mouth 2 (two) times daily.  . Multiple Vitamin (MULTIVITAMIN) tablet Take 1 tablet by mouth daily.  . pantoprazole (PROTONIX) 40 MG tablet TAKE 1 TABLET BY MOUTH ONCE DAILY BEFORE BREAKFAST.  . rosuvastatin (CRESTOR) 10 MG tablet Take 1 tablet by mouth daily.  Marland Kitchen topiramate (TOPAMAX) 200 MG tablet Take 1 tablet by mouth 2 (two) times daily.  Marland Kitchen VASCEPA 1 g CAPS Take 2 capsules by mouth 2 (two) times daily.  Marland Kitchen ZOLOFT 100 MG tablet Take 150 mg by mouth daily.    No facility-administered encounter medications on file as of 01/07/2019.     Allergies  Allergen Reactions  . Pineapple Shortness Of Breath and Swelling  . Latex Swelling  . Morphine And Related Hives and Itching  . Statins     "locks urinary tract up"    ROS Review of Systems  Constitutional: Negative for appetite change, chills, fatigue and fever.  HENT: Negative for congestion, dental problem, ear pain and sore throat.   Eyes: Negative for discharge, redness and visual disturbance.  Respiratory: Negative for cough, chest tightness, shortness of breath and  wheezing.   Cardiovascular: Negative for chest pain, palpitations and leg swelling.  Gastrointestinal: Negative for abdominal pain, blood in stool, diarrhea, nausea and vomiting.  Genitourinary: Negative for difficulty urinating, dysuria, flank pain, frequency, hematuria and urgency.  Musculoskeletal: Negative for arthralgias, back pain, joint swelling, myalgias and neck stiffness.  Skin:  Negative for pallor and rash.  Neurological: Negative for dizziness, speech difficulty, weakness and headaches.  Hematological: Negative for adenopathy. Does not bruise/bleed easily.  Psychiatric/Behavioral: Negative for confusion and sleep disturbance. The patient is not nervous/anxious.     PE; Blood pressure 108/71, pulse 77, temperature 98.3 F (36.8 C), temperature source Temporal, resp. rate 16, height  (1.778 m), weight 267 lb 3.2 oz (121.2 kg), SpO2 98 %. Body mass index is 38.34 kg/m.  Gen: Alert, well appearing.  Patient is oriented to person, place, time, and situation. AFFECT: pleasant, lucid thought and speech. ENT: Ears: EACs clear, normal epithelium.  TMs with good light reflex and landmarks bilaterally.  Eyes: no injection, icteris, swelling, or exudate.  EOMI, PERRLA. Nose: no drainage or turbinate edema/swelling.  No injection or focal lesion.  Mouth: lips without lesion/swelling.  Oral mucosa pink and moist.  Dentition intact and without obvious caries or gingival swelling.  Oropharynx without erythema, exudate, or swelling.  Neck: supple/nontender.  No LAD, mass, or TM.  Carotid pulses 2+ bilaterally, without bruits. CV: RRR, no m/r/g.   LUNGS: CTA bilat, nonlabored resps, good aeration in all lung fields. ABD: soft, NT, ND, BS normal.  No hepatospenomegaly or mass.  No bruits. EXT: no clubbing, cyanosis, or edema.  Musculoskeletal: no joint swelling, erythema, warmth, or tenderness.  ROM of all joints intact. Skin - no sores or suspicious lesions or rashes or color  changes   Pertinent labs:  No results found for: TSH Lab Results  Component Value Date   WBC 12.4 (H) 12/27/2016   HGB 9.8 (L) 12/27/2016   HCT 29.4 (L) 12/27/2016   MCV 93.6 12/27/2016   PLT 249 12/27/2016  No results found for: IRON, TIBC, FERRITIN No results found for: VITAMINB12  Lab Results  Component Value Date   CREATININE 0.84 11/12/2018   BUN 12 11/12/2018   NA 139 12/27/2016   K 3.5 12/27/2016   CL 109 12/27/2016   CO2 22 12/27/2016   Lab Results  Component Value Date   ALT 51 (A) 10/29/2017   AST 33 10/29/2017   ALKPHOS 65 10/29/2017   BILITOT 0.5 12/27/2016   Lab Results  Component Value Date   CHOL 224 (A) 01/21/2018   Lab Results  Component Value Date   HDL 32 (A) 01/21/2018   No results found for: [facility] Lab Results  Component Value Date   TRIG 745 (A) 01/21/2018   No results found for: CHOLHDL   No results found for: HGBA1C   ASSESSMENT AND PLAN:   New pt, old records in EMR.  1) HTN: The current medical regimen is effective;  continue present plan and medications. Lytes/cr--future.  2) HLD: poorly controlled trigs, tolerating vascepa 2 mg bid and crestor  qd. FLP and hepatic panel repeat and then we'll decide on any changes in treatment. May need to see [facility] cardiology lipid clinic.  3) GERD: stable on daily PPI long term.  Continue to try to eat GERD-friendly diet.  4) Bipolar d/o, anxiety, hx of tremors from lithium: all fairly well controlled and managed by psychiatrist.  5) Health maintenance exam: Reviewed age and gender appropriate health maintenance issues (prudent diet, regular exercise, health risks of tobacco and excessive alcohol, use of seatbelts, fire alarms in home, use of sunscreen).  Also reviewed age and gender appropriate health screening as well as vaccine recommendations. Vaccines: Declined flu vaccine, Tdap UTD 2015, pneumovax given while in hosp for his gallbladder surgery 11/2016. Labs: return for  fasting  HP labs. An After Visit Summary was printed and given to the patient.  Return in about 6 months (around 07/08/2019) for routine chronic illness f/u.  Signed:  Crissie Sickles, MD           01/07/2019

## 2019-01-09 ENCOUNTER — Ambulatory Visit (INDEPENDENT_AMBULATORY_CARE_PROVIDER_SITE_OTHER): Payer: Medicare Other | Admitting: Family Medicine

## 2019-01-09 ENCOUNTER — Other Ambulatory Visit: Payer: Self-pay

## 2019-01-09 DIAGNOSIS — I10 Essential (primary) hypertension: Secondary | ICD-10-CM

## 2019-01-09 DIAGNOSIS — E7800 Pure hypercholesterolemia, unspecified: Secondary | ICD-10-CM | POA: Diagnosis not present

## 2019-01-09 LAB — LIPID PANEL
Cholesterol: 168 mg/dL (ref 0–200)
HDL: 36.4 mg/dL — ABNORMAL LOW (ref >39.00)
Total CHOL/HDL Ratio: 5
Triglycerides: 581 mg/dL — ABNORMAL HIGH (ref 0.0–149.0)

## 2019-01-09 LAB — CBC WITH DIFFERENTIAL/PLATELET
Basophils Absolute: 0 10*3/uL (ref 0.0–0.1)
Basophils Relative: 0.7 % (ref 0.0–3.0)
Eosinophils Absolute: 0.1 10*3/uL (ref 0.0–0.7)
Eosinophils Relative: 2.2 % (ref 0.0–5.0)
HCT: 41.1 % (ref 39.0–52.0)
Hemoglobin: 13.9 g/dL (ref 13.0–17.0)
Lymphocytes Relative: 28.6 % (ref 12.0–46.0)
Lymphs Abs: 1.9 10*3/uL (ref 0.7–4.0)
MCHC: 33.7 g/dL (ref 30.0–36.0)
MCV: 93.6 fl (ref 78.0–100.0)
Monocytes Absolute: 0.4 10*3/uL (ref 0.1–1.0)
Monocytes Relative: 6.2 % (ref 3.0–12.0)
Neutro Abs: 4.2 10*3/uL (ref 1.4–7.7)
Neutrophils Relative %: 62.3 % (ref 43.0–77.0)
Platelets: 224 10*3/uL (ref 150.0–400.0)
RBC: 4.39 Mil/uL (ref 4.22–5.81)
RDW: 14.5 % (ref 11.5–15.5)
WBC: 6.7 10*3/uL (ref 4.0–10.5)

## 2019-01-09 LAB — COMPREHENSIVE METABOLIC PANEL
ALT: 37 U/L (ref 0–53)
AST: 21 U/L (ref 0–37)
Albumin: 4.5 g/dL (ref 3.5–5.2)
Alkaline Phosphatase: 77 U/L (ref 39–117)
BUN: 17 mg/dL (ref 6–23)
CO2: 24 mEq/L (ref 19–32)
Calcium: 9.4 mg/dL (ref 8.4–10.5)
Chloride: 106 mEq/L (ref 96–112)
Creatinine, Ser: 0.87 mg/dL (ref 0.40–1.50)
GFR: 97.2 mL/min (ref >60.00)
Glucose, Bld: 96 mg/dL (ref 70–99)
Potassium: 4 mEq/L (ref 3.5–5.1)
Sodium: 139 mEq/L (ref 135–145)
Total Bilirubin: 0.4 mg/dL (ref 0.2–1.2)
Total Protein: 7.1 g/dL (ref 6.0–8.3)

## 2019-01-09 LAB — LDL CHOLESTEROL, DIRECT: Direct LDL: 66 mg/dL

## 2019-01-09 LAB — TSH: TSH: 2.58 u[IU]/mL (ref 0.35–4.50)

## 2019-01-13 ENCOUNTER — Other Ambulatory Visit: Payer: Self-pay | Admitting: Family Medicine

## 2019-01-13 MED ORDER — ROSUVASTATIN CALCIUM 20 MG PO TABS: 20.0000 mg | ORAL_TABLET | Freq: Every day | ORAL | 1 refills | Status: DC

## 2019-02-23 ENCOUNTER — Encounter: Payer: Medicare Other | Attending: Nurse Practitioner | Admitting: Nutrition

## 2019-02-23 ENCOUNTER — Other Ambulatory Visit: Payer: Self-pay

## 2019-02-23 ENCOUNTER — Encounter: Payer: Self-pay | Admitting: Nutrition

## 2019-02-23 DIAGNOSIS — E669 Obesity, unspecified: Secondary | ICD-10-CM

## 2019-02-23 DIAGNOSIS — I10 Essential (primary) hypertension: Secondary | ICD-10-CM

## 2019-02-23 DIAGNOSIS — K760 Fatty (change of) liver, not elsewhere classified: Secondary | ICD-10-CM

## 2019-02-23 DIAGNOSIS — E782 Mixed hyperlipidemia: Secondary | ICD-10-CM

## 2019-02-23 NOTE — Progress Notes (Signed)
  Medical Nutrition Therapy:  Appt start time: 1315 end time:  1415.   Assessment:  Primary concerns today: Obesity. PMH: HTN, Hyperlipidemia, Sleep apnea, Anxiety and GERD.Marland Kitchen He wants to lose weight and feel better. HE typically eats 1 meal per day. Not working. No schedule for meals. Drinks a lot of sweet tea. Elevated TG's. Doesn't eat a lot of sweets but does eat processed foods and lots of sweet tea. Willing to change diet and exercise.  CMP Latest Ref Rng & Units 01/09/2019 11/12/2018 10/29/2017  Glucose 70 - 99 mg/dL 96 - -  BUN 6 - 23 mg/dL 17 12 -  Creatinine 4.19 - 1.50 mg/dL 3.79 0.24 -  Sodium 097 - 145 mEq/L 139 - -  Potassium 3.5 - 5.1 mEq/L 4.0 - -  Chloride 96 - 112 mEq/L 106 - -  CO2 19 - 32 mEq/L 24 - -  Calcium 8.4 - 10.5 mg/dL 9.4 - -  Total Protein 6.0 - 8.3 g/dL 7.1 - -  Total Bilirubin 0.2 - 1.2 mg/dL 0.4 - -  Alkaline Phos 39 - 117 U/L 77 - 65  AST 0 - 37 U/L 21 - 33  ALT 0 - 53 U/L 37 - 51(A)   Lipid Panel     Component Value Date/Time   CHOL 168 01/09/2019 0939   TRIG (H) 01/09/2019 0939    581.0 Triglyceride is over 400; calculations on Lipids are invalid.   HDL 36.40 (L) 01/09/2019 0939   CHOLHDL 5 01/09/2019 0939   LDLDIRECT 66.0 01/09/2019 0939     Preferred Learning Style:  No preference indicated   Learning Readiness:    Change in progress   MEDICATIONS:    DIETARY INTAKE:     24-hr recall:  B ( AM): skipped Snk ( AM):   L ( PM): 12 oz 2% milk               [place] steak with gravy and rice, broccoli and cheese, mushrooms, cornbread, sweet tea Snk ( PM):  D ( PM): skipped Snk ( PM): water 20 Beverages: sweet tea, water.  Usual physical activity:  ADL,   Estimated energy needs: 1800 calories 200 g carbohydrates 135 g protein 50 g fat  Progress Towards Goal(s):  In progress.   Nutritional Diagnosis:  NB-1.1 Food and nutrition-related knowledge deficit As related to Obesity, hyperlipidemia.  As evidenced by BMI > 30  and TG > 500.    Intervention Weight loss education and Hyperlipidemia education provided on My Plate, CHO counting, meal planning, portion sizes, timing of meals, avoiding snacks between meals, ttaking medications as prescribed, benefits of exercising 60 minutes per day and prevention of DM. Cut out sweet tea and processed foods.Goals  Follow My Plate Cut out sweet tea Exercise 60 minutes three times per week Eat meals a day Don't skip meals Drink only water Increase fresh fruits and vegetables. Lose 1-2 lbs per week  .  Teaching Method Utilized:  Visual Auditory Hands on  Handouts given during visit include:  The Plate Method   Meal Plan Card  Weight loss tips.     Barriers to learning/adherence to lifestyle change: none  Demonstrated degree of understanding via:  Teach Back   Monitoring/Evaluation:  Dietary intake, exercise,and body weight 1 month. Marland Kitchen

## 2019-02-23 NOTE — Patient Instructions (Signed)
Goals  Follow My Plate Cut out sweet tea Exercise 60 minutes three times per week Eat meals a day Don't skip meals Drink only water Increase fresh fruits and vegetables. Lose 1-2 lbs per week

## 2019-03-10 ENCOUNTER — Other Ambulatory Visit: Payer: Self-pay | Admitting: Gastroenterology

## 2019-03-11 ENCOUNTER — Ambulatory Visit: Payer: Medicare Other | Attending: Internal Medicine

## 2019-03-11 ENCOUNTER — Ambulatory Visit (INDEPENDENT_AMBULATORY_CARE_PROVIDER_SITE_OTHER): Payer: Medicare Other | Admitting: Family Medicine

## 2019-03-11 ENCOUNTER — Other Ambulatory Visit: Payer: Self-pay

## 2019-03-11 ENCOUNTER — Encounter: Payer: Self-pay | Admitting: Family Medicine

## 2019-03-11 VITALS — Ht 70.0 in

## 2019-03-11 DIAGNOSIS — R059 Cough, unspecified: Secondary | ICD-10-CM

## 2019-03-11 DIAGNOSIS — J029 Acute pharyngitis, unspecified: Secondary | ICD-10-CM

## 2019-03-11 DIAGNOSIS — R05 Cough: Secondary | ICD-10-CM

## 2019-03-11 DIAGNOSIS — Z20822 Contact with and (suspected) exposure to covid-19: Secondary | ICD-10-CM | POA: Diagnosis not present

## 2019-03-11 NOTE — Patient Instructions (Signed)
Rest, hydrate.  Start OTC flonase, mucinex (DM if cough) and  nasal saline.  Start daily antihistamine of choice (Zyrtec, Allegra etc.) - Novel Coronavirus, NAA ([facility]) - isolate until test results are available.    -If positive test, continue self-isolation until Thursday, February 18.  At that time if symptoms are improving and has remained fever free for greater than 72 hours  can end isolation.  -If Covid test is negative, continue above recommendations on OTC medications and follow-up in 7 to 10 days if symptoms are not improving or worsening.  At that time would consider treating for sinusitis/pharyngitis.   COVID-19 COVID-19 is a respiratory infection that is caused by a virus called severe acute respiratory syndrome coronavirus 2 (SARS-CoV-2). The disease is also known as coronavirus disease or novel coronavirus. In some people, the virus may not cause any symptoms. In others, it may cause a serious infection. The infection can get worse quickly and can lead to complications, such as:  Pneumonia, or infection of the lungs.  Acute respiratory distress syndrome or ARDS. This is a condition in which fluid build-up in the lungs prevents the lungs from filling with air and passing oxygen into the blood.  Acute respiratory failure. This is a condition in which there is not enough oxygen passing from the lungs to the body or when carbon dioxide is not passing from the lungs out of the body.  Sepsis or septic shock. This is a serious bodily reaction to an infection.  Blood clotting problems.  Secondary infections due to bacteria or fungus.  Organ failure. This is when your body's organs stop working. The virus that causes COVID-19 is contagious. This means that it can spread from person to person through droplets from coughs and sneezes (respiratory secretions). What are the causes? This illness is caused by a virus. You may catch the virus by:  Breathing in droplets from an infected  person. Droplets can be spread by a person breathing, speaking, singing, coughing, or sneezing.  Touching something, like a table or a doorknob, that was exposed to the virus (contaminated) and then touching your mouth, nose, or eyes. What increases the risk? Risk for infection You are more likely to be infected with this virus if you:  Are within 6 feet (2 meters) of a person with COVID-19.  Provide care for or live with a person who is infected with COVID-19.  Spend time in crowded indoor spaces or live in shared housing. Risk for serious illness You are more likely to become seriously ill from the virus if you:  Are 56 years of age or older. The higher your age, the more you are at risk for serious illness.  Live in a nursing home or long-term care facility.  Have cancer.  Have a long-term (chronic) disease such as: ? Chronic lung disease, including chronic obstructive pulmonary disease or asthma. ? A long-term disease that lowers your body's ability to fight infection (immunocompromised). ? Heart disease, including heart failure, a condition in which the arteries that lead to the heart become narrow or blocked (coronary artery disease), a disease which makes the heart muscle thick, weak, or stiff (cardiomyopathy). ? Diabetes. ? Chronic kidney disease. ? Sickle cell disease, a condition in which red blood cells have an abnormal "sickle" shape. ? Liver disease.  Are obese. What are the signs or symptoms? Symptoms of this condition can range from mild to severe. Symptoms may appear any time from 2 to 14 days after being exposed  to the virus. They include:  A fever or chills.  A cough.  Difficulty breathing.  Headaches, body aches, or muscle aches.  Runny or stuffy (congested) nose.  A sore throat.  New loss of taste or smell. Some people may also have stomach problems, such as nausea, vomiting, or diarrhea. Other people may not have any symptoms of COVID-19. How is  this diagnosed? This condition may be diagnosed based on:  Your signs and symptoms, especially if: ? You live in an area with a COVID-19 outbreak. ? You recently traveled to or from an area where the virus is common. ? You provide care for or live with a person who was diagnosed with COVID-19. ? You were exposed to a person who was diagnosed with COVID-19.  A physical exam.  Lab tests, which may include: ? Taking a sample of fluid from the back of your nose and throat (nasopharyngeal fluid), your nose, or your throat using a swab. ? A sample of mucus from your lungs (sputum). ? Blood tests.  Imaging tests, which may include, X-rays, CT scan, or ultrasound. How is this treated? At present, there is no medicine to treat COVID-19. Medicines that treat other diseases are being used on a trial basis to see if they are effective against COVID-19. Your health care provider will talk with you about ways to treat your symptoms. For most people, the infection is mild and can be managed at home with rest, fluids, and over-the-counter medicines. Treatment for a serious infection usually takes places in a hospital intensive care unit (ICU). It may include one or more of the following treatments. These treatments are given until your symptoms improve.  Receiving fluids and medicines through an IV.  Supplemental oxygen. Extra oxygen is given through a tube in the nose, a face mask, or a hood.  Positioning you to lie on your stomach (prone position). This makes it easier for oxygen to get into the lungs.  Continuous positive airway pressure (CPAP) or bi-level positive airway pressure (BPAP) machine. This treatment uses mild air pressure to keep the airways open. A tube that is connected to a motor delivers oxygen to the body.  Ventilator. This treatment moves air into and out of the lungs by using a tube that is placed in your windpipe.  Tracheostomy. This is a procedure to create a hole in the neck  so that a breathing tube can be inserted.  Extracorporeal membrane oxygenation (ECMO). This procedure gives the lungs a chance to recover by taking over the functions of the heart and lungs. It supplies oxygen to the body and removes carbon dioxide. Follow these instructions at home: Lifestyle  If you are sick, stay home except to get medical care. Your health care provider will tell you how long to stay home. Call your health care provider before you go for medical care.  Rest at home as told by your health care provider.  Do not use any products that contain nicotine or tobacco, such as cigarettes, e-cigarettes, and chewing tobacco. If you need help quitting, ask your health care provider.  Return to your normal activities as told by your health care provider. Ask your health care provider what activities are safe for you. General instructions  Take over-the-counter and prescription medicines only as told by your health care provider.  Drink enough fluid to keep your urine pale yellow.  Keep all follow-up visits as told by your health care provider. This is important. How is this prevented?  There is no vaccine to help prevent COVID-19 infection. However, there are steps you can take to protect yourself and others from this virus. To protect yourself:   Do not travel to areas where COVID-19 is a risk. The areas where COVID-19 is reported change often. To identify high-risk areas and travel restrictions, check the CDC travel website: FatFares.com.br  If you live in, or must travel to, an area where COVID-19 is a risk, take precautions to avoid infection. ? Stay away from people who are sick. ? Wash your hands often with soap and water for 20 seconds. If soap and water are not available, use an alcohol-based hand sanitizer. ? Avoid touching your mouth, face, eyes, or nose. ? Avoid going out in public, follow guidance from your state and local health authorities. ? If you  must go out in public, wear a cloth face covering or face mask. Make sure your mask covers your nose and mouth. ? Avoid crowded indoor spaces. Stay at least 6 feet (2 meters) away from others. ? Disinfect objects and surfaces that are frequently touched every day. This may include:  Counters and tables.  Doorknobs and light switches.  Sinks and faucets.  Electronics, such as phones, remote controls, keyboards, computers, and tablets. To protect others: If you have symptoms of COVID-19, take steps to prevent the virus from spreading to others.  If you think you have a COVID-19 infection, contact your health care provider right away. Tell your health care team that you think you may have a COVID-19 infection.  Stay home. Leave your house only to seek medical care. Do not use public transport.  Do not travel while you are sick.  Wash your hands often with soap and water for 20 seconds. If soap and water are not available, use alcohol-based hand sanitizer.  Stay away from other members of your household. Let healthy household members care for children and pets, if possible. If you have to care for children or pets, wash your hands often and wear a mask. If possible, stay in your own room, separate from others. Use a different bathroom.  Make sure that all people in your household wash their hands well and often.  Cough or sneeze into a tissue or your sleeve or elbow. Do not cough or sneeze into your hand or into the air.  Wear a cloth face covering or face mask. Make sure your mask covers your nose and mouth. Where to find more information  Centers for Disease Control and Prevention: PurpleGadgets.be  World Health Organization: https://www.castaneda.info/ Contact a health care provider if:  You live in or have traveled to an area where COVID-19 is a risk and you have symptoms of the infection.  You have had contact with someone who has COVID-19  and you have symptoms of the infection. Get help right away if:  You have trouble breathing.  You have pain or pressure in your chest.  You have confusion.  You have bluish lips and fingernails.  You have difficulty waking from sleep.  You have symptoms that get worse. These symptoms may represent a serious problem that is an emergency. Do not wait to see if the symptoms will go away. Get medical help right away. Call your local emergency services (911 in the [place] Do not drive yourself to the hospital. Let the emergency medical personnel know if you think you have COVID-19. Summary  COVID-19 is a respiratory infection that is caused by a virus. It is also known  as coronavirus disease or novel coronavirus. It can cause serious infections, such as pneumonia, acute respiratory distress syndrome, acute respiratory failure, or sepsis.  The virus that causes COVID-19 is contagious. This means that it can spread from person to person through droplets from breathing, speaking, singing, coughing, or sneezing.  You are more likely to develop a serious illness if you are 46 years of age or older, have a weak immune system, live in a nursing home, or have chronic disease.  There is no medicine to treat COVID-19. Your health care provider will talk with you about ways to treat your symptoms.  Take steps to protect yourself and others from infection. Wash your hands often and disinfect objects and surfaces that are frequently touched every day. Stay away from people who are sick and wear a mask if you are sick. This information is not intended to replace advice given to you by your health care provider. Make sure you discuss any questions you have with your health care provider. Document Revised: 11/14/2018 Document Reviewed: 02/20/2018 Elsevier Patient Education  2020 [employer].  Viral Illness, Adult Viruses are tiny germs that can get into a person's body and cause illness. There are many  different types of viruses, and they cause many types of illness. Viral illnesses can range from mild to severe. They can affect various parts of the body. Common illnesses that are caused by a virus include colds and the flu. Viral illnesses also include serious conditions such as HIV/AIDS (human immunodeficiency virus/acquired immunodeficiency syndrome). A few viruses have been linked to certain cancers. What are the causes? Many types of viruses can cause illness. Viruses invade cells in your body, multiply, and cause the infected cells to malfunction or die. When the cell dies, it releases more of the virus. When this happens, you develop symptoms of the illness, and the virus continues to spread to other cells. If the virus takes over the function of the cell, it can cause the cell to divide and grow out of control, as is the case when a virus causes cancer. Different viruses get into the body in different ways. You can get a virus by:  Swallowing food or water that is contaminated with the virus.  Breathing in droplets that have been coughed or sneezed into the air by an infected person.  Touching a surface that has been contaminated with the virus and then touching your eyes, nose, or mouth.  Being bitten by an insect or animal that carries the virus.  Having sexual contact with a person who is infected with the virus.  Being exposed to blood or fluids that contain the virus, either through an open cut or during a transfusion. If a virus enters your body, your body's defense system (immune system) will try to fight the virus. You may be at higher risk for a viral illness if your immune system is weak. What are the signs or symptoms? Symptoms vary depending on the type of virus and the location of the cells that it invades. Common symptoms of the main types of viral illnesses include: Cold and flu viruses  Fever.  Headache.  Sore throat.  Muscle aches.  Nasal  congestion.  Cough. Digestive system (gastrointestinal) viruses  Fever.  Abdominal pain.  Nausea.  Diarrhea. Liver viruses (hepatitis)  Loss of appetite.  Tiredness.  Yellowing of the skin (jaundice). Brain and spinal cord viruses  Fever.  Headache.  Stiff neck.  Nausea and vomiting.  Confusion or sleepiness.  Skin viruses  Warts.  Itching.  Rash. Sexually transmitted viruses  Discharge.  Swelling.  Redness.  Rash. How is this treated? Viruses can be difficult to treat because they live within cells. Antibiotic medicines do not treat viruses because these drugs do not get inside cells. Treatment for a viral illness may include:  Resting and drinking plenty of fluids.  Medicines to relieve symptoms. These can include over-the-counter medicine for pain and fever, medicines for cough or congestion, and medicines to relieve diarrhea.  Antiviral medicines. These drugs are available only for certain types of viruses. They may help reduce flu symptoms if taken early. There are also many antiviral medicines for hepatitis and HIV/AIDS. Some viral illnesses can be prevented with vaccinations. A common example is the flu shot. Follow these instructions at home: Medicines   Take over-the-counter and prescription medicines only as told by your health care provider.  If you were prescribed an antiviral medicine, take it as told by your health care provider. Do not stop taking the medicine even if you start to feel better.  Be aware of when antibiotics are needed and when they are not needed. Antibiotics do not treat viruses. If your health care provider thinks that you may have a bacterial infection as well as a viral infection, you may get an antibiotic. ? Do not ask for an antibiotic prescription if you have been diagnosed with a viral illness. That will not make your illness go away faster. ? Frequently taking antibiotics when they are not needed can lead to  antibiotic resistance. When this develops, the medicine no longer works against the bacteria that it normally fights. General instructions  Drink enough fluids to keep your urine clear or pale yellow.  Rest as much as possible.  Return to your normal activities as told by your health care provider. Ask your health care provider what activities are safe for you.  Keep all follow-up visits as told by your health care provider. This is important. How is this prevented? Take these actions to reduce your risk of viral infection:  Eat a healthy diet and get enough rest.  Wash your hands often with soap and water. This is especially important when you are in public places. If soap and water are not available, use hand sanitizer.  Avoid close contact with friends and family who have a viral illness.  If you travel to areas where viral gastrointestinal infection is common, avoid drinking water or eating raw food.  Keep your immunizations up to date. Get a flu shot every year as told by your health care provider.  Do not share toothbrushes, nail clippers, razors, or needles with other people.  Always practice safe sex.  Contact a health care provider if:  You have symptoms of a viral illness that do not go away.  Your symptoms come back after going away.  Your symptoms get worse. Get help right away if:  You have trouble breathing.  You have a severe headache or a stiff neck.  You have severe vomiting or abdominal pain. This information is not intended to replace advice given to you by your health care provider. Make sure you discuss any questions you have with your health care provider. Document Revised: 12/28/2016 Document Reviewed: 05/27/2015 Elsevier Patient Education  2020 [employer].

## 2019-03-11 NOTE — Progress Notes (Signed)
VIRTUAL VISIT VIA VIDEO  I connected with Bobby Jimenez on 03/11/19 at 11:30 AM EST by a video enabled telemedicine application and verified that I am speaking with the correct person using two identifiers. Location patient: Home Location provider: [facility], Office Persons participating in the virtual visit: Patient, Dr. Claiborne Billings and R.Baker, LPN  I discussed the limitations of evaluation and management by telemedicine and the availability of in person appointments. The patient expressed understanding and agreed to proceed.   SUBJECTIVE Chief Complaint  Patient presents with  . Cough    x2 days   . Nasal Congestion  . Headache  . Sore Throat   PCP: Dr. Milinda Cave  HPI: Bobby Jimenez is a 39 y.o. male present to discuss new onset cough, nasal congestion, headache and sore throat of 2 days duration.  He denies loss of sense of taste or smell, fever, chills, nausea, vomit or shortness of breath.  He reports he has been wearing his mask in public and washing his hands frequently.  He has not had any known exposures to Covid or other acute illnesses.  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  He has not taken any medications for comfort/symptoms.  ROS: See pertinent positives and negatives per HPI.  Patient Active Problem List   Diagnosis Date Noted  . Obesity (BMI 30-39.9) 01/07/2019  . Abdominal pain 12/30/2018  . Fatty liver disease, nonalcoholic 12/30/2018  . Sinus tachycardia 12/27/2016  . Atelectasis 12/27/2016  . Empyema of gallbladder   . Sepsis due to undetermined organism (HCC) 12/23/2016  . Hyponatremia 12/23/2016  . Hypokalemia 12/23/2016  . Calculus of gallbladder with acute cholecystitis without obstruction   . GERD (gastroesophageal reflux disease) 09/15/2014  . Nausea with vomiting 09/15/2014  . Diarrhea 09/15/2014  . Left sided abdominal pain 09/15/2014  . Alteration consciousness  12/23/2013  . Hypertension   . Anxiety   . Dizziness   . Lightheaded     Social History   Tobacco Use  . Smoking status: Former Smoker    Packs/day: 1.00    Types: Cigarettes  . Smokeless tobacco: Former Neurosurgeon  . Tobacco comment: quit Nov 2018  Substance Use Topics  . Alcohol use: Yes    Alcohol/week: 1.0 standard drinks    Types: 1 Cans of beer per week    Comment: heavy in past, short period of time in 20s.; 12/30/18 occasional about 1 beer a month    Current Outpatient Medications:  .  ABILIFY 10 MG tablet, Take 5 mg by mouth 2 (two) times daily. , Disp: , Rfl:  .  amantadine (SYMMETREL) 100 MG capsule, Take 100 mg by mouth 2 (two) times daily. , Disp: , Rfl:  .  clonazePAM (KLONOPIN) 0.5 MG tablet, Take 0.25-0.5 mg by mouth 2 (two) times daily. Takes 1/2 tablet in am and 1 tablet at bedtime, Disp: , Rfl:  .  dicyclomine (BENTYL) 10 MG capsule, TAKE 1 CAPSULE BY MOUTH 4 TIMES DAILY WITH MEALS AND AT BEDTIME AS NEEDED. HOLD FOR CONSTIPATION., Disp: 120 capsule, Rfl: 5 .  lisinopril (PRINIVIL,ZESTRIL) 10 MG tablet, Take 10 mg by mouth daily., Disp: , Rfl:  .  metoprolol tartrate (LOPRESSOR) 25 MG tablet, Take 1 tablet (25 mg total) by mouth 2 (two) times daily., Disp: 60 tablet, Rfl: 1 .  Multiple Vitamin (MULTIVITAMIN) tablet, Take 1 tablet by mouth daily., Disp: , Rfl:  .  pantoprazole (PROTONIX)  40 MG tablet, TAKE 1 TABLET BY MOUTH ONCE DAILY BEFORE BREAKFAST., Disp: 90 tablet, Rfl: 3 .  rosuvastatin (CRESTOR) 20 MG tablet, Take 1 tablet (20 mg total) by mouth daily., Disp: 90 tablet, Rfl: 1 .  topiramate (TOPAMAX) 200 MG tablet, Take 1 tablet by mouth 2 (two) times daily., Disp: , Rfl:  .  VASCEPA 1 g CAPS, Take 2 capsules by mouth 2 (two) times daily., Disp: , Rfl:  .  ZOLOFT 100 MG tablet, Take 150 mg by mouth daily. , Disp: , Rfl:   Allergies  Allergen Reactions  . Pineapple Shortness Of Breath and Swelling  . Latex Swelling  . Morphine And Related Hives and Itching  .  Statins     "locks urinary tract up"    OBJECTIVE: Ht 5\' 10"  (1.778 m)   BMI 38.34 kg/m  Gen: No acute distress. Nontoxic in appearance.  HENT: AT. [place].  MMM.  Nasal congestion present. Eyes:Pupils Equal Round Reactive to light, Extraocular movements intact,  Conjunctiva without redness, discharge or icterus. Chest: Cough or shortness of breath not present Neuro: Alert. Oriented x3   ASSESSMENT AND PLAN: Bobby Jimenez is a 39 y.o. male present for  Cough/sore throat/headache/nasal congestion Rest, hydrate.  Start OTC flonase, mucinex (DM if cough) and  nasal saline.  Start daily antihistamine of choice (Zyrtec, Allegra etc.) - Novel Coronavirus, NAA ([facility]) -Patient was provided information on scheduling Covid testing.  Order was placed today for him.  He was encouraged to self isolate until test results are available. -Patient reports he is not in need of a doctor's excuse for absence.    -If positive test, patient will be encouraged to continue self-isolation until Thursday, February 18.  At that time if symptoms are improving and has remained fever free for greater than 72 hours he can do into his isolation.  -If Covid test is negative, continue above recommendations on OTC medications and follow-up in 7 to 10 days if symptoms are not improving or worsening.  At that time would consider treating for sinusitis/pharyngitis.  Orders Placed This Encounter  Procedures  . Novel Coronavirus, NAA ([facility])   No orders of the defined types were placed in this encounter.    Howard Pouch, DO 03/11/2019

## 2019-03-12 LAB — NOVEL CORONAVIRUS, NAA: SARS-CoV-2, NAA: NOT DETECTED

## 2019-03-25 ENCOUNTER — Other Ambulatory Visit: Payer: Self-pay

## 2019-03-25 ENCOUNTER — Encounter: Payer: Medicare Other | Attending: Family Medicine | Admitting: Nutrition

## 2019-03-25 ENCOUNTER — Encounter: Payer: Self-pay | Admitting: Nutrition

## 2019-03-25 VITALS — Ht 70.0 in | Wt 260.6 lb

## 2019-03-25 DIAGNOSIS — K760 Fatty (change of) liver, not elsewhere classified: Secondary | ICD-10-CM

## 2019-03-25 DIAGNOSIS — E782 Mixed hyperlipidemia: Secondary | ICD-10-CM | POA: Insufficient documentation

## 2019-03-25 DIAGNOSIS — E669 Obesity, unspecified: Secondary | ICD-10-CM

## 2019-03-25 DIAGNOSIS — I10 Essential (primary) hypertension: Secondary | ICD-10-CM | POA: Insufficient documentation

## 2019-03-25 NOTE — Progress Notes (Signed)
  Medical Nutrition Therapy:  Appt start time: 1615 end time:  1630  Assessment:  Primary concerns today: Obesity. PMH: HTN, Hyperlipidemia, Sleep apnea, Anxiety and GERD.Marland Kitchen  Lost 6 lbs. Cut out sodas and sweet tea.  Eating much more fresh fruits and vegetable. Using more herbs and spices. Sleeping much better. Feels better. Still using Cpap.  Has been walking and watching his portions. Cut out snacks. Breathing is better also. Goes back to see GI for TG f/u in near future and for fatty liver.  CMP Latest Ref Rng & Units 01/09/2019 11/12/2018 10/29/2017  Glucose 70 - 99 mg/dL 96 - -  BUN 6 - 23 mg/dL 17 12 -  Creatinine 6.94 - 1.50 mg/dL 8.54 6.27 -  Sodium 035 - 145 mEq/L 139 - -  Potassium 3.5 - 5.1 mEq/L 4.0 - -  Chloride 96 - 112 mEq/L 106 - -  CO2 19 - 32 mEq/L 24 - -  Calcium 8.4 - 10.5 mg/dL 9.4 - -  Total Protein 6.0 - 8.3 g/dL 7.1 - -  Total Bilirubin 0.2 - 1.2 mg/dL 0.4 - -  Alkaline Phos 39 - 117 U/L 77 - 65  AST 0 - 37 U/L 21 - 33  ALT 0 - 53 U/L 37 - 51(A)   Lipid Panel     Component Value Date/Time   CHOL 168 01/09/2019 0939   TRIG (H) 01/09/2019 0939    581.0 Triglyceride is over 400; calculations on Lipids are invalid.   HDL 36.40 (L) 01/09/2019 0939   CHOLHDL 5 01/09/2019 0939   LDLDIRECT 66.0 01/09/2019 0939     Preferred Learning Style:  No preference indicated   Learning Readiness:    Change in progress   MEDICATIONS:    DIETARY INTAKE:     24-hr recall:  B ( AM): Raisin bran and egg or frosted mini wheat, fruit,  Snk ( AM):   L ( PM): Meatloaf, pinto beans, potato salad and fried okra, water Snk ( PM):  D ( PM):Roast beef sandwich on [employer] bread , water Snk ( PM):  Beverages:  water.  Usual physical activity:  ADL,   Estimated energy needs: 1800 calories 200 g carbohydrates 135 g protein 50 g fat  Progress Towards Goal(s):  In progress.   Nutritional Diagnosis:  NB-1.1 Food and nutrition-related knowledge deficit As related to  Obesity, hyperlipidemia.  As evidenced by BMI > 30 and TG > 500.    Intervention Weight loss education and Hyperlipidemia education provided on My Plate, CHO counting, meal planning, portion sizes, timing of meals, avoiding snacks between meals, ttaking medications as prescribed, benefits of exercising 60 minutes per day and prevention of DM. Cut out sweet tea and processed foods.Goals  Follow My Plate Exercise 60 minutes three times per week Keep the great job Lose 1-2 lbs per week. Drink only water Keep fresh fruits and vegetables.  . Teaching Method Utilized:  Visual Auditory Hands on  Handouts given during visit include:  The Plate Method   Meal Plan Card  Weight loss tips.     Barriers to learning/adherence to lifestyle change: none  Demonstrated degree of understanding via:  Teach Back   Monitoring/Evaluation:  Dietary intake, exercise,and body weight 1 month. Marland Kitchen

## 2019-03-25 NOTE — Patient Instructions (Signed)
Follow My Plate Exercise 60 minutes three times per week Keep the great job Lose 1-2 lbs per week. Drink only water Keep fresh fruits and vegetables.

## 2019-03-27 ENCOUNTER — Telehealth: Payer: Self-pay

## 2019-03-27 NOTE — Telephone Encounter (Signed)
Patient states that pharmacy is requesting new prescription for CPAP supplies   [facility] - [place], [place] [phone]   Okay to wait until Monday- he has supplies to last until next week

## 2019-03-30 NOTE — Telephone Encounter (Signed)
Written Rx done for CPAP supplies, faxed to [facility], fax confirmation received.

## 2019-03-30 NOTE — Telephone Encounter (Signed)
OK for RF of CPAP supplies

## 2019-03-30 NOTE — Telephone Encounter (Signed)
Patient advised and voiced understanding. He declined referral for now. Would like to proceed with refill for CPAP supplies.

## 2019-03-30 NOTE — Telephone Encounter (Signed)
Pt's last visit was 03/11/19, acute but est.care 01/07/19. Due for return o/v 07/09/19.  Please advise, thanks. I do not see any past orders for CPAP supplies but last sleep study done 12/17/17.

## 2019-03-30 NOTE — Telephone Encounter (Signed)
I can authorize RF of his CPAP supplies, but if he ever requires a new CPAP machine or needs recommendations regarding settings for the pressure then he will need to follow up with a sleep medicine specialist.  I am not trained to manage this condition. He got his sleep study at [facility] in 2019 and this was read by Dr. Lestine Mount, but I suspect Dr Juanetta Gosling was managing this problem when he was seeing him (Dr. Juanetta Gosling is an internist AND a pulmonologist). Ask if he would like referral to new sleep apnea specialist at this time. Let me know-thx

## 2019-04-08 DIAGNOSIS — F3132 Bipolar disorder, current episode depressed, moderate: Secondary | ICD-10-CM | POA: Diagnosis not present

## 2019-04-08 DIAGNOSIS — F4011 Social phobia, generalized: Secondary | ICD-10-CM | POA: Diagnosis not present

## 2019-04-08 DIAGNOSIS — F902 Attention-deficit hyperactivity disorder, combined type: Secondary | ICD-10-CM | POA: Diagnosis not present

## 2019-05-05 ENCOUNTER — Ambulatory Visit (INDEPENDENT_AMBULATORY_CARE_PROVIDER_SITE_OTHER): Payer: Medicare Other | Admitting: Nurse Practitioner

## 2019-05-05 ENCOUNTER — Encounter: Payer: Self-pay | Admitting: Nurse Practitioner

## 2019-05-05 ENCOUNTER — Other Ambulatory Visit: Payer: Self-pay

## 2019-05-05 VITALS — BP 129/79 | HR 100 | Temp 96.8°F | Ht 70.0 in | Wt 269.6 lb

## 2019-05-05 DIAGNOSIS — K760 Fatty (change of) liver, not elsewhere classified: Secondary | ICD-10-CM | POA: Diagnosis not present

## 2019-05-05 DIAGNOSIS — K219 Gastro-esophageal reflux disease without esophagitis: Secondary | ICD-10-CM | POA: Diagnosis not present

## 2019-05-05 DIAGNOSIS — K580 Irritable bowel syndrome with diarrhea: Secondary | ICD-10-CM | POA: Insufficient documentation

## 2019-05-05 DIAGNOSIS — K589 Irritable bowel syndrome without diarrhea: Secondary | ICD-10-CM | POA: Insufficient documentation

## 2019-05-05 NOTE — Patient Instructions (Signed)
Your health issues we discussed today were:   Irritable bowel syndrome, diarrhea type (IBS-D): 1. Continue using Bentyl as you have been to help with your abdominal discomfort and diarrhea 2. Let us know if you have any worsening or severe symptoms  GERD (reflux/heartburn): 1. Continue to take your acid blocker to help prevent worsening GERD 2. As we discussed, as you are able to lose weight we may be able to eventually come off your medication 3. Call us if you have any worsening or severe symptoms  Fatty liver disease: 1. As we discussed last time, fatty liver disease is usually because of high cholesterol, high blood pressure, obesity, high triglycerides, and/or diabetes 2. The hallmark of treatment fatty liver disease is diet and exercise for weight loss, controlling your blood pressure, cholesterol, triglycerides. 3. You are doing great so far!  Continue to see the dietitian based on their recommendations 4. Call us if you have any problems  Overall I recommend:  1. Continue your other current medications 2. Return for follow-up in 6 months 3. Call us if you have any questions or concerns   ---------------------------------------------------------------  COVID-19 Vaccine Information can be found at: [email or web address] For questions related to vaccine distribution or appointments, please email [email or web address] or call [phone].   ---------------------------------------------------------------   At [facility] we value your feedback. You may receive a survey about your visit today. Please share your experience as we strive to create trusting relationships with our patients to provide genuine, compassionate, quality care.  We appreciate your understanding and patience as we review any laboratory studies, imaging, and other diagnostic tests that are ordered as we care for you. Our office policy is 5  business days for review of these results, and any emergent or urgent results are addressed in a timely manner for your best interest. If you do not hear from our office in 1 week, please contact us.   We also encourage the use of MyChart, which contains your medical information for your review as well. If you are not enrolled in this feature, an access code is on this after visit summary for your convenience. Thank you for allowing [place] to be involved in your care.  It was great to see you today!  I hope you have a great summer!!

## 2019-05-05 NOTE — Assessment & Plan Note (Signed)
GERD currently doing well on PPI.  Recommend continue his current medications.  We discussed if he is able to successfully lose weight we can consider coming off his PPI to see how he does.  He may have resolution of GERD symptoms with significant weight loss.

## 2019-05-05 NOTE — Assessment & Plan Note (Signed)
Fatty liver disease with multiple risk factors akin to metabolic syndrome as per HPI and previous visit.  He is really taking the hard to message on weight loss, diet, exercise, healthier lifestyle.  He is meeting with the dietitian and has lost 6 pounds in his first months.  He is eating a much healthier diet, engaging in more physical activity.  He seems committed to diet and exercise for weight loss.  We can continue to monitor his liver labs as he progresses.  Follow-up in 6 months.

## 2019-05-05 NOTE — Assessment & Plan Note (Signed)
The patient's diarrhea and abdominal pain likely due to IBS diarrhea today.  He is doing quite well on Bentyl and has rare, brief (couple seconds at most) abdominal discomfort.  Diarrhea has resolved.  Recommend he continue with Bentyl and follow-up in 6 months.  Not a candidate for Viberzi due to status post cholecystectomy.

## 2019-05-05 NOTE — Progress Notes (Signed)
CC'ED TO PCP 

## 2019-05-05 NOTE — Progress Notes (Signed)
Referring Provider: Kari Baars, MD Primary Care Physician:  Jeoffrey Massed, MD Primary GI:  Dr. Jena Gauss  Chief Complaint  Patient presents with  . Abdominal Pain    occ twinge but better    HPI:   Bobby Jimenez is a 39 y.o. male who presents for follow-up on abdominal pain and diarrhea.  The patient was last seen in our office 12/30/2018 for the same as well as GERD and fatty liver disease.  Noted history of GERD and possible IBS.  Previously, GERD well managed on PPI and IBS well managed on Bentyl.  Abdominal ultrasound 10/24/2018 with marked hepatic steatosis and mildly coarsened echotexture raising the question of background liver disease and recommended CT for further assessment of suspected hepatic lesion that could represent area of fatty sparing artifact.  CT was actually completed on 11/17/2018 which found hepatic steatosis and no focal lesion, no specific stigmata of hepatic cirrhosis.  Overall felt likely fatty liver disease.  At his last visit doing well with some intermittent mid abdominal pain about once a week that varies based on diet and typically self resolves after an hour.  No improvement with bowel movement.  Has not tried Bentyl episodically for this.  Diarrhea doing well much less frequent on Bentyl.  GERD doing well on PPI.  Noted high risk for fatty liver disease including central obesity, hypertension, "real bad" cholesterol.  However, he is not diabetic.  Recommended continue Bentyl up to 3 times a day, with fourth dose as needed, Imodium as needed.  We had a frank discussion on treatment of fatty liver disease including diet and exercise with good control of blood pressure, blood sugar, cholesterol, weight.  Per his agreement/request we referred him to a dietitian.  Additional printed education was provided.  Recommended otherwise to follow-up in 4 months.  It appears he did see a dietitian/nutritionist on 02/23/2019.  He appeared motivated at that visit and  noted to be willing to change his diet and exercise in order to lose weight and feel better.  He was provided education with a goal of 1 to 2 pounds of weight loss a week.  Recommended follow-up in 1 month.  At his follow-up visit he noted he had lost 6 pounds in the past month, cut out sodas and sweet tea, eating more fresh fruits and vegetables and using herbs and spices.  He is sleeping better, feeling better.  Still using CPAP.  He has been walking and watching his portions, has cut out snacks.  Follow-up with dietitian is scheduled for June 2021.  Today he states he's doing better. Has had good experience with the dietician. Only has "occasional twinge." Is currently taking Bentyl three times a day. Twinges are not common, about once a month, not bad, self-resolve within a couple seconds. Diarrhea has subsided. He is s/p cholecystectomy. Denies N/V, hematochezia, melena, fever, chills. Sleeping better, feeling better. Walking more. Mor physically active overall. Denies URI or flu-like symptoms. Denies loss of sense of taste or smell. He hasn't had a vaccine yet and is interested in getting the vaccine. Denies chest pain, dyspnea, dizziness, lightheadedness, syncope, near syncope. Denies any other upper or lower GI symptoms.  GERD doing well on PPI.  Past Medical History:  Diagnosis Date  . ADHD (attention deficit hyperactivity disorder)   . Anxiety   . Bipolar disorder (HCC)   . Cervical radiculopathy   . Dizziness   . Food allergy    pineapple  . GERD (  gastroesophageal reflux disease)   . Headache   . Hepatic steatosis    u/s and CT confirmed 2020--no cirrhosis.  . Hypercholesterolemia   . Hypertension   . IBS (irritable bowel syndrome)   . Latex allergy   . Lightheaded   . Low back pain   . OSA on CPAP 10/16/2016   (Dr. Lestine Mount)  . Tremors of nervous system    arms, dx'd as lasting side effect from lithium use in remote past: psych has him on amantadine for this.  . Vertigo      Past Surgical History:  Procedure Laterality Date  . CHOLECYSTECTOMY N/A 12/26/2016   Procedure: LAPAROSCOPIC CHOLECYSTECTOMY;  Surgeon: Franky Macho, MD;  Location: [facility];  Service: General;  Laterality: N/A;  . KNEE SURGERY Right 1997   arthroscopic  . polysomnogram  09/2016   Mild/mod OSA->CPAP (Dr. Lestine Mount)  . WISDOM TOOTH EXTRACTION  1997    Current Outpatient Medications  Medication Sig Dispense Refill  . ABILIFY 10 MG tablet Take 5 mg by mouth 2 (two) times daily.     Marland Kitchen amantadine (SYMMETREL) 100 MG capsule Take 100 mg by mouth 2 (two) times daily.     . clonazePAM (KLONOPIN) 0.5 MG tablet Take 0.25-0.5 mg by mouth 2 (two) times daily. Takes 1/2 tablet in am and 1 tablet at bedtime    . dicyclomine (BENTYL) 10 MG capsule TAKE 1 CAPSULE BY MOUTH 4 TIMES DAILY WITH MEALS AND AT BEDTIME AS NEEDED. HOLD FOR CONSTIPATION. 120 capsule 3  . lisinopril (PRINIVIL,ZESTRIL) 10 MG tablet Take 10 mg by mouth daily.    . metoprolol tartrate (LOPRESSOR) 25 MG tablet Take 1 tablet (25 mg total) by mouth 2 (two) times daily. 60 tablet 1  . Multiple Vitamin (MULTIVITAMIN) tablet Take 1 tablet by mouth daily.    . pantoprazole (PROTONIX) 40 MG tablet TAKE 1 TABLET BY MOUTH ONCE DAILY BEFORE BREAKFAST. 90 tablet 3  . rosuvastatin (CRESTOR) 20 MG tablet Take 1 tablet (20 mg total) by mouth daily. 90 tablet 1  . VASCEPA 1 g CAPS Take 2 capsules by mouth 2 (two) times daily.    Marland Kitchen ZOLOFT 100 MG tablet Take 150 mg by mouth daily.      No current facility-administered medications for this visit.    Allergies as of 05/05/2019 - Review Complete 05/05/2019  Allergen Reaction Noted  . Pineapple Shortness Of Breath and Swelling 05/14/2014  . Latex Swelling 05/14/2014  . Morphine and related Hives and Itching 05/14/2014  . Statins  12/30/2018    Family History  Adopted: Yes  Problem Relation Age of Onset  . High blood pressure Mother   . Seizures Mother   . Colon cancer Neg Hx     Social  History   Socioeconomic History  . Marital status: Divorced    Spouse name: Not on file  . Number of children: 0  . Years of education: 46  . Highest education level: Not on file  Occupational History  . Not on file  Tobacco Use  . Smoking status: Former Smoker    Packs/day: 1.00    Types: Cigarettes  . Smokeless tobacco: Former Neurosurgeon  . Tobacco comment: quit Nov 2018  Substance and Sexual Activity  . Alcohol use: Yes    Alcohol/week: 1.0 standard drinks    Types: 1 Cans of beer per week    Comment: heavy in past, short period of time in 20s.; 05/05/19 occasional about 1 beer a month  . Drug  use: No    Comment: Quit 1996  . Sexual activity: Not on file  Other Topics Concern  . Not on file  Social History Narrative   Divorced, no children.   Educ: college at [employer]   Patient lives at home with his father and unemployed.  Disability for psych reasons.   Education some college.   Both handed.   Caffeine three cups daily soda and coffee.   Alc: occ beer (remote hx of alc abuse, marijuana, acid--no IV drugs) in 2005-2006, surround his problems coping with GF's death.   Tobacco: former cig smoker, 20 pack-yr hx , quit 2018.   Social Determinants of Health   Financial Resource Strain:   . Difficulty of Paying Living Expenses:   Food Insecurity:   . Worried About Charity fundraiser in the Last Year:   . Arboriculturist in the Last Year:   Transportation Needs:   . Film/video editor (Medical):   Marland Kitchen Lack of Transportation (Non-Medical):   Physical Activity:   . Days of Exercise per Week:   . Minutes of Exercise per Session:   Stress:   . Feeling of Stress :   Social Connections:   . Frequency of Communication with Friends and Family:   . Frequency of Social Gatherings with Friends and Family:   . Attends Religious Services:   . Active Member of Clubs or Organizations:   . Attends Archivist Meetings:   Marland Kitchen Marital Status:      Subjective: ROS   Objective: BP 129/79   Pulse 100   Temp (!) 96.8 F (36 C) (Temporal)   Ht 5\' 10"  (1.778 m)   Wt 269 lb 9.6 oz (122.3 kg)   BMI 38.68 kg/m  Physical Exam    05/05/2019 11:40 AM   Disclaimer: This note was dictated with voice recognition software. Similar sounding words can inadvertently be transcribed and may not be corrected upon review.

## 2019-05-08 ENCOUNTER — Other Ambulatory Visit: Payer: Self-pay

## 2019-05-13 ENCOUNTER — Encounter: Payer: Self-pay | Admitting: Family Medicine

## 2019-05-19 ENCOUNTER — Other Ambulatory Visit: Payer: Self-pay | Admitting: Family Medicine

## 2019-05-19 ENCOUNTER — Encounter: Payer: Self-pay | Admitting: Family Medicine

## 2019-05-19 NOTE — Telephone Encounter (Signed)
RF request for Vascepa LOV:01/07/19 Next ov: 07/09/19 Last written:08/16/18  Medication not originally prescribed by you, currently pending. Please advise

## 2019-05-21 ENCOUNTER — Other Ambulatory Visit: Payer: Self-pay | Admitting: Family Medicine

## 2019-05-21 NOTE — Telephone Encounter (Signed)
RF request for metoprolol 25 mg BID.  RX not previously prescribed by you, rx'd by Dr Juanetta Gosling. Last OV 01/07/2019 Next OV 07/09/19  Okay to refill?

## 2019-05-28 ENCOUNTER — Ambulatory Visit: Payer: Medicare Other | Attending: Internal Medicine

## 2019-05-28 ENCOUNTER — Other Ambulatory Visit: Payer: Self-pay | Admitting: Family Medicine

## 2019-05-28 DIAGNOSIS — Z23 Encounter for immunization: Secondary | ICD-10-CM

## 2019-05-28 NOTE — Progress Notes (Signed)
   Covid-19 Vaccination Clinic  Name:  Bobby Jimenez    MRN: [id number] DOB: 1980/07/27  05/28/2019  [name] was observed post Covid-19 immunization for 15 minutes without incident. He was provided with Vaccine Information Sheet and instruction to access the V-Safe system.   [name] was instructed to call 911 with any severe reactions post vaccine: Marland Kitchen Difficulty breathing  . Swelling of face and throat  . A fast heartbeat  . A bad rash all over body  . Dizziness and weakness   Immunizations Administered    Name Date Dose VIS Date Route   Moderna COVID-19 Vaccine 05/28/2019 12:21 PM 0.5 mL 12/2018 Intramuscular   Manufacturer: Moderna   Lot: [id number]   NDC: 09030-149-96

## 2019-06-03 DIAGNOSIS — F4011 Social phobia, generalized: Secondary | ICD-10-CM | POA: Diagnosis not present

## 2019-06-03 DIAGNOSIS — F902 Attention-deficit hyperactivity disorder, combined type: Secondary | ICD-10-CM | POA: Diagnosis not present

## 2019-06-03 DIAGNOSIS — F3132 Bipolar disorder, current episode depressed, moderate: Secondary | ICD-10-CM | POA: Diagnosis not present

## 2019-06-08 ENCOUNTER — Other Ambulatory Visit: Payer: Self-pay | Admitting: Gastroenterology

## 2019-06-23 DIAGNOSIS — M25561 Pain in right knee: Secondary | ICD-10-CM | POA: Diagnosis not present

## 2019-06-23 DIAGNOSIS — M25562 Pain in left knee: Secondary | ICD-10-CM | POA: Diagnosis not present

## 2019-06-25 ENCOUNTER — Ambulatory Visit: Payer: Medicare Other | Attending: Internal Medicine

## 2019-06-25 DIAGNOSIS — Z23 Encounter for immunization: Secondary | ICD-10-CM

## 2019-06-25 NOTE — Progress Notes (Signed)
   Covid-19 Vaccination Clinic  Name:  Bobby Jimenez    MRN: [id number] DOB: 13-Nov-1980  06/25/2019  Bobby Jimenez was observed post Covid-19 immunization for 15 minutes without incident. He was provided with Vaccine Information Sheet and instruction to access the V-Safe system.   Bobby Jimenez was instructed to call 911 with any severe reactions post vaccine: Marland Kitchen Difficulty breathing  . Swelling of face and throat  . A fast heartbeat  . A bad rash all over body  . Dizziness and weakness   Immunizations Administered    Name Date Dose VIS Date Route   Moderna COVID-19 Vaccine 06/25/2019 12:08 PM 0.5 mL 12/2018 Intramuscular   Manufacturer: Moderna   Lot: [id number]   NDC: 69794-801-65

## 2019-07-09 ENCOUNTER — Encounter: Payer: Self-pay | Admitting: Family Medicine

## 2019-07-09 ENCOUNTER — Other Ambulatory Visit: Payer: Self-pay

## 2019-07-09 ENCOUNTER — Ambulatory Visit (INDEPENDENT_AMBULATORY_CARE_PROVIDER_SITE_OTHER): Payer: Medicare Other | Admitting: Family Medicine

## 2019-07-09 VITALS — BP 116/74 | HR 74 | Temp 99.4°F | Resp 16 | Ht 70.0 in | Wt 264.4 lb

## 2019-07-09 DIAGNOSIS — E669 Obesity, unspecified: Secondary | ICD-10-CM

## 2019-07-09 DIAGNOSIS — K760 Fatty (change of) liver, not elsewhere classified: Secondary | ICD-10-CM

## 2019-07-09 DIAGNOSIS — Z79899 Other long term (current) drug therapy: Secondary | ICD-10-CM

## 2019-07-09 DIAGNOSIS — I10 Essential (primary) hypertension: Secondary | ICD-10-CM

## 2019-07-09 DIAGNOSIS — E782 Mixed hyperlipidemia: Secondary | ICD-10-CM | POA: Diagnosis not present

## 2019-07-09 MED ORDER — FEXOFENADINE HCL 180 MG PO TABS
180.0000 mg | ORAL_TABLET | Freq: Every day | ORAL | 1 refills | Status: DC
Start: 2019-07-09 — End: 2023-04-25

## 2019-07-09 MED ORDER — FLUTICASONE PROPIONATE 50 MCG/ACT NA SUSP
2.0000 | Freq: Every day | NASAL | 6 refills | Status: DC
Start: 2019-07-09 — End: 2020-04-06

## 2019-07-09 NOTE — Progress Notes (Signed)
OFFICE VISIT  07/09/2019   CC:  Chief Complaint  Patient presents with   Follow-up    RCI, pt is not fasting     HPI:    Patient is a 39 y.o.  male who presents for 6 mo f/u HTN, HLD, obesity class II, and hepatic steatosis. A/P as of last visit: "1) HTN: The current medical regimen is effective;  continue present plan and medications. Lytes/cr--future.  2) HLD: poorly controlled trigs, tolerating vascepa 2 mg bid and crestor 10mg  qd. FLP and hepatic panel repeat and then we'll decide on any changes in treatment. May need to see [facility] cardiology lipid clinic.  3) GERD: stable on daily PPI long term.  Continue to try to eat GERD-friendly diet.  4) Bipolar d/o, anxiety, hx of tremors from lithium: all fairly well controlled and managed by psychiatrist.  5) Health maintenance exam: Reviewed age and gender appropriate health maintenance issues (prudent diet, regular exercise, health risks of tobacco and excessive alcohol, use of seatbelts, fire alarms in home, use of sunscreen).  Also reviewed age and gender appropriate health screening as well as vaccine recommendations. Vaccines: Declined flu vaccine, Tdap UTD 2015, pneumovax given while in hosp for his gallbladder surgery 11/2016. Labs: return for fasting HP labs."  INTERIM HX: Increased rosuvastatin to 20mg  qd 6 mo ago.  No probs with this med.  Taking vascepa as well.  Eating more fresh foods.  Exercise: walk, works around house, occasional jog.  HTN: no home bp monitoring.  Taking med daily.  Allergies bothering him: coughing, sneezing, eyes watering, nose stuffy and runny nose with some PND, some cough occ productive of mucous. No wheezing.  He quit smoking 2018.  Tried zyrtec, claritin, saline nasal spray---saline is helpful some.  +Frontal pressure headaches. No hx of asthma, never been on rx med for allergies.  Thes sx's bother him all day.  ROS: no fevers, no CP, no SOB, no wheezing, no dizziness, no HAs, no rashes,  no melena/hematochezia.  No polyuria or polydipsia.  No myalgias or arthralgias.  No focal weakness, paresthesias, or tremors.  No acute vision or hearing abnormalities. No n/v/d or abd pain.  No palpitations.    Past Medical History:  Diagnosis Date   ADHD (attention deficit hyperactivity disorder)    Anxiety    Bipolar disorder (HCC)    Cervical radiculopathy    Dizziness    lightheaded   Food allergy    pineapple   GERD (gastroesophageal reflux disease)    Headache syndrome    Hepatic steatosis    u/s and CT confirmed 2020--no cirrhosis.   Hypertension    IBS (irritable bowel syndrome)    bentyl helping ([facility])   Latex allergy    Low back pain    Mixed hyperlipidemia    OSA on CPAP 10/16/2016   (Dr. 2019)   Tremors of nervous system    arms, dx'd as lasting side effect from lithium use in remote past: psych has him on amantadine for this.   Vertigo     Past Surgical History:  Procedure Laterality Date   CHOLECYSTECTOMY N/A 12/26/2016   Procedure: LAPAROSCOPIC CHOLECYSTECTOMY;  Surgeon: Lestine Mount, MD;  Location: [facility];  Service: General;  Laterality: N/A;   KNEE SURGERY Right 1997   arthroscopic   polysomnogram  09/2016   Mild/mod OSA->CPAP (Dr. Franky Macho)   WISDOM TOOTH EXTRACTION  1997    Outpatient Medications Prior to Visit  Medication Sig Dispense Refill   ABILIFY  10 MG tablet Take 5 mg by mouth 2 (two) times daily.      amantadine (SYMMETREL) 100 MG capsule Take 100 mg by mouth 2 (two) times daily.      clonazePAM (KLONOPIN) 0.5 MG tablet Take 0.25-0.5 mg by mouth 2 (two) times daily. Takes 1/2 tablet in am and 1 tablet at bedtime     dicyclomine (BENTYL) 10 MG capsule TAKE 1 CAPSULE BY MOUTH 4 TIMES DAILY WITH MEALS AND AT BEDTIME AS NEEDED. HOLD FOR CONSTIPATION. 120 capsule 3   lisinopril (PRINIVIL,ZESTRIL) 10 MG tablet Take 10 mg by mouth daily.     metoprolol tartrate (LOPRESSOR) 25 MG tablet TAKE (1) TABLET  BY MOUTH TWICE DAILY. 180 tablet 3   Multiple Vitamin (MULTIVITAMIN) tablet Take 1 tablet by mouth daily.     pantoprazole (PROTONIX) 40 MG tablet TAKE 1 TABLET BY MOUTH ONCE DAILY BEFORE BREAKFAST. 90 tablet 3   rosuvastatin (CRESTOR) 20 MG tablet TAKE 1 TABLET BY MOUTH ONCE DAILY. 90 tablet 0   VASCEPA 1 g capsule TAKE 2 CAPSULES BY MOUTH 2 TIMES A DAY 360 capsule 3   ZOLOFT 100 MG tablet Take 150 mg by mouth daily.      No facility-administered medications prior to visit.    Allergies  Allergen Reactions   Pineapple Shortness Of Breath and Swelling   Latex Swelling   Morphine And Related Hives and Itching   Statins     "locks urinary tract up"    ROS As per HPI  PE: Blood pressure 116/74, pulse 74, temperature 99.4 F (37.4 C), temperature source Temporal, resp. rate 16, height 5\' 10"  (1.778 m), weight 264 lb 6.4 oz (119.9 kg), SpO2 96 %. Gen: Alert, well appearing.  Patient is oriented to person, place, time, and situation. AFFECT: pleasant, lucid thought and speech. No further exam today.  LABS:  Lab Results  Component Value Date   TSH 2.58 01/09/2019   Lab Results  Component Value Date   WBC 6.7 01/09/2019   HGB 13.9 01/09/2019   HCT 41.1 01/09/2019   MCV 93.6 01/09/2019   PLT 224.0 01/09/2019   Lab Results  Component Value Date   CREATININE 0.87 01/09/2019   BUN 17 01/09/2019   NA 139 01/09/2019   K 4.0 01/09/2019   CL 106 01/09/2019   CO2 24 01/09/2019   Lab Results  Component Value Date   ALT 37 01/09/2019   AST 21 01/09/2019   ALKPHOS 77 01/09/2019   BILITOT 0.4 01/09/2019   Lab Results  Component Value Date   CHOL 168 01/09/2019   Lab Results  Component Value Date   HDL 36.40 (L) 01/09/2019   No results found for: [facility] Lab Results  Component Value Date   TRIG (H) 01/09/2019    581.0 Triglyceride is over 400; calculations on Lipids are invalid.   Lab Results  Component Value Date   CHOLHDL 5 01/09/2019     IMPRESSION  AND PLAN:  1) HTN: The current medical regimen is effective;  continue present plan and medications. Lytes/cr ordered--future.  2) HLD: tolerating increased dose of rosuva+ continues to take 2g vascepa bid FLP and hepatic panel ordered--future when fasting.  3) obesity and hepatic steatosis: still needs to ramp up the healthy diet and vigorous cv exercise and focus on wt loss. Hepatic panel today.  4) High risk med use: Abilify 10mg  qd long term. Along with upcoming glucose and lipid monitoring, I'll order HbA1c. His psychiatrist manages his psych diagnoses/meds.  An After Visit Summary was printed and given to the patient.  FOLLOW UP: Return in about 6 months (around 01/08/2020) for routine chronic illness f/u (fasting).Fasting lab appt at his earliest convenience  Signed:  Santiago Bumpers, MD           07/09/2019

## 2019-07-09 NOTE — Patient Instructions (Signed)
Continue to use saline nasal spray 2-3 times per day to irrigate and moisturize your nasal passages

## 2019-07-16 ENCOUNTER — Other Ambulatory Visit: Payer: Self-pay

## 2019-07-16 ENCOUNTER — Encounter: Payer: Self-pay | Admitting: Family Medicine

## 2019-07-16 ENCOUNTER — Ambulatory Visit (INDEPENDENT_AMBULATORY_CARE_PROVIDER_SITE_OTHER): Payer: Medicare Other | Admitting: Family Medicine

## 2019-07-16 DIAGNOSIS — Z79899 Other long term (current) drug therapy: Secondary | ICD-10-CM | POA: Diagnosis not present

## 2019-07-16 DIAGNOSIS — K760 Fatty (change of) liver, not elsewhere classified: Secondary | ICD-10-CM | POA: Diagnosis not present

## 2019-07-16 DIAGNOSIS — E782 Mixed hyperlipidemia: Secondary | ICD-10-CM

## 2019-07-16 DIAGNOSIS — E669 Obesity, unspecified: Secondary | ICD-10-CM

## 2019-07-16 DIAGNOSIS — I10 Essential (primary) hypertension: Secondary | ICD-10-CM

## 2019-07-16 LAB — LIPID PANEL
Cholesterol: 163 mg/dL (ref 0–200)
HDL: 32.4 mg/dL — ABNORMAL LOW (ref >39.00)
Total CHOL/HDL Ratio: 5
Triglycerides: 837 mg/dL — ABNORMAL HIGH (ref 0.0–149.0)

## 2019-07-16 LAB — COMPREHENSIVE METABOLIC PANEL
ALT: 23 U/L (ref 0–53)
AST: 17 U/L (ref 0–37)
Albumin: 4.6 g/dL (ref 3.5–5.2)
Alkaline Phosphatase: 65 U/L (ref 39–117)
BUN: 14 mg/dL (ref 6–23)
CO2: 29 mEq/L (ref 19–32)
Calcium: 9.3 mg/dL (ref 8.4–10.5)
Chloride: 101 mEq/L (ref 96–112)
Creatinine, Ser: 0.87 mg/dL (ref 0.40–1.50)
GFR: 96.95 mL/min (ref >60.00)
Glucose, Bld: 101 mg/dL — ABNORMAL HIGH (ref 70–99)
Potassium: 4.2 mEq/L (ref 3.5–5.1)
Sodium: 138 mEq/L (ref 135–145)
Total Bilirubin: 0.4 mg/dL (ref 0.2–1.2)
Total Protein: 7.1 g/dL (ref 6.0–8.3)

## 2019-07-16 LAB — LDL CHOLESTEROL, DIRECT: Direct LDL: 53 mg/dL

## 2019-07-16 LAB — HEMOGLOBIN A1C: Hgb A1c MFr Bld: 5.6 % (ref 4.6–6.5)

## 2019-07-17 ENCOUNTER — Other Ambulatory Visit: Payer: Self-pay | Admitting: Family Medicine

## 2019-07-17 DIAGNOSIS — E781 Pure hyperglyceridemia: Secondary | ICD-10-CM

## 2019-07-17 DIAGNOSIS — E782 Mixed hyperlipidemia: Secondary | ICD-10-CM

## 2019-07-17 MED ORDER — GEMFIBROZIL 600 MG PO TABS: 600.0000 mg | ORAL_TABLET | Freq: Two times a day (BID) | ORAL | 2 refills | Status: DC

## 2019-07-28 ENCOUNTER — Other Ambulatory Visit: Payer: Self-pay

## 2019-07-28 ENCOUNTER — Encounter: Payer: Self-pay | Admitting: Nutrition

## 2019-07-28 ENCOUNTER — Encounter: Payer: Medicare Other | Attending: Family Medicine | Admitting: Nutrition

## 2019-07-28 VITALS — Ht 70.0 in | Wt 259.8 lb

## 2019-07-28 DIAGNOSIS — I10 Essential (primary) hypertension: Secondary | ICD-10-CM | POA: Insufficient documentation

## 2019-07-28 DIAGNOSIS — E782 Mixed hyperlipidemia: Secondary | ICD-10-CM

## 2019-07-28 DIAGNOSIS — E669 Obesity, unspecified: Secondary | ICD-10-CM | POA: Insufficient documentation

## 2019-07-28 DIAGNOSIS — K760 Fatty (change of) liver, not elsewhere classified: Secondary | ICD-10-CM | POA: Insufficient documentation

## 2019-07-28 NOTE — Progress Notes (Signed)
  Medical Nutrition Therapy:  Appt start time: 1615 end time:  1645  Assessment:  Primary concerns today: Obesity. PMH: HTN, Hyperlipidemia, Sleep apnea, Anxiety and GERD. Saw Dr. Milinda Cave did blood work. Cholesterol was found to be more genetics. He has been making better food choices. Eating more greens, fresh fruits and cut out fried foods, fast foods/processed foods. Feels much better. BP has been much better.   Has lost 10 lbs in the last 5 weeks. Has been walking some.  Has been taken off of crestor and vasepic, and now on Gemfibrozil.. Still using CPAP.   GI Dr. York Spaniel his fatty liver has improved.    CMP Latest Ref Rng & Units 07/16/2019 01/09/2019 11/12/2018  Glucose 70 - 99 mg/dL 425(Z) 96 -  BUN 6 - 23 mg/dL 14 17 12   Creatinine 0.40 - 1.50 mg/dL 5.63 8.75  Sodium 135 - 145 mEq/L 138 139 -  Potassium 3.5 - 5.1 mEq/L 4.2 4.0 -  Chloride 96 - 112 mEq/L 101 106 -  CO2 19 - 32 mEq/L 29 24 -  Calcium 8.4 - 10.5 mg/dL 9.3 9.4 -  Total Protein 6.0 - 8.3 g/dL 7.1 7.1 -  Total Bilirubin 0.2 - 1.2 mg/dL 0.4 0.4 -  Alkaline Phos 39 - 117 U/L 65 77 -  AST 0 - 37 U/L 17 21 -  ALT 0 - 53 U/L 23 37 -   Lipid Panel     Component Value Date/Time   CHOL 163 07/16/2019 0858   TRIG (H) 07/16/2019 0858    837.0 Triglyceride is over 400; calculations on Lipids are invalid.   HDL 32.40 (L) 07/16/2019 0858   CHOLHDL 5 07/16/2019 0858   LDLDIRECT 53.0 07/16/2019 0858     Preferred Learning Style:  No preference indicated   Learning Readiness:    Change in progress   MEDICATIONS:    DIETARY INTAKE:     24-hr recall:  B ( AM): Raisin bran and egg or frosted mini wheat, fruit,  Snk ( AM):   L ( PM): Grilled chicken and salad, milk, water Snk ( PM):  D ( PM): steak, spring mix and water  Snk ( PM):  Beverages:  water.  Usual physical activity:  ADL,   Estimated energy needs: 1800 calories 200 g carbohydrates 135 g protein 50 g fat  Progress Towards Goal(s):  In  progress.   Nutritional Diagnosis:  NB-1.1 Food and nutrition-related knowledge deficit As related to Obesity, hyperlipidemia.  As evidenced by BMI > 30 and TG > 500.    Intervention Weight loss education and Hyperlipidemia education provided on My Plate, CHO counting, meal planning, portion sizes, timing of meals, avoiding snacks between meals, ttaking medications as prescribed, benefits of exercising 60 minutes per day and prevention of DM. Cut out sweet tea and processed foods.Goals   Goals  Work on losing down to 240 lbs.  Exercise 60 minutes three times per week. Get TG less than 300 mg/dg. Continue high fiber diet. Use spring mix for salads,  . Teaching Method Utilized:  Visual Auditory Hands on  Handouts given during visit include:  The Plate Method   Meal Plan Card  Weight loss tips.     Barriers to learning/adherence to lifestyle change: none  Demonstrated degree of understanding via:  Teach Back   Monitoring/Evaluation:  Dietary intake, exercise,and body weight 4 month. 08-20-1992

## 2019-07-28 NOTE — Patient Instructions (Addendum)
  Goals  Work on losing down to 240 lbs.  Exercise 60 minutes three times per week. Get TG less than 300 mg/dg. Continue high fiber diet. Try using spring mix for salads.

## 2019-09-07 ENCOUNTER — Other Ambulatory Visit: Payer: Self-pay | Admitting: Gastroenterology

## 2019-09-10 ENCOUNTER — Telehealth: Payer: Self-pay | Admitting: Internal Medicine

## 2019-09-10 NOTE — Telephone Encounter (Signed)
Refill request Bentyl #120 capusules with 3 rfs - take 1 capsule po 4 times daily with meals and at bedtime as needed.

## 2019-09-10 NOTE — Telephone Encounter (Signed)
Pt said his pharmacy told him they had faxed [place] a refill request for his dicyclomine and hasn't heard back from [place]. I told him the nurse may be working on a PA and she would get back with him. [phone]

## 2019-09-11 NOTE — Telephone Encounter (Signed)
Rx has already been refilled yesterday

## 2019-09-22 DIAGNOSIS — F3132 Bipolar disorder, current episode depressed, moderate: Secondary | ICD-10-CM | POA: Diagnosis not present

## 2019-09-22 DIAGNOSIS — F902 Attention-deficit hyperactivity disorder, combined type: Secondary | ICD-10-CM | POA: Diagnosis not present

## 2019-09-22 DIAGNOSIS — F4011 Social phobia, generalized: Secondary | ICD-10-CM | POA: Diagnosis not present

## 2019-10-16 ENCOUNTER — Other Ambulatory Visit: Payer: Self-pay | Admitting: Family Medicine

## 2019-10-16 NOTE — Telephone Encounter (Signed)
Unsure if refill appropriate for gemfibrozil.  Patient was being referred to [facility] cardiovascular with Dr.Hilty. His next appt at their office is 10/30/19. Medication pending. Please advise, thanks.  LOV:07/09/19 Next ov: advised to f/u 6 months Last written: 07/17/19(60,2)

## 2019-10-19 NOTE — Telephone Encounter (Signed)
LM for pt to returncall

## 2019-10-19 NOTE — Telephone Encounter (Signed)
I'll send in 2 wk supply of gemfibrozil to get him through to Dr. Blanchie Dessert appt.

## 2019-10-20 NOTE — Telephone Encounter (Signed)
Patient was advised medication sent.

## 2019-10-30 ENCOUNTER — Encounter: Payer: Self-pay | Admitting: Internal Medicine

## 2019-10-30 ENCOUNTER — Other Ambulatory Visit: Payer: Self-pay

## 2019-10-30 ENCOUNTER — Ambulatory Visit (INDEPENDENT_AMBULATORY_CARE_PROVIDER_SITE_OTHER): Payer: Medicare Other | Admitting: Internal Medicine

## 2019-10-30 VITALS — BP 124/72 | HR 82 | Ht 70.0 in | Wt 267.0 lb

## 2019-10-30 DIAGNOSIS — E781 Pure hyperglyceridemia: Secondary | ICD-10-CM | POA: Diagnosis not present

## 2019-10-30 DIAGNOSIS — E782 Mixed hyperlipidemia: Secondary | ICD-10-CM | POA: Diagnosis not present

## 2019-10-30 LAB — LIPID PANEL
Chol/HDL Ratio: 7.1 ratio — ABNORMAL HIGH (ref 0.0–5.0)
Cholesterol, Total: 198 mg/dL (ref 100–199)
HDL: 28 mg/dL — ABNORMAL LOW (ref >39)
LDL Chol Calc (NIH): 75 mg/dL (ref 0–99)
Triglycerides: 598 mg/dL (ref 0–149)
VLDL Cholesterol Cal: 95 mg/dL — ABNORMAL HIGH (ref 5–40)

## 2019-10-30 LAB — APOLIPOPROTEIN B: Apolipoprotein B: 122 mg/dL — ABNORMAL HIGH (ref <90)

## 2019-10-30 LAB — LDL CHOLESTEROL, DIRECT: LDL Direct: 101 mg/dL — ABNORMAL HIGH (ref 0–99)

## 2019-10-30 NOTE — Patient Instructions (Signed)
Medication Instructions:  Your physician recommends that you continue on your current medications as directed. Please refer to the Current Medication list given to you today.  *If you need a refill on your cardiac medications before your next appointment, please call your pharmacy*   Lab Work: FASTING labs today - lipid, direct LDL, apolipoprotein B  If you have labs (blood work) drawn today and your tests are completely normal, you will receive your results only by: Marland Kitchen MyChart Message (if you have MyChart) OR . A paper copy in the mail If you have any lab test that is abnormal or we need to change your treatment, we will call you to review the results.   Testing/Procedures: NONE   Follow-Up: At [facility], you and your health needs are our priority.  As part of our continuing mission to provide you with exceptional heart care, we have created designated Provider Care Teams.  These Care Teams include your primary Cardiologist (physician) and Advanced Practice Providers (APPs -  Physician Assistants and Nurse Practitioners) who all work together to provide you with the care you need, when you need it.  We recommend signing up for the patient portal called "MyChart".  Sign up information is provided on this After Visit Summary.  MyChart is used to connect with patients for Virtual Visits (Telemedicine).  Patients are able to view lab/test results, encounter notes, upcoming appointments, etc.  Non-urgent messages can be sent to your provider as well.   To learn more about what you can do with MyChart, go to [email or web address].    Your next appointment:   3-4 month(s)  The format for your next appointment:   In Person or Virtual  Provider:   Kirtland Bouchard [place] Hilty, MD   Other Instructions

## 2019-10-30 NOTE — Progress Notes (Signed)
LIPID CLINIC CONSULT NOTE  Chief Complaint:  High triglycerides  Primary Care Physician: Jeoffrey Massed, MD  Primary Cardiologist:  No primary care provider on file.  HPI:  Bobby Jimenez is a 39 y.o. male who is being seen today for the evaluation of high triglycerides at the request of [name], [name], MD.  This is a pleasant 39 year old male is kindly referred for evaluation management of hypertriglyceridemia.  Bobby Jimenez has a longstanding history of elevated triglycerides.  He says his uncle by birth had high triglycerides and is very thin.  He did not know much about his father since he is adopted but his birth mother may have also had high triglycerides.  She is now deceased.  Over a year ago triglycerides were noted at 745, then improved somewhat to 581 after work with a dietitian and more recently 3 months ago were elevated 837.  Direct LDL was 53 and hemoglobin H8E was 5.6.  He has no coronary disease that is notable.  He denies any history of pancreatitis.  He is on medications that could alter his triglyceride metabolism, including Abilify which is known to raise triglycerides, as well as this sertraline and to a small extent metoprolol.  He works with a Therapist, sports, Dr. Jannifer Franklin.  He reports very rare alcohol use, no more than 1-2 drinks per week.  PMHx:  Past Medical History:  Diagnosis Date   ADHD (attention deficit hyperactivity disorder)    Anxiety    Bipolar disorder (HCC)    Cervical radiculopathy    Dizziness    lightheaded   Food allergy    pineapple   GERD (gastroesophageal reflux disease)    Headache syndrome    Hepatic steatosis    u/s and CT confirmed 2020--no cirrhosis.   Hypertension    Hypertriglyceridemia    Very high (>800 while on vascepa and rosuva 06/2019)->rosuva and vascepa d/c'd and gemfibrozil started, also referred pt to advanced lipid clinic   IBS (irritable bowel syndrome)    bentyl helping ([facility])    Latex allergy    Low back pain    Mixed hyperlipidemia    OSA on CPAP 10/16/2016   (Dr. Lestine Mount)   Tremors of nervous system    arms, dx'd as lasting side effect from lithium use in remote past: psych has him on amantadine for this.   Vertigo     Past Surgical History:  Procedure Laterality Date   CHOLECYSTECTOMY N/A 12/26/2016   Procedure: LAPAROSCOPIC CHOLECYSTECTOMY;  Surgeon: Franky Macho, MD;  Location: [facility];  Service: General;  Laterality: N/A;   KNEE SURGERY Right 1997   arthroscopic   polysomnogram  09/2016   Mild/mod OSA->CPAP (Dr. Lestine Mount)   WISDOM TOOTH EXTRACTION  1997    FAMHx:  Family History  Adopted: Yes  Problem Relation Age of Onset   High blood pressure Mother    Seizures Mother    Colon cancer Neg Hx     SOCHx:   reports that he has quit smoking. His smoking use included cigarettes. He smoked 1.00 pack per day. He has quit using smokeless tobacco. He reports current alcohol use of about 1.0 standard drink of alcohol per week. He reports that he does not use drugs.  ALLERGIES:  Allergies  Allergen Reactions   Pineapple Shortness Of Breath and Swelling   Latex Swelling   Morphine And Related Hives and Itching   Statins     "locks urinary tract up"  ROS: Pertinent items noted in HPI and remainder of comprehensive ROS otherwise negative.  HOME MEDS: Current Outpatient Medications on File Prior to Visit  Medication Sig Dispense Refill   ABILIFY 10 MG tablet Take 5 mg by mouth 2 (two) times daily.      amantadine (SYMMETREL) 100 MG capsule Take 100 mg by mouth 2 (two) times daily.      clonazePAM (KLONOPIN) 0.5 MG tablet Take 0.25-0.5 mg by mouth 2 (two) times daily. Takes 1/2 tablet in am and 1 tablet at bedtime     dicyclomine (BENTYL) 10 MG capsule TAKE 1 CAPSULE BY MOUTH 4 TIMES DAILY WITH MEALS AND AT BEDTIME AS NEEDED. HOLD FOR CONSTIPATION. 120 capsule 3   fexofenadine (ALLEGRA) 180 MG tablet Take 1 tablet (180 mg  total) by mouth daily. 90 tablet 1   fluticasone (FLONASE) 50 MCG/ACT nasal spray Place 2 sprays into both nostrils daily. 16 g 6   gemfibrozil (LOPID) 600 MG tablet TAKE 1 TABLET BY MOUTH TWICE DAILY BEFORE A MEAL. 30 tablet 0   lisinopril (PRINIVIL,ZESTRIL) 10 MG tablet Take 10 mg by mouth daily.     metoprolol tartrate (LOPRESSOR) 25 MG tablet TAKE (1) TABLET BY MOUTH TWICE DAILY. 180 tablet 3   Multiple Vitamin (MULTIVITAMIN) tablet Take 1 tablet by mouth daily.     pantoprazole (PROTONIX) 40 MG tablet TAKE 1 TABLET BY MOUTH ONCE DAILY BEFORE BREAKFAST. 90 tablet 3   ZOLOFT 100 MG tablet Take 150 mg by mouth daily.      No current facility-administered medications on file prior to visit.    LABS/IMAGING: No results found for this or any previous visit (from the past 48 hour(s)). No results found.  LIPID PANEL:    Component Value Date/Time   CHOL 163 07/16/2019 0858   TRIG (H) 07/16/2019 0858    837.0 Triglyceride is over 400; calculations on Lipids are invalid.   HDL 32.40 (L) 07/16/2019 0858   CHOLHDL 5 07/16/2019 0858   LDLDIRECT 53.0 07/16/2019 0858    WEIGHTS: Wt Readings from Last 3 Encounters:  10/30/19 267 lb (121.1 kg)  07/28/19 259 lb 12.8 oz (117.8 kg)  07/09/19 264 lb 6.4 oz (119.9 kg)    VITALS: BP 124/72    Pulse 82    Ht 5\' 10"  (1.778 m)    Wt 267 lb (121.1 kg)    SpO2 97%    BMI 38.31 kg/m   EXAM: General appearance: alert and no distress Lungs: clear to auscultation bilaterally Heart: regular rate and rhythm Extremities: extremities normal, atraumatic, no cyanosis or edema Neurologic: Grossly normal  EKG: Deferred  ASSESSMENT: 1. Primary hypertriglyceridemia  PLAN: 1.   Bobby Jimenez has a primary hypertriglyceridemia and is at risk possibly for pancreatitis.  More recently he was started on gemfibrozil 600 mg twice daily.  He has not had his lipids reassessed in the past 3 months.  We will get fasting lipids today and could suggest  additional therapy such as an omega-3 to assist with this.  Also contributing to his high triglycerides are Abilify and to a lesser extent his sertraline and metoprolol.  I think it is probably unlikely for him to be able to come off of the Abilify since its helped with mood stabilization.  Will check with the psychiatrist to see if that is possible.  Otherwise, we will have to work around it.  I have also talked about dietary modifications to lower risk of pancreatitis and triglycerides including very low saturated  fats less than 20% of calories.  He is seeing Norm Salt, RD for nutritional advice and has an appointment coming up at the end of this month.  Thanks again for the kind referral.  Chrystie Nose, MD, [facility]  [place]   [facility]  Medical Director of the Advanced Lipid Disorders &  Cardiovascular Risk Reduction Clinic Diplomate of the American Board of Clinical Lipidology Attending Cardiologist  Direct Dial: [phone]   Fax: [phone]  Website:  [email or web address]  Lisette Abu Geoff Dacanay 10/30/2019, 9:22 AM

## 2019-11-03 ENCOUNTER — Telehealth: Payer: Self-pay | Admitting: Internal Medicine

## 2019-11-03 ENCOUNTER — Other Ambulatory Visit: Payer: Self-pay | Admitting: *Deleted

## 2019-11-03 DIAGNOSIS — E781 Pure hyperglyceridemia: Secondary | ICD-10-CM

## 2019-11-03 MED ORDER — ICOSAPENT ETHYL 1 G PO CAPS: 2.0000 g | ORAL_CAPSULE | Freq: Two times a day (BID) | ORAL | 11 refills | Status: DC

## 2019-11-03 NOTE — Telephone Encounter (Signed)
Pt would like a Nurse to go over his recent labs with him

## 2019-11-03 NOTE — Telephone Encounter (Signed)
Spoke with the patient who saw his lab results through MyChart and recommendations to start Vascepa. He states that he will be starting vascepa but is wondering if he is to continue gemfibrozil. I advised him that he should continue gemfibrozil along with all other current medications, vascepa is an additional medication. Patient verbalized understanding.

## 2019-11-03 NOTE — Telephone Encounter (Signed)
° °  Pt is returning call, he have additional question about medications

## 2019-11-03 NOTE — Telephone Encounter (Signed)
Follow Up:    Returning [place] call from today.

## 2019-11-03 NOTE — Telephone Encounter (Signed)
Left message for patient to call back  

## 2019-11-03 NOTE — Telephone Encounter (Signed)
Follow Up:   Pt said give him 5 minutes to  Reset his phone. He said his calls have been going to [employer]

## 2019-11-03 NOTE — Telephone Encounter (Signed)
Called patient . No answer - left message  That information was sent to his mychart account , if further question are needed to be answered please call back

## 2019-11-05 ENCOUNTER — Ambulatory Visit (INDEPENDENT_AMBULATORY_CARE_PROVIDER_SITE_OTHER): Payer: Medicare Other | Admitting: Nurse Practitioner

## 2019-11-05 ENCOUNTER — Encounter: Payer: Self-pay | Admitting: Nurse Practitioner

## 2019-11-05 ENCOUNTER — Other Ambulatory Visit: Payer: Self-pay

## 2019-11-05 VITALS — BP 130/78 | HR 90 | Temp 97.2°F | Ht 70.0 in | Wt 273.2 lb

## 2019-11-05 DIAGNOSIS — K580 Irritable bowel syndrome with diarrhea: Secondary | ICD-10-CM

## 2019-11-05 DIAGNOSIS — K219 Gastro-esophageal reflux disease without esophagitis: Secondary | ICD-10-CM | POA: Diagnosis not present

## 2019-11-05 DIAGNOSIS — K760 Fatty (change of) liver, not elsewhere classified: Secondary | ICD-10-CM | POA: Diagnosis not present

## 2019-11-05 NOTE — Patient Instructions (Signed)
Your health issues we discussed today were:   GERD (reflux/heartburn): 1. Glad you are doing well! 2. Continue taking your acid blocker as you have been 3. Let us know if you have any worsening or severe symptoms  Irritable bowel syndrome diarrhea type: 1. I am glad you are doing well with this 2. You can continue to use Bentyl as needed for "flareups" 3. Let us know if you have any worsening or severe symptoms 4. He should have continued improvement with your excellent efforts at weight loss and diet  Nonalcoholic fatty liver disease (NAFLD): 1. Congratulations on your diet, and exercise to achieve weight loss! 2. Continue working on your diet and exercise 3. Remember, weight loss is a series of victories and losses.  What matters is continuing your efforts and progress over the long-term 4. Also remember, if you do not need a "magic number" with your weight the weight loss you are having is positively affecting your health.  This is the overall goal 5. Call us if we can offer any other assistance 6. Have your labs checked when you are able to  Overall I recommend:  1. Continue other current medications 2. Return for follow-up in 6 months 3. Call us for any questions or concerns   ---------------------------------------------------------------  I am glad you have gotten your COVID-19 vaccination!  Even though you are fully vaccinated you should continue to follow CDC and state/local guidelines.  ---------------------------------------------------------------   At [facility] we value your feedback. You may receive a survey about your visit today. Please share your experience as we strive to create trusting relationships with our patients to provide genuine, compassionate, quality care.  We appreciate your understanding and patience as we review any laboratory studies, imaging, and other diagnostic tests that are ordered as we care for you. Our office policy is 5  business days for review of these results, and any emergent or urgent results are addressed in a timely manner for your best interest. If you do not hear from our office in 1 week, please contact us.   We also encourage the use of MyChart, which contains your medical information for your review as well. If you are not enrolled in this feature, an access code is on this after visit summary for your convenience. Thank you for allowing [place] to be involved in your care.  It was great to see you today!  I hope you have a great Fall!!

## 2019-11-05 NOTE — Progress Notes (Signed)
Referring Provider: Jeoffrey Massed, MD Primary Care Physician:  Jeoffrey Massed, MD Primary GI:  Dr. Jena Gauss  Chief Complaint  Patient presents with  . Gastroesophageal Reflux    doing fine  . Irritable Bowel Syndrome    occas flare of diarrhea    HPI:   Bobby Jimenez is a 39 y.o. male who presents for follow-up on IBS and GERD. The patient was last seen in our office 05/05/19 for GERD, IBS-D, and fatty liver disease.  At that time noted history of GERD and possible IBS.  GERD previously well managed on PPI and IBS previously well managed on Bentyl.  Abdominal ultrasound in September 2020 with marked hepatic steatosis and mildly coarsened echotexture raising concern about background liver disease.  CT follow-up in October 2025 hepatic steatosis no focal lesion, no specific stigmata of hepatic cirrhosis.  Overall felt to be fatty liver disease.  Subsequent discussion noted high risk for liver disease including central obesity, hypertension, "real bad" cholesterol.  However, no history of diabetes.  He was referred to a nutritionist and was seen by them in January of this year, appeared motivated to change his lifestyle habits.  At his last visit noted a good experience with the dietitian.  Now only having "occasional twinge" and continues on Bentyl 3 times a day.  His symptoms flare about once a month, not bad and self resolved within a couple seconds.  Diarrhea has subsided as well.  He is status post cholecystectomy.  GERD well managed on PPI.  No other overt GI complaints.  He is more physically active, sleeping better and feeling better.  He was congratulated on his successes with lifestyle modification.  He was encouraged to continue working at this.  Recommended continue Bentyl, notify us of worsening symptoms, continue PPI, continue diet and exercise/lifestyle modification, follow-up in 6 months.  Today he states is doing okay overall.  He is a little upset he's put about 5 lbs  back on; he is motivated to get back into good habits. GERD symptoms are doing well on PPI.  With his IBS he has occasional flare of diarrhea, but generally well managed with Bentyl. This occurs about once a month, brief and "not too bad." Otherwise denies abdominal pain, N/V, hematochezia, melena, fever, chills, unintentional weight loss. Denies URI or flu-like symptoms. Denies loss of sense of taste or smell. The patient has received COVID-19 vaccination(s). Denies chest pain, dyspnea, dizziness, lightheadedness, syncope, near syncope. Denies any other upper or lower GI symptoms.  Past Medical History:  Diagnosis Date  . ADHD (attention deficit hyperactivity disorder)   . Anxiety   . Bipolar disorder (HCC)   . Cervical radiculopathy   . Dizziness    lightheaded  . Food allergy    pineapple  . GERD (gastroesophageal reflux disease)   . Headache syndrome   . Hepatic steatosis    u/s and CT confirmed 2020--no cirrhosis.  . Hypertension   . Hypertriglyceridemia    Very high (>800 while on vascepa and rosuva 06/2019)->rosuva and vascepa d/c'd and gemfibrozil started, also referred pt to advanced lipid clinic  . IBS (irritable bowel syndrome)    bentyl helping ([facility])  . Latex allergy   . Low back pain   . Mixed hyperlipidemia   . OSA on CPAP 10/16/2016   (Dr. Lestine Mount)  . Tremors of nervous system    arms, dx'd as lasting side effect from lithium use in remote past: psych has him on  amantadine for this.  . Vertigo     Past Surgical History:  Procedure Laterality Date  . CHOLECYSTECTOMY N/A 12/26/2016   Procedure: LAPAROSCOPIC CHOLECYSTECTOMY;  Surgeon: Franky Macho, MD;  Location: [facility];  Service: General;  Laterality: N/A;  . KNEE SURGERY Right 1997   arthroscopic  . polysomnogram  09/2016   Mild/mod OSA->CPAP (Dr. Lestine Mount)  . WISDOM TOOTH EXTRACTION  1997    Current Outpatient Medications  Medication Sig Dispense Refill  . ABILIFY 10 MG tablet Take 5 mg by  mouth 2 (two) times daily.     Marland Kitchen amantadine (SYMMETREL) 100 MG capsule Take 100 mg by mouth 2 (two) times daily.     . clonazePAM (KLONOPIN) 0.5 MG tablet Take 0.25-0.5 mg by mouth 2 (two) times daily. Takes 1/2 tablet in am and 1 tablet at bedtime    . dicyclomine (BENTYL) 10 MG capsule TAKE 1 CAPSULE BY MOUTH 4 TIMES DAILY WITH MEALS AND AT BEDTIME AS NEEDED. HOLD FOR CONSTIPATION. (Patient taking differently: in the morning, at noon, and at bedtime. ) 120 capsule 3  . fexofenadine (ALLEGRA) 180 MG tablet Take 1 tablet (180 mg total) by mouth daily. 90 tablet 1  . fluticasone (FLONASE) 50 MCG/ACT nasal spray Place 2 sprays into both nostrils daily. 16 g 6  . gemfibrozil (LOPID) 600 MG tablet TAKE 1 TABLET BY MOUTH TWICE DAILY BEFORE A MEAL. 30 tablet 0  . icosapent Ethyl (VASCEPA) 1 g capsule Take 2 capsules (2 g total) by mouth 2 (two) times daily. 120 capsule 11  . lisinopril (PRINIVIL,ZESTRIL) 10 MG tablet Take 10 mg by mouth daily.    . metoprolol tartrate (LOPRESSOR) 25 MG tablet TAKE (1) TABLET BY MOUTH TWICE DAILY. 180 tablet 3  . Multiple Vitamin (MULTIVITAMIN) tablet Take 1 tablet by mouth daily.    . pantoprazole (PROTONIX) 40 MG tablet TAKE 1 TABLET BY MOUTH ONCE DAILY BEFORE BREAKFAST. 90 tablet 3  . ZOLOFT 100 MG tablet Take 150 mg by mouth daily.      No current facility-administered medications for this visit.    Allergies as of 11/05/2019 - Review Complete 11/05/2019  Allergen Reaction Noted  . Pineapple Shortness Of Breath and Swelling 05/14/2014  . Latex Swelling 05/14/2014  . Morphine and related Hives and Itching 05/14/2014  . Statins  12/30/2018    Family History  Adopted: Yes  Problem Relation Age of Onset  . High blood pressure Mother   . Seizures Mother   . Colon cancer Neg Hx     Social History   Socioeconomic History  . Marital status: Divorced    Spouse name: Not on file  . Number of children: 0  . Years of education: 68  . Highest education level:  Not on file  Occupational History  . Not on file  Tobacco Use  . Smoking status: Former Smoker    Packs/day: 1.00    Types: Cigarettes  . Smokeless tobacco: Former Neurosurgeon  . Tobacco comment: quit Nov 2018  Vaping Use  . Vaping Use: Never used  Substance and Sexual Activity  . Alcohol use: Yes    Alcohol/week: 1.0 standard drink    Types: 1 Cans of beer per week    Comment: heavy in past, short period of time in 20s.; 05/05/19 occasional about 1 beer a month  . Drug use: No    Comment: Quit 1996  . Sexual activity: Not on file  Other Topics Concern  . Not on file  Social  History Narrative   Divorced, no children.   Educ: college at [employer]   Patient lives at home with his father and unemployed.  Disability for psych reasons.   Education some college.   Both handed.   Caffeine three cups daily soda and coffee.   Alc: occ beer (remote hx of alc abuse, marijuana, acid--no IV drugs) in 2005-2006, surround his problems coping with GF's death.   Tobacco: former cig smoker, 20 pack-yr hx , quit 2018.   Social Determinants of Health   Financial Resource Strain:   . Difficulty of Paying Living Expenses: Not on file  Food Insecurity:   . Worried About Programme researcher, broadcasting/film/videounning Out of Food in the Last Year: Not on file  . Ran Out of Food in the Last Year: Not on file  Transportation Needs:   . Lack of Transportation (Medical): Not on file  . Lack of Transportation (Non-Medical): Not on file  Physical Activity:   . Days of Exercise per Week: Not on file  . Minutes of Exercise per Session: Not on file  Stress:   . Feeling of Stress : Not on file  Social Connections:   . Frequency of Communication with Friends and Family: Not on file  . Frequency of Social Gatherings with Friends and Family: Not on file  . Attends Religious Services: Not on file  . Active Member of Clubs or Organizations: Not on file  . Attends BankerClub or Organization Meetings: Not on file  . Marital Status: Not on file     Subjective: Review of Systems  Constitutional: Negative for chills, fever, malaise/fatigue and weight loss.  HENT: Negative for congestion and sore throat.   Respiratory: Negative for cough and shortness of breath.   Cardiovascular: Negative for chest pain and palpitations.  Gastrointestinal: Positive for abdominal pain (Rare) and diarrhea (Rare). Negative for blood in stool, constipation, heartburn, melena, nausea and vomiting.  Musculoskeletal: Negative for joint pain and myalgias.  Skin: Negative for rash.  Neurological: Negative for dizziness and weakness.  Endo/Heme/Allergies: Does not bruise/bleed easily.  Psychiatric/Behavioral: Negative for depression. The patient is not nervous/anxious.   All other systems reviewed and are negative.    Objective: BP 130/78   Pulse 90   Temp (!) 97.2 F (36.2 C) (Oral)   Ht 5\' 10"  (1.778 m)   Wt 273 lb 3.2 oz (123.9 kg)   BMI 39.20 kg/m  Physical Exam Vitals and nursing note reviewed.  Constitutional:      General: He is not in acute distress.    Appearance: Normal appearance. He is obese. He is not ill-appearing, toxic-appearing or diaphoretic.  HENT:     Head: Normocephalic and atraumatic.     Nose: No congestion or rhinorrhea.  Eyes:     General: No scleral icterus. Cardiovascular:     Rate and Rhythm: Normal rate and regular rhythm.     Heart sounds: Normal heart sounds.  Pulmonary:     Effort: Pulmonary effort is normal.     Breath sounds: Normal breath sounds.  Abdominal:     General: Bowel sounds are normal. There is no distension.     Palpations: Abdomen is soft. There is no hepatomegaly, splenomegaly or mass.     Tenderness: There is no abdominal tenderness. There is no guarding or rebound.     Hernia: No hernia is present.  Musculoskeletal:     Cervical back: Neck supple.  Skin:    General: Skin is warm and dry.     Coloration:  Skin is not jaundiced.     Findings: No bruising or rash.  Neurological:      General: No focal deficit present.     Mental Status: He is alert and oriented to person, place, and time. Mental status is at baseline.  Psychiatric:        Mood and Affect: Mood normal.        Behavior: Behavior normal.        Thought Content: Thought content normal.      Assessment:  Very pleasant 39 year old male presents for follow-up on GERD, IBS, NAFLD.  Liver enzymes last checked about 4 months ago and had normalized.  He has been making great strides towards diet, exercise, and weight loss.  Today he does note he is put on about 5 pounds but overall he is still down by several pounds.  I congratulated him on his efforts and encouraged him to "get back on the horse" and keep going.  I reminded him how well he felt with weight loss.  GERD: Currently doing quite well on PPI.  No significant breakthrough.  I will have him just continue his current PPI therapy  IBS diarrhea type: Doing well on Bentyl as needed, has a flare about once a month which she describes as "not bad" and brief.  Bentyl does help him get over these flares.  Generally last less than a day.  Recommend continue medications and call for worsening symptoms  NAFLD: Liver enzymes have normalized.  Liver parenchyma evaluated by cross-sectional imaging and felt hepatic steatosis, no underlying findings concerning for cirrhosis.  I will recheck his liver enzymes today.  Recommend continue diet and exercise for weight loss.  He is high risk for metabolic syndrome.   Plan: 1. Continue medications, specifically Bentyl and PPI 2. CBC, CMP 3. Continue diet and exercise for weight loss, blood pressure control, glycemic control, cholesterol control to reduce and eliminate NAFLD 4. Follow-up in 6 months    Thank you for allowing [place] to participate in the care of Lorenz Coaster, DNP, AGNP-C Adult & Gerontological Nurse Practitioner [facility]   11/05/2019 1:49 PM   Disclaimer:  This note was dictated with voice recognition software. Similar sounding words can inadvertently be transcribed and may not be corrected upon review.

## 2019-11-05 NOTE — Progress Notes (Signed)
Cc'ed to pcp °

## 2019-11-06 LAB — COMPREHENSIVE METABOLIC PANEL
AG Ratio: 1.8 (calc) (ref 1.0–2.5)
ALT: 33 U/L (ref 9–46)
AST: 30 U/L (ref 10–40)
Albumin: 4.8 g/dL (ref 3.6–5.1)
Alkaline phosphatase (APISO): 55 U/L (ref 36–130)
BUN: 18 mg/dL (ref 7–25)
CO2: 26 mmol/L (ref 20–32)
Calcium: 10.4 mg/dL — ABNORMAL HIGH (ref 8.6–10.3)
Chloride: 102 mmol/L (ref 98–110)
Creat: 1 mg/dL (ref 0.60–1.35)
Globulin: 2.6 g/dL (calc) (ref 1.9–3.7)
Glucose, Bld: 109 mg/dL (ref 65–139)
Potassium: 5.1 mmol/L (ref 3.5–5.3)
Sodium: 138 mmol/L (ref 135–146)
Total Bilirubin: 0.4 mg/dL (ref 0.2–1.2)
Total Protein: 7.4 g/dL (ref 6.1–8.1)

## 2019-11-06 LAB — CBC WITH DIFFERENTIAL/PLATELET
Absolute Monocytes: 522 cells/uL (ref 200–950)
Basophils Absolute: 17 cells/uL (ref 0–200)
Basophils Relative: 0.3 %
Eosinophils Absolute: 110 cells/uL (ref 15–500)
Eosinophils Relative: 1.9 %
HCT: 39.2 % (ref 38.5–50.0)
Hemoglobin: 13.9 g/dL (ref 13.2–17.1)
Lymphs Abs: 1282 cells/uL (ref 850–3900)
MCH: 31.7 pg (ref 27.0–33.0)
MCHC: 35.5 g/dL (ref 32.0–36.0)
MCV: 89.3 fL (ref 80.0–100.0)
MPV: 10.4 fL (ref 7.5–12.5)
Monocytes Relative: 9 %
Neutro Abs: 3869 cells/uL (ref 1500–7800)
Neutrophils Relative %: 66.7 %
Platelets: 249 10*3/uL (ref 140–400)
RBC: 4.39 10*6/uL (ref 4.20–5.80)
RDW: 14.2 % (ref 11.0–15.0)
Total Lymphocyte: 22.1 %
WBC: 5.8 10*3/uL (ref 3.8–10.8)

## 2019-11-16 DIAGNOSIS — F902 Attention-deficit hyperactivity disorder, combined type: Secondary | ICD-10-CM | POA: Diagnosis not present

## 2019-11-16 DIAGNOSIS — F4011 Social phobia, generalized: Secondary | ICD-10-CM | POA: Diagnosis not present

## 2019-11-16 DIAGNOSIS — F3132 Bipolar disorder, current episode depressed, moderate: Secondary | ICD-10-CM | POA: Diagnosis not present

## 2019-11-20 ENCOUNTER — Other Ambulatory Visit: Payer: Self-pay | Admitting: Family Medicine

## 2019-11-20 NOTE — Telephone Encounter (Signed)
Please advise if refill appropriate, thanks. 

## 2019-11-21 ENCOUNTER — Encounter: Payer: Self-pay | Admitting: Family Medicine

## 2019-11-25 ENCOUNTER — Encounter: Payer: Medicare Other | Attending: Family Medicine | Admitting: Nutrition

## 2019-11-25 ENCOUNTER — Encounter: Payer: Self-pay | Admitting: Nutrition

## 2019-11-25 ENCOUNTER — Other Ambulatory Visit: Payer: Self-pay

## 2019-11-25 VITALS — Ht 70.0 in | Wt 260.0 lb

## 2019-11-25 DIAGNOSIS — E781 Pure hyperglyceridemia: Secondary | ICD-10-CM

## 2019-11-25 DIAGNOSIS — K760 Fatty (change of) liver, not elsewhere classified: Secondary | ICD-10-CM

## 2019-11-25 DIAGNOSIS — I10 Essential (primary) hypertension: Secondary | ICD-10-CM

## 2019-11-25 DIAGNOSIS — E782 Mixed hyperlipidemia: Secondary | ICD-10-CM | POA: Insufficient documentation

## 2019-11-25 DIAGNOSIS — E669 Obesity, unspecified: Secondary | ICD-10-CM

## 2019-11-25 NOTE — Patient Instructions (Signed)
Keep up the good job! Aim for 25 g fiber per day Take meals to work when start new job. Drink water Walk 2 miles per day when able. Lose 2-3 lbs per month Avoid simple sugars, desserts, pastries, fast foods and processed foods. Continue eating lean meats and fresh or frozen fruits and vegetables.

## 2019-11-25 NOTE — Progress Notes (Signed)
Phone visit Medical Nutrition Therapy:  Appt start time: 1415 end time:  1445  Assessment:  Primary concerns today: Obesity, Hypertriglyceridemia, and Hyperlipidemia.Marland Kitchen PMH: HTN, Hyperlipidemia, Sleep apnea, Anxiety and GERD. He has been eating more vegetables, more fresh fruits. Feels a lot better. Hasn't had any stomach pains. Cut out sodas, drinking water and occasionally a  Gatorade.  Cut out fried and fast foods. His Cardiologist told him some of his depression meds may effect his TG so they're going to keep an eye on them for now. His Cardiologist has him on Vascepa for TG and Genfibrozil. Cut out fast foods and processed foods. 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 Has lost 7 lbs since last visit. Strong family history of elevated TG and Lipids. GI Dr. Catalina Pizza him his fatty liver has improved.    CMP Latest Ref Rng & Units 11/05/2019 07/16/2019 01/09/2019  Glucose 65 - 139 mg/dL 497 026(V) 96  BUN 7 - 25 mg/dL 18 14 17   Creatinine 0.60 - 1.35 mg/dL 7.85 8.85  Sodium 135 - 146 mmol/L 138 138 139  Potassium 3.5 - 5.3 mmol/L 5.1 4.2 4.0  Chloride 98 - 110 mmol/L 102 101 106  CO2 20 - 32 mmol/L 26 29 24   Calcium 8.6 - 10.3 mg/dL 10.4(H) 9.3 9.4  Total Protein 6.1 - 8.1 g/dL 7.4 7.1 7.1  Total Bilirubin 0.2 - 1.2 mg/dL 0.4 0.4 0.4  Alkaline Phos 39 - 117 U/L - 65 77  AST 10 - 40 U/L 30 17 21   ALT 9 - 46 U/L 33 23 37   Lipid Panel     Component Value Date/Time   CHOL 198 10/30/2019 0956   TRIG 598 (HH) 10/30/2019 0956   HDL 28 (L) 10/30/2019 0956   CHOLHDL 7.1 (H) 10/30/2019 0956   CHOLHDL 5 07/16/2019 0858   LDLCALC 75 10/30/2019 0956   LDLDIRECT 101 (H) 10/30/2019 0956   LDLDIRECT 53.0 07/16/2019 0858   LABVLDL 95 (H) 10/30/2019 0956     Preferred Learning Style:  No preference indicated   Learning Readiness:    Change in progress   MEDICATIONS:    DIETARY INTAKE:     24-hr recall:  B ( AM): Raisin bran and egg or frosted mini wheat, fruit,  Snk  ( AM):   L ( PM): Grilled chicken and salad, milk, water Snk ( PM):  D ( PM): steak, spring mix and water  Snk ( PM):  Beverages:  water.  Usual physical activity:  ADL,   Estimated energy needs: 1800 calories 200 g carbohydrates 135 g protein 50 g fat  Progress Towards Goal(s):  In progress.   Nutritional Diagnosis:  NB-1.1 Food and nutrition-related knowledge deficit As related to Obesity, hyperlipidemia.  As evidenced by BMI > 30 and TG > 500.    Intervention Weight loss education and Hyperlipidemia education provided on My Plate, CHO counting, meal planning, portion sizes, timing of meals, avoiding snacks between meals, ttaking medications as prescribed, benefits of exercising 60 minutes per day and prevention of DM. Cut out sweet tea and processed foods.Goals   Goals Keep up the good job! Aim for 25 g fiber per day Take meals to work when start new job. Drink water Walk 2 miles per day when able. Lose 2-3 lbs per month Avoid simple sugars, desserts, pastries, fast foods and processed foods. Continue eating lean meats and fresh or frozen fruits and vegetables.  . Teaching Method Utilized:  Visual Auditory Hands on  Handouts given during visit include:  The Plate Method   Meal Plan Card  Weight loss tips.     Barriers to learning/adherence to lifestyle change: none  Demonstrated degree of understanding via:  Teach Back   Monitoring/Evaluation:  Dietary intake, exercise,and body weight 4 month. Marland Kitchen

## 2019-12-15 ENCOUNTER — Telehealth: Payer: Self-pay | Admitting: Family Medicine

## 2019-12-15 DIAGNOSIS — G4730 Sleep apnea, unspecified: Secondary | ICD-10-CM

## 2019-12-15 NOTE — Telephone Encounter (Signed)
Patient is requesting refills of his CPAP supplies. Please fax to [facility], fax# [phone].

## 2019-12-15 NOTE — Telephone Encounter (Signed)
Please advise, pt last seen 07/09/19 and is not established with pulmonary.

## 2019-12-16 NOTE — Telephone Encounter (Signed)
Spoke with pt and he understands that it is something that a specialist will need to monitor. He understands that [facility] will be calling to schedule him to establish.

## 2019-12-16 NOTE — Telephone Encounter (Signed)
Patient calling back regarding cpap supplies and to speak with Erie Noe, he spoke to her yesterday.  Patient is saying he doesn't understand why he cant get cpap supplies refilled because Dr. Milinda Cave has done it before.  Vanessa-please call patient back this afternoon. [phone]. Thank you bunches!

## 2019-12-16 NOTE — Telephone Encounter (Signed)
His sleep study was by Dr. Juanetta Gosling in 2019, but Dr. Juanetta Gosling has retired. He needs to get established with new sleep specialist in order to get his supplies RFd. Pls order referral to [facility] pulmonology, dx OSA on CPAP.-thx

## 2019-12-16 NOTE — Telephone Encounter (Signed)
Placed referral to [facility]- 

## 2019-12-16 NOTE — Telephone Encounter (Signed)
Attempted to call pt back and lvm for pt to return call.

## 2019-12-22 ENCOUNTER — Other Ambulatory Visit: Payer: Self-pay | Admitting: Internal Medicine

## 2019-12-28 ENCOUNTER — Other Ambulatory Visit: Payer: Self-pay | Admitting: Internal Medicine

## 2019-12-28 MED ORDER — ICOSAPENT ETHYL 1 G PO CAPS
2.0000 g | ORAL_CAPSULE | Freq: Two times a day (BID) | ORAL | 3 refills | Status: DC
Start: 2019-12-28 — End: 2021-01-31

## 2019-12-30 ENCOUNTER — Telehealth: Payer: Self-pay | Admitting: Family Medicine

## 2019-12-30 ENCOUNTER — Other Ambulatory Visit: Payer: Self-pay | Admitting: *Deleted

## 2019-12-30 DIAGNOSIS — E781 Pure hyperglyceridemia: Secondary | ICD-10-CM

## 2019-12-30 NOTE — Telephone Encounter (Signed)
Left message for patient to schedule Annual Wellness Visit.  Please schedule with Nurse Health Advisor Martha Stanley, RN at [facility] [place]  °

## 2020-01-07 DIAGNOSIS — F902 Attention-deficit hyperactivity disorder, combined type: Secondary | ICD-10-CM | POA: Diagnosis not present

## 2020-01-07 DIAGNOSIS — F3132 Bipolar disorder, current episode depressed, moderate: Secondary | ICD-10-CM | POA: Diagnosis not present

## 2020-01-07 DIAGNOSIS — F4011 Social phobia, generalized: Secondary | ICD-10-CM | POA: Diagnosis not present

## 2020-01-09 ENCOUNTER — Other Ambulatory Visit: Payer: Self-pay | Admitting: Family Medicine

## 2020-01-09 NOTE — Telephone Encounter (Signed)
Is this okay?

## 2020-01-18 DIAGNOSIS — H906 Mixed conductive and sensorineural hearing loss, bilateral: Secondary | ICD-10-CM | POA: Diagnosis not present

## 2020-01-18 DIAGNOSIS — H838X3 Other specified diseases of inner ear, bilateral: Secondary | ICD-10-CM | POA: Diagnosis not present

## 2020-01-18 DIAGNOSIS — H6121 Impacted cerumen, right ear: Secondary | ICD-10-CM | POA: Diagnosis not present

## 2020-01-18 DIAGNOSIS — H7202 Central perforation of tympanic membrane, left ear: Secondary | ICD-10-CM | POA: Diagnosis not present

## 2020-01-18 DIAGNOSIS — E781 Pure hyperglyceridemia: Secondary | ICD-10-CM | POA: Diagnosis not present

## 2020-01-19 LAB — LIPID PANEL
Chol/HDL Ratio: 5.8 ratio — ABNORMAL HIGH (ref 0.0–5.0)
Cholesterol, Total: 168 mg/dL (ref 100–199)
HDL: 29 mg/dL — ABNORMAL LOW (ref >39)
LDL Chol Calc (NIH): 82 mg/dL (ref 0–99)
Triglycerides: 348 mg/dL — ABNORMAL HIGH (ref 0–149)
VLDL Cholesterol Cal: 57 mg/dL — ABNORMAL HIGH (ref 5–40)

## 2020-01-19 LAB — APOLIPOPROTEIN B: Apolipoprotein B: 107 mg/dL — ABNORMAL HIGH (ref <90)

## 2020-01-25 ENCOUNTER — Other Ambulatory Visit: Payer: Self-pay | Admitting: Internal Medicine

## 2020-01-27 ENCOUNTER — Ambulatory Visit (INDEPENDENT_AMBULATORY_CARE_PROVIDER_SITE_OTHER): Payer: Medicare Other

## 2020-01-27 VITALS — Ht 70.0 in | Wt 260.0 lb

## 2020-01-27 DIAGNOSIS — Z0000 Encounter for general adult medical examination without abnormal findings: Secondary | ICD-10-CM | POA: Diagnosis not present

## 2020-01-27 NOTE — Patient Instructions (Signed)
Bobby Jimenez , Thank you for taking time to complete your Medicare Wellness Visit. I appreciate your ongoing commitment to your health goals. Please review the following plan we discussed and let me know if I can assist you in the future.   Screening recommendations/referrals: Colonoscopy: Not yet indicated Recommended yearly ophthalmology/optometry visit for glaucoma screening and checkup Recommended yearly dental visit for hygiene and checkup  Vaccinations: Influenza vaccine: Declined Pneumococcal vaccine: Up to date Tdap vaccine: Up to date-Due-01/30/2023 Shingles vaccine: Not yet indicated Covid-19: Completed vaccines  Advanced directives: Information mailed  Conditions/risks identified: See problem list  Next appointment: Follow up in one year for your annual wellness visit   Preventive Care 40-64 Years, Male Preventive care refers to lifestyle choices and visits with your health care provider that can promote health and wellness. What does preventive care include?  A yearly physical exam. This is also called an annual well check.  Dental exams once or twice a year.  Routine eye exams. Ask your health care provider how often you should have your eyes checked.  Personal lifestyle choices, including:  Daily care of your teeth and gums.  Regular physical activity.  Eating a healthy diet.  Avoiding tobacco and drug use.  Limiting alcohol use.  Practicing safe sex.  Taking low-dose aspirin every day starting at age 49. What happens during an annual well check? The services and screenings done by your health care provider during your annual well check will depend on your age, overall health, lifestyle risk factors, and family history of disease. Counseling  Your health care provider may ask you questions about your:  Alcohol use.  Tobacco use.  Drug use.  Emotional well-being.  Home and relationship well-being.  Sexual activity.  Eating habits.  Work  and work Astronomer. Screening  You may have the following tests or measurements:  Height, weight, and BMI.  Blood pressure.  Lipid and cholesterol levels. These may be checked every 5 years, or more frequently if you are over 72 years old.  Skin check.  Lung cancer screening. You may have this screening every year starting at age 23 if you have a 30-pack-year history of smoking and currently smoke or have quit within the past 15 years.  Fecal occult blood test (FOBT) of the stool. You may have this test every year starting at age 22.  Flexible sigmoidoscopy or colonoscopy. You may have a sigmoidoscopy every 5 years or a colonoscopy every 10 years starting at age 52.  Prostate cancer screening. Recommendations will vary depending on your family history and other risks.  Hepatitis C blood test.  Hepatitis B blood test.  Sexually transmitted disease (STD) testing.  Diabetes screening. This is done by checking your blood sugar (glucose) after you have not eaten for a while (fasting). You may have this done every 1-3 years. Discuss your test results, treatment options, and if necessary, the need for more tests with your health care provider. Vaccines  Your health care provider may recommend certain vaccines, such as:  Influenza vaccine. This is recommended every year.  Tetanus, diphtheria, and acellular pertussis (Tdap, Td) vaccine. You may need a Td booster every 10 years.  Zoster vaccine. You may need this after age 51.  Pneumococcal 13-valent conjugate (PCV13) vaccine. You may need this if you have certain conditions and have not been vaccinated.  Pneumococcal polysaccharide (PPSV23) vaccine. You may need one or two doses if you smoke cigarettes or if you have certain conditions. Talk to your health  care provider about which screenings and vaccines you need and how often you need them. This information is not intended to replace advice given to you by your health care  provider. Make sure you discuss any questions you have with your health care provider. Document Released: 02/11/2015 Document Revised: 10/05/2015 Document Reviewed: 11/16/2014 Elsevier Interactive Patient Education  2017 [employer].  Fall Prevention in the Home Falls can cause injuries. They can happen to people of all ages. There are many things you can do to make your home safe and to help prevent falls. What can I do on the outside of my home?  Regularly fix the edges of walkways and driveways and fix any cracks.  Remove anything that might make you trip as you walk through a door, such as a raised step or threshold.  Trim any bushes or trees on the path to your home.  Use bright outdoor lighting.  Clear any walking paths of anything that might make someone trip, such as rocks or tools.  Regularly check to see if handrails are loose or broken. Make sure that both sides of any steps have handrails.  Any raised decks and porches should have guardrails on the edges.  Have any leaves, snow, or ice cleared regularly.  Use sand or salt on walking paths during winter.  Clean up any spills in your garage right away. This includes oil or grease spills. What can I do in the bathroom?  Use night lights.  Install grab bars by the toilet and in the tub and shower. Do not use towel bars as grab bars.  Use non-skid mats or decals in the tub or shower.  If you need to sit down in the shower, use a plastic, non-slip stool.  Keep the floor dry. Clean up any water that spills on the floor as soon as it happens.  Remove soap buildup in the tub or shower regularly.  Attach bath mats securely with double-sided non-slip rug tape.  Do not have throw rugs and other things on the floor that can make you trip. What can I do in the bedroom?  Use night lights.  Make sure that you have a light by your bed that is easy to reach.  Do not use any sheets or blankets that are too big for your  bed. They should not hang down onto the floor.  Have a firm chair that has side arms. You can use this for support while you get dressed.  Do not have throw rugs and other things on the floor that can make you trip. What can I do in the kitchen?  Clean up any spills right away.  Avoid walking on wet floors.  Keep items that you use a lot in easy-to-reach places.  If you need to reach something above you, use a strong step stool that has a grab bar.  Keep electrical cords out of the way.  Do not use floor polish or wax that makes floors slippery. If you must use wax, use non-skid floor wax.  Do not have throw rugs and other things on the floor that can make you trip. What can I do with my stairs?  Do not leave any items on the stairs.  Make sure that there are handrails on both sides of the stairs and use them. Fix handrails that are broken or loose. Make sure that handrails are as long as the stairways.  Check any carpeting to make sure that it is firmly  attached to the stairs. Fix any carpet that is loose or worn.  Avoid having throw rugs at the top or bottom of the stairs. If you do have throw rugs, attach them to the floor with carpet tape.  Make sure that you have a light switch at the top of the stairs and the bottom of the stairs. If you do not have them, ask someone to add them for you. What else can I do to help prevent falls?  Wear shoes that:  Do not have high heels.  Have rubber bottoms.  Are comfortable and fit you well.  Are closed at the toe. Do not wear sandals.  If you use a stepladder:  Make sure that it is fully opened. Do not climb a closed stepladder.  Make sure that both sides of the stepladder are locked into place.  Ask someone to hold it for you, if possible.  Clearly mark and make sure that you can see:  Any grab bars or handrails.  First and last steps.  Where the edge of each step is.  Use tools that help you move around (mobility  aids) if they are needed. These include:  Canes.  Walkers.  Scooters.  Crutches.  Turn on the lights when you go into a dark area. Replace any light bulbs as soon as they burn out.  Set up your furniture so you have a clear path. Avoid moving your furniture around.  If any of your floors are uneven, fix them.  If there are any pets around you, be aware of where they are.  Review your medicines with your doctor. Some medicines can make you feel dizzy. This can increase your chance of falling. Ask your doctor what other things that you can do to help prevent falls. This information is not intended to replace advice given to you by your health care provider. Make sure you discuss any questions you have with your health care provider. Document Released: 11/11/2008 Document Revised: 06/23/2015 Document Reviewed: 02/19/2014 Elsevier Interactive Patient Education  2017 [employer].

## 2020-01-27 NOTE — Progress Notes (Signed)
Subjective:   Bobby Jimenez is a 39 y.o. male who presents for Medicare Annual/Subsequent preventive examination.  I connected with Zayd today by telephone and verified that I am speaking with the correct person using two identifiers. Location patient: home Location provider: work Persons participating in the virtual visit: patient, Engineer, civil (consulting).    I discussed the limitations, risks, security and privacy concerns of performing an evaluation and management service by telephone and the availability of in person appointments. I also discussed with the patient that there may be a patient responsible charge related to this service. The patient expressed understanding and verbally consented to this telephonic visit.    Interactive audio and video telecommunications were attempted between this provider and patient, however failed, due to patient having technical difficulties OR patient did not have access to video capability.  We continued and completed visit with audio only.  Some vital signs may be absent or patient reported.   Time Spent with patient on telephone encounter: 20 minutes   Review of Systems     Cardiac Risk Factors include: male gender;hypertension     Objective:    Today's Vitals   01/27/20 1031  Weight: 260 lb (117.9 kg)  Height: 5\' 10"  (1.778 m)   Body mass index is 37.31 kg/m.  Advanced Directives 01/27/2020 12/26/2016 12/23/2016 12/23/2016 12/22/2014 09/05/2014 08/03/2014  Does Patient Have a Medical Advance Directive? No No No No No No No  Would patient like information on creating a medical advance directive? Yes (MAU/Ambulatory/Procedural Areas - Information given) No - Patient declined No - Patient declined - No - patient declined information No - patient declined information No - patient declined information    Current Medications (verified) Outpatient Encounter Medications as of 01/27/2020  Medication Sig  . ABILIFY 10 MG tablet Take 5 mg by mouth 2  (two) times daily.   01/29/2020 amantadine (SYMMETREL) 100 MG capsule Take 100 mg by mouth 2 (two) times daily.   . clonazePAM (KLONOPIN) 0.5 MG tablet Take 0.25-0.5 mg by mouth 2 (two) times daily. Takes 1/2 tablet in am and 1 tablet at bedtime  . dicyclomine (BENTYL) 10 MG capsule TAKE 1 CAPSULE BY MOUTH 4 TIMES DAILY WITH MEALS AND AT BEDTIME AS NEEDED. HOLD FOR CONSTIPATION. (Patient taking differently: in the morning, at noon, and at bedtime.)  . fexofenadine (ALLEGRA) 180 MG tablet Take 1 tablet (180 mg total) by mouth daily.  . fluticasone (FLONASE) 50 MCG/ACT nasal spray Place 2 sprays into both nostrils daily.  Marland Kitchen gemfibrozil (LOPID) 600 MG tablet TAKE 1 TABLET BY MOUTH TWICE DAILY BEFORE A MEAL.  Marland Kitchen icosapent Ethyl (VASCEPA) 1 g capsule Take 2 capsules (2 g total) by mouth 2 (two) times daily.  Marland Kitchen lisinopril (ZESTRIL) 10 MG tablet TAKE 1 TABLET BY MOUTH ONCE A DAY.  . metoprolol tartrate (LOPRESSOR) 25 MG tablet TAKE (1) TABLET BY MOUTH TWICE DAILY.  . Multiple Vitamin (MULTIVITAMIN) tablet Take 1 tablet by mouth daily.  . pantoprazole (PROTONIX) 40 MG tablet TAKE 1 TABLET BY MOUTH ONCE DAILY BEFORE BREAKFAST.  Marland Kitchen ZOLOFT 100 MG tablet Take 150 mg by mouth daily.    No facility-administered encounter medications on file as of 01/27/2020.    Allergies (verified) Pineapple, Latex, Morphine and related, and Statins   History: Past Medical History:  Diagnosis Date  . ADHD (attention deficit hyperactivity disorder)   . Anxiety   . Bipolar disorder (HCC)   . Cervical radiculopathy   . Dizziness    lightheaded  .  Food allergy    pineapple  . GERD (gastroesophageal reflux disease)   . Headache syndrome   . Hepatic steatosis    u/s and CT confirmed 2020--no cirrhosis.  . Hypertension   . Hypertriglyceridemia    Very high (>800 while on vascepa and rosuva 06/2019)->rosuva and vascepa d/c'd and gemfibrozil started, also referred pt to advanced lipid clinic  . IBS (irritable bowel syndrome)     D.  bentyl helping ([facility])  . Latex allergy   . Low back pain   . Mixed hyperlipidemia   . OSA on CPAP 10/16/2016   (Dr. Lestine Mount)  . Tremors of nervous system    arms, dx'd as lasting side effect from lithium use in remote past: psych has him on amantadine for this.  . Vertigo    Past Surgical History:  Procedure Laterality Date  . CHOLECYSTECTOMY N/A 12/26/2016   Procedure: LAPAROSCOPIC CHOLECYSTECTOMY;  Surgeon: Franky Macho, MD;  Location: [facility];  Service: General;  Laterality: N/A;  . KNEE SURGERY Right 1997   arthroscopic  . polysomnogram  09/2016   Mild/mod OSA->CPAP (Dr. Lestine Mount)  . WISDOM TOOTH EXTRACTION  1997   Family History  Adopted: Yes  Problem Relation Age of Onset  . High blood pressure Mother   . Seizures Mother   . Colon cancer Neg Hx    Social History   Socioeconomic History  . Marital status: Divorced    Spouse name: Not on file  . Number of children: 0  . Years of education: 104  . Highest education level: Not on file  Occupational History  . Not on file  Tobacco Use  . Smoking status: Former Smoker    Packs/day: 1.00    Types: Cigarettes  . Smokeless tobacco: Former Neurosurgeon  . Tobacco comment: quit Nov 2018  Vaping Use  . Vaping Use: Never used  Substance and Sexual Activity  . Alcohol use: Yes    Alcohol/week: 1.0 standard drink    Types: 1 Cans of beer per week    Comment: heavy in past, short period of time in 20s.; 05/05/19 occasional about 1 beer a month  . Drug use: No    Comment: Quit 1996  . Sexual activity: Not on file  Other Topics Concern  . Not on file  Social History Narrative   Divorced, no children.   Educ: college at [employer]   Patient lives at home with his father and unemployed.  Disability for psych reasons.   Education some college.   Both handed.   Caffeine three cups daily soda and coffee.   Alc: occ beer (remote hx of alc abuse, marijuana, acid--no IV drugs) in 2005-2006, surround his problems coping  with GF's death.   Tobacco: former cig smoker, 20 pack-yr hx , quit 2018.   Social Determinants of Health   Financial Resource Strain: Low Risk   . Difficulty of Paying Living Expenses: Not hard at all  Food Insecurity: No Food Insecurity  . Worried About Programme researcher, broadcasting/film/video in the Last Year: Never true  . Ran Out of Food in the Last Year: Never true  Transportation Needs: No Transportation Needs  . Lack of Transportation (Medical): No  . Lack of Transportation (Non-Medical): No  Physical Activity: Inactive  . Days of Exercise per Week: 0 days  . Minutes of Exercise per Session: 0 min  Stress: No Stress Concern Present  . Feeling of Stress : Not at all  Social Connections: Moderately Integrated  .  Frequency of Communication with Friends and Family: More than three times a week  . Frequency of Social Gatherings with Friends and Family: More than three times a week  . Attends Religious Services: More than 4 times per year  . Active Member of Clubs or Organizations: No  . Attends Banker Meetings: Never  . Marital Status: Married    Tobacco Counseling Counseling given: Not Answered Comment: quit Nov 2018   Clinical Intake:  Pre-visit preparation completed: Yes  Pain : No/denies pain     Nutritional Status: BMI > 30  Obese Nutritional Risks: None Diabetes: No  How often do you need to have someone help you when you read instructions, pamphlets, or other written materials from your doctor or pharmacy?: 1 - Never What is the last grade level you completed in school?: college  Diabetic?No  Interpreter Needed?: No  Information entered by :: Thomasenia Sales LPN   Activities of Daily Living In your present state of health, do you have any difficulty performing the following activities: 01/27/2020  Hearing? N  Vision? N  Difficulty concentrating or making decisions? N  Walking or climbing stairs? N  Dressing or bathing? N  Doing errands, shopping? N   Preparing Food and eating ? N  Using the Toilet? N  In the past six months, have you accidently leaked urine? N  Do you have problems with loss of bowel control? N  Managing your Medications? N  Managing your Finances? N  Housekeeping or managing your Housekeeping? N  Some recent data might be hidden    Patient Care Team: Jeoffrey Massed, MD as PCP - General (Family Medicine) Jena Gauss, Gerrit Friends, MD as Consulting Physician (Gastroenterology) Thedore Mins, MD as Consulting Physician (Psychiatry) Beryle Beams, MD as Consulting Physician (Neurology)  Indicate any recent Medical Services you may have received from other than [facility] providers in the past year (date may be approximate).     Assessment:   This is a routine wellness examination for Rolly.  Hearing/Vision screen  Hearing Screening   125Hz  250Hz  500Hz  1000Hz  2000Hz  3000Hz  4000Hz  6000Hz  8000Hz   Right ear:           Left ear:           Comments: No issues  Vision Screening Comments: Wears contacts Last eye exam-2 yrs ago.  Dietary issues and exercise activities discussed: Current Exercise Habits: The patient does not participate in regular exercise at present, Exercise limited by: None identified  Goals    . Patient Stated     Lose weight by eating healthier      Depression Screen PHQ 2/9 Scores 01/27/2020 02/23/2019 01/07/2019  PHQ - 2 Score 0 0 0    Fall Risk Fall Risk  01/27/2020 02/23/2019  Falls in the past year? 0 0  Number falls in past yr: 0 0  Injury with Fall? 0 0  Follow up Falls prevention discussed -    FALL RISK PREVENTION PERTAINING TO THE HOME:  Any stairs in or around the home? Yes  If so, are there any without handrails? No  Home free of loose throw rugs in walkways, pet beds, electrical cords, etc? Yes  Adequate lighting in your home to reduce risk of falls? Yes   ASSISTIVE DEVICES UTILIZED TO PREVENT FALLS:  Life alert? No  Use of a cane, walker or w/c? No  Grab bars in the  bathroom? Yes  Shower chair or bench in shower? No  Elevated toilet seat or  a handicapped toilet? No   TIMED UP AND GO:  Was the test performed? No . Phone visit   Cognitive Function:Normal cognitive status assessed by  this Nurse Health Advisor. No abnormalities found.          Immunizations Immunization History  Administered Date(s) Administered  . Moderna Sars-Covid-2 Vaccination 05/28/2019, 06/25/2019  . Pneumococcal Polysaccharide-23 11/29/2016  . Tdap 01/29/2013    TDAP status: Up to date  Flu Vaccine status: Declined, Education has been provided regarding the importance of this vaccine but patient still declined. Advised may receive this vaccine at local pharmacy or Health Dept. Aware to provide a copy of the vaccination record if obtained from local pharmacy or Health Dept. Verbalized acceptance and understanding.  Pneumococcal vaccine status: Not yet indicated  Covid-19 vaccine status: Completed vaccines  Qualifies for Shingles Vaccine? No  Not yet indicated   Screening Tests Health Maintenance  Topic Date Due  . Hepatitis C Screening  Never done  . INFLUENZA VACCINE  Never done  . TETANUS/TDAP  01/30/2023  . COVID-19 Vaccine  Completed  . HIV Screening  Completed    Health Maintenance  Health Maintenance Due  Topic Date Due  . Hepatitis C Screening  Never done  . INFLUENZA VACCINE  Never done    Colorectal cancer screening: Not yet indicated  Lung Cancer Screening: (Low Dose CT Chest recommended if Age 69-80 years, 30 pack-year currently smoking OR have quit w/in 15years.) does not qualify.     Additional Screening:  Hepatitis C Screening: does not qualify  Vision Screening: Recommended annual ophthalmology exams for early detection of glaucoma and other disorders of the eye. Is the patient up to date with their annual eye exam?  No  Who is the provider or what is the name of the office in which the patient attends annual eye exams? Unsure of  name Patient to schedule his own appt   Dental Screening: Recommended annual dental exams for proper oral hygiene  Community Resource Referral / Chronic Care Management: CRR required this visit?  No   CCM required this visit?  No      Plan:     I have personally reviewed and noted the following in the patient's chart:   . Medical and social history . Use of alcohol, tobacco or illicit drugs  . Current medications and supplements . Functional ability and status . Nutritional status . Physical activity . Advanced directives . List of other physicians . Hospitalizations, surgeries, and ER visits in previous 12 months . Vitals . Screenings to include cognitive, depression, and falls . Referrals and appointments  In addition, I have reviewed and discussed with patient certain preventive protocols, quality metrics, and best practice recommendations. A written personalized care plan for preventive services as well as general preventive health recommendations were provided to patient.   Due to this being a telephonic visit, the after visit summary with patients personalized plan was offered to patient via mail or my-chart. Patient would like to access on my-chart.  Roanna Raider.  Elfrida Pixley A Lerlene Treadwell, LPN   [id number]/29/2021  Nurse Health Advisor  Nurse Notes: None

## 2020-02-02 ENCOUNTER — Encounter: Payer: Self-pay | Admitting: Pulmonary Disease

## 2020-02-02 ENCOUNTER — Other Ambulatory Visit: Payer: Self-pay | Admitting: Nurse Practitioner

## 2020-02-02 ENCOUNTER — Ambulatory Visit (INDEPENDENT_AMBULATORY_CARE_PROVIDER_SITE_OTHER): Payer: Medicare Other | Admitting: Pulmonary Disease

## 2020-02-02 ENCOUNTER — Other Ambulatory Visit: Payer: Self-pay

## 2020-02-02 VITALS — BP 144/86 | HR 93 | Temp 97.3°F | Ht 70.0 in | Wt 252.6 lb

## 2020-02-02 DIAGNOSIS — G4733 Obstructive sleep apnea (adult) (pediatric): Secondary | ICD-10-CM

## 2020-02-02 NOTE — Patient Instructions (Signed)
Will arrange for refitting of your CPAP mask ? ?Follow up in 1 year ?

## 2020-02-02 NOTE — Progress Notes (Signed)
Pulmonary, Critical Care, and Sleep Medicine  Chief Complaint  Patient presents with  . Consult    Obstructive sleep apnea    Constitutional:  BP (!) 144/86 (BP Location: Left Arm, Cuff Size: Normal)   Pulse 93   Temp (!) 97.3 F (36.3 C) (Other (Comment)) Comment (Src): wrist  Ht 5\' 10"  (1.778 m)   Wt 252 lb 9.6 oz (114.6 kg)   SpO2 96% Comment: Room air  BMI 36.24 kg/m   Past Medical History:  ADHD, Anxiety, Bipolar, Neck pain, GERD, Fatty liver, Headache, HTN, IBS, HLD, Vertigo, Tremor  Past Surgical History:  He  has a past surgical history that includes Wisdom tooth extraction (1997); Knee surgery (Right, 1997); Cholecystectomy (N/A, 12/26/2016); and polysomnogram (09/2016).  Brief Summary:  Bobby Jimenez is a 40 y.o. male former smoker with obstructive sleep apnea.       Subjective:   He was previously followed by Dr. 46.  Had sleep studies in 2018 and 2019.  Has been on CPAP since.  Gets supplies through 2020.  Pressure setting okay. Has trouble with mask leak.  Currently using nasal pillows.  Goes to bed at 10 pm.  Takes an hour to fall asleep.  Has trouble shutting his mind off.  Wakes up several times during the night.  Gets out of bed at 530 am.  Feels okay in the morning.  Not using anything to help stay awake.  Sometimes uses OCT sleep aide.  Physical Exam:   Appearance - well kempt   ENMT - no sinus tenderness, no oral exudate, no LAN, Mallampati 4 airway, no stridor  Respiratory - equal breath sounds bilaterally, no wheezing or rales  CV - s1s2 regular rate and rhythm, no murmurs  Ext - no clubbing, no edema  Skin - no rashes  Psych - normal mood and affect   Sleep Tests:   PSG 10/16/16 >> AHI 13.1, SpO2 low 88%  PSG 10/30/17 >> AHI 32.1, SpO2 low 88%; CPAP 8 cm H2O  CPAP 01/03/20 to 02/01/20 >> used on 23 of 30 nights with average 5 hrs 14 min.  Average AHI 5.1 with CPAP 12 cm H2O.  Air leak.  Social  History:  He  reports that he has quit smoking. His smoking use included cigarettes. He has a 10.00 pack-year smoking history. He has quit using smokeless tobacco. He reports current alcohol use of about 1.0 standard drink of alcohol per week. He reports that he does not use drugs.  Family History:  His family history includes High blood pressure in his mother; Seizures in his mother. He was adopted.     Assessment/Plan:   Obstructive sleep apnea. - he is compliant with CPAP and reports benefit from therapy - he uses 03/31/20 for his DME - continue CPAP 12 cm H2O - will arrange for mask refitting to see if this improves air leak  Obesity. - discussed how his weight can impact his health, particularly with regard to sleep apnea  Time Spent Involved in Patient Care on Day of Examination:  22 minutes  Follow up:  Patient Instructions  Will arrange for refitting of your CPAP mask  Follow up in 1 year   Medication List:   Allergies as of 02/02/2020      Reactions   Pineapple Shortness Of Breath, Swelling   Latex Swelling   Morphine And Related Hives, Itching   Statins    "locks urinary tract up"  Medication List       Accurate as of February 02, 2020 11:12 AM. If you have any questions, ask your nurse or doctor.        Abilify 10 MG tablet Generic drug: ARIPiprazole Take 5 mg by mouth 2 (two) times daily.   amantadine 100 MG capsule Commonly known as: SYMMETREL Take 100 mg by mouth 2 (two) times daily.   clonazePAM 0.5 MG tablet Commonly known as: KLONOPIN Take 0.25-0.5 mg by mouth 2 (two) times daily. Takes 1/2 tablet in am and 1 tablet at bedtime   dicyclomine 10 MG capsule Commonly known as: BENTYL TAKE 1 CAPSULE BY MOUTH 4 TIMES DAILY WITH MEALS AND AT BEDTIME AS NEEDED. HOLD FOR CONSTIPATION. What changed: See the new instructions.   fexofenadine 180 MG tablet Commonly known as: ALLEGRA Take 1 tablet (180 mg total) by mouth daily.    fluticasone 50 MCG/ACT nasal spray Commonly known as: FLONASE Place 2 sprays into both nostrils daily.   gemfibrozil 600 MG tablet Commonly known as: LOPID TAKE 1 TABLET BY MOUTH TWICE DAILY BEFORE A MEAL.   icosapent Ethyl 1 g capsule Commonly known as: VASCEPA Take 2 capsules (2 g total) by mouth 2 (two) times daily.   lisinopril 10 MG tablet Commonly known as: ZESTRIL TAKE 1 TABLET BY MOUTH ONCE A DAY.   metoprolol tartrate 25 MG tablet Commonly known as: LOPRESSOR TAKE (1) TABLET BY MOUTH TWICE DAILY.   multivitamin tablet Take 1 tablet by mouth daily.   pantoprazole 40 MG tablet Commonly known as: PROTONIX TAKE 1 TABLET BY MOUTH ONCE DAILY BEFORE BREAKFAST.   Zoloft 100 MG tablet Generic drug: sertraline Take 150 mg by mouth daily.       Signature:  Coralyn Helling, MD [facility] Pulmonary/Critical Care Pager - [phone] 02/02/2020, 11:12 AM

## 2020-02-10 ENCOUNTER — Encounter: Payer: Self-pay | Admitting: Internal Medicine

## 2020-02-10 ENCOUNTER — Telehealth: Payer: Self-pay | Admitting: Family Medicine

## 2020-02-10 ENCOUNTER — Telehealth (INDEPENDENT_AMBULATORY_CARE_PROVIDER_SITE_OTHER): Payer: Medicare Other | Admitting: Internal Medicine

## 2020-02-10 ENCOUNTER — Other Ambulatory Visit: Payer: Self-pay

## 2020-02-10 VITALS — Ht 70.0 in | Wt 252.0 lb

## 2020-02-10 DIAGNOSIS — E781 Pure hyperglyceridemia: Secondary | ICD-10-CM | POA: Diagnosis not present

## 2020-02-10 DIAGNOSIS — E7800 Pure hypercholesterolemia, unspecified: Secondary | ICD-10-CM

## 2020-02-10 DIAGNOSIS — E782 Mixed hyperlipidemia: Secondary | ICD-10-CM

## 2020-02-10 DIAGNOSIS — E669 Obesity, unspecified: Secondary | ICD-10-CM

## 2020-02-10 NOTE — Telephone Encounter (Signed)
Referral placed.

## 2020-02-10 NOTE — Telephone Encounter (Signed)
Received call from Melissa at [facility]. She states his referral for them has expired. Please enter new referral.

## 2020-02-10 NOTE — Progress Notes (Signed)
Virtual Visit via Telephone Note   This visit type was conducted due to national recommendations for restrictions regarding the COVID-19 Pandemic (e.g. social distancing) in an effort to limit this patient's exposure and mitigate transmission in our community.  Due to his co-morbid illnesses, this patient is at least at moderate risk for complications without adequate follow up.  This format is felt to be most appropriate for this patient at this time.  The patient did not have access to video technology/had technical difficulties with video requiring transitioning to audio format only (telephone).  All issues noted in this document were discussed and addressed.  No physical exam could be performed with this format.  Please refer to the patient's chart for his  consent to telehealth for [facility].   Date:  02/10/2020   ID:  Bobby Jimenez, DOB 1980/04/18, MRN [id number] The patient was identified using 2 identifiers.  Evaluation Performed:  Follow-Up Visit  Patient Location:  [address] [place] [address]  Provider location:   [address], [address] [place], [place] [address]  PCP:  Jeoffrey Massed, MD  Cardiologist:  No primary care provider on file. Electrophysiologist:  None   Chief Complaint:  Follow-up dyslipidemia  History of Present Illness:    Bobby Jimenez is a 40 y.o. male who presents via audio/video conferencing for a telehealth visit today. This is a pleasant 40 year old male is kindly referred for evaluation management of hypertriglyceridemia.  Bobby Jimenez has a longstanding history of elevated triglycerides.  He says his uncle by birth had high triglycerides and is very thin.  He did not know much about his father since he is adopted but his birth mother may have also had high triglycerides.  She is now deceased.  Over a year ago triglycerides were noted at 745, then improved somewhat to 581 after work with a dietitian and more recently 3 months  ago were elevated 837.  Direct LDL was 53 and hemoglobin E9B was 5.6.  He has no coronary disease that is notable.  He denies any history of pancreatitis.  He is on medications that could alter his triglyceride metabolism, including Abilify which is known to raise triglycerides, as well as this sertraline and to a small extent metoprolol.  He works with a Therapist, sports, Dr. Jannifer Franklin.  He reports very rare alcohol use, no more than 1-2 drinks per week.  02/10/2020  Bobby Jimenez is seen today via telephone follow-up.  Overall he seems to be doing well.  He says he is improved his diet and is more physically active.  He is now working a job that requires more physical activity.  Mind has had continued improvement in his lipids.  Total cholesterol now 168, triglycerides 348 (down from over 800, approximately 6 months ago), HDL 29 and LDL of 82.  APO B level has come down from 122-107.  He is not currently on statin therapy.  The patient does not have symptoms concerning for COVID-19 infection (fever, chills, cough, or new SHORTNESS OF BREATH).    Prior CV studies:   The following studies were reviewed today:  Lab work  PMHx:  Past Medical History:  Diagnosis Date  . ADHD (attention deficit hyperactivity disorder)   . Anxiety   . Bipolar disorder (HCC)   . Cervical radiculopathy   . Dizziness    lightheaded  . Food allergy    pineapple  . GERD (gastroesophageal reflux disease)   . Headache syndrome   . Hepatic  steatosis    u/s and CT confirmed 2020--no cirrhosis.  . Hypertension   . Hypertriglyceridemia    Very high (>800 while on vascepa and rosuva 06/2019)->rosuva and vascepa d/c'd and gemfibrozil started, also referred pt to advanced lipid clinic  . IBS (irritable bowel syndrome)    D.  bentyl helping ([facility])  . Latex allergy   . Low back pain   . Mixed hyperlipidemia   . OSA on CPAP 10/16/2016  . Tremors of nervous system    arms, dx'd as lasting side effect from  lithium use in remote past: psych has him on amantadine for this.  . Vertigo     Past Surgical History:  Procedure Laterality Date  . CHOLECYSTECTOMY N/A 12/26/2016   Procedure: LAPAROSCOPIC CHOLECYSTECTOMY;  Surgeon: Franky Macho, MD;  Location: [facility];  Service: General;  Laterality: N/A;  . KNEE SURGERY Right 1997   arthroscopic  . polysomnogram  09/2016   Mild/mod OSA->CPAP (Dr. Lestine Mount)  . WISDOM TOOTH EXTRACTION  1997    FAMHx:  Family History  Adopted: Yes  Problem Relation Age of Onset  . High blood pressure Mother   . Seizures Mother   . Colon cancer Neg Hx     SOCHx:   reports that he has quit smoking. His smoking use included cigarettes. He has a 10.00 pack-year smoking history. He has quit using smokeless tobacco. He reports current alcohol use of about 1.0 standard drink of alcohol per week. He reports that he does not use drugs.  ALLERGIES:  Allergies  Allergen Reactions  . Pineapple Shortness Of Breath and Swelling  . Latex Swelling  . Morphine And Related Hives and Itching  . Statins     "locks urinary tract up"    MEDS:  Current Meds  Medication Sig  . ABILIFY 10 MG tablet Take 5 mg by mouth 2 (two) times daily.   Marland Kitchen amantadine (SYMMETREL) 100 MG capsule Take 100 mg by mouth 2 (two) times daily.   . clonazePAM (KLONOPIN) 0.5 MG tablet Take 0.25-0.5 mg by mouth 2 (two) times daily. Takes 1/2 tablet in am and 1 tablet at bedtime  . dicyclomine (BENTYL) 10 MG capsule TAKE 1 CAPSULE BY MOUTH UP TO 3 TIMES DAILY WITH MEALS AND AT BEDTIME AS NEEDED. HOLD FOR CONSTIPATION.  . fexofenadine (ALLEGRA) 180 MG tablet Take 1 tablet (180 mg total) by mouth daily.  . fluticasone (FLONASE) 50 MCG/ACT nasal spray Place 2 sprays into both nostrils daily.  Marland Kitchen gemfibrozil (LOPID) 600 MG tablet TAKE 1 TABLET BY MOUTH TWICE DAILY BEFORE A MEAL.  Marland Kitchen icosapent Ethyl (VASCEPA) 1 g capsule Take 2 capsules (2 g total) by mouth 2 (two) times daily.  Marland Kitchen lisinopril (ZESTRIL) 10 MG  tablet TAKE 1 TABLET BY MOUTH ONCE A DAY.  . metoprolol tartrate (LOPRESSOR) 25 MG tablet TAKE (1) TABLET BY MOUTH TWICE DAILY.  . Multiple Vitamin (MULTIVITAMIN) tablet Take 1 tablet by mouth daily.  . pantoprazole (PROTONIX) 40 MG tablet TAKE 1 TABLET BY MOUTH ONCE DAILY BEFORE BREAKFAST.  Marland Kitchen ZOLOFT 100 MG tablet Take 150 mg by mouth daily.      ROS: Pertinent items noted in HPI and remainder of comprehensive ROS otherwise negative.  Labs/Other Tests and Data Reviewed:    Recent Labs: 11/05/2019: ALT 33; BUN 18; Creat 1.00; Hemoglobin 13.9; Platelets 249; Potassium 5.1; Sodium 138   Recent Lipid Panel Lab Results  Component Value Date/Time   CHOL 168 01/18/2020 10:10 AM   TRIG  348 (H) 01/18/2020 10:10 AM   HDL 29 (L) 01/18/2020 10:10 AM   CHOLHDL 5.8 (H) 01/18/2020 10:10 AM   CHOLHDL 5 07/16/2019 08:58 AM   LDLCALC 82 01/18/2020 10:10 AM   LDLDIRECT 101 (H) 10/30/2019 09:56 AM   LDLDIRECT 53.0 07/16/2019 08:58 AM    Wt Readings from Last 3 Encounters:  02/10/20 252 lb (114.3 kg)  02/02/20 252 lb 9.6 oz (114.6 kg)  01/27/20 260 lb (117.9 kg)     Exam:    Vital Signs:  Ht 5\' 10"  (1.778 m)   Wt 252 lb (114.3 kg)   BMI 36.16 kg/m    Exam not performed due to telephone visit  ASSESSMENT & PLAN:    1. Primary hypertriglyceridemia  Mr. Tuminello has had significant improvement in his triglycerides and a combination of Vascepa and gemfibrozil with more recent changes in his diet and physical activity that will likely responsible for his improved numbers.  He is able B level remains elevated and we may consider adding low-dose statin therapy if his numbers are not more improved at follow-up with repeat lipids and APO B in about 6 months.  For now continue current treatments.  COVID-19 Education: The signs and symptoms of COVID-19 were discussed with the patient and how to seek care for testing (follow up with PCP or arrange E-visit).  The importance of social distancing was  discussed today.  Patient Risk:   After full review of this patients clinical status, I feel that they are at least moderate risk at this time.  Time:   Today, I have spent 15 minutes with the patient with telehealth technology discussing dyslipidemia.     Medication Adjustments/Labs and Tests Ordered: Current medicines are reviewed at length with the patient today.  Concerns regarding medicines are outlined above.   Tests Ordered: No orders of the defined types were placed in this encounter.   Medication Changes: No orders of the defined types were placed in this encounter.   Disposition:  in 6 month(s)  Sedonia Small, MD, [facility], FACP  [facility]  Medical Director of the Advanced Lipid Disorders &  Cardiovascular Risk Reduction Clinic Diplomate of the American Board of Clinical Lipidology Attending Cardiologist  Direct Dial: [phone]  Fax: [phone]  Website:  [email or web address]  [phone], MD  02/10/2020 12:25 PM

## 2020-02-10 NOTE — Patient Instructions (Addendum)
Medication Instructions:  No Changes In Medications at this time.  *If you need a refill on your cardiac medications before your next appointment, please call your pharmacy*  Lab Work: REPEAT LIPID PANEL & ApoB- THIS NEEDS TO BE DONE AFTER FASTING BEFORE 6 MONTH APPOINTMENT  If you have labs (blood work) drawn today and your tests are completely normal, you will receive your results only by: Marland Kitchen MyChart Message (if you have MyChart) OR . A paper copy in the mail If you have any lab test that is abnormal or we need to change your treatment, we will call you to review the results.  Follow-Up: At [facility], you and your health needs are our priority.  As part of our continuing mission to provide you with exceptional heart care, we have created designated Provider Care Teams.  These Care Teams include your primary Cardiologist (physician) and Advanced Practice Providers (APPs -  Physician Assistants and Nurse Practitioners) who all work together to provide you with the care you need, when you need it.   Your next appointment:   6 MONTHS   The format for your next appointment:   In Person  Provider:   K. Italy Hilty, MD

## 2020-02-10 NOTE — Telephone Encounter (Signed)
Yes pls order new referral-thx

## 2020-02-10 NOTE — Telephone Encounter (Signed)
Please advise if it is ok to place a new referral.

## 2020-02-12 ENCOUNTER — Other Ambulatory Visit: Payer: Self-pay

## 2020-02-12 ENCOUNTER — Ambulatory Visit (INDEPENDENT_AMBULATORY_CARE_PROVIDER_SITE_OTHER): Payer: Medicare Other | Admitting: Family Medicine

## 2020-02-12 ENCOUNTER — Encounter: Payer: Self-pay | Admitting: Family Medicine

## 2020-02-12 VITALS — BP 115/68 | HR 90 | Temp 97.9°F | Resp 16 | Ht 70.0 in | Wt 258.6 lb

## 2020-02-12 DIAGNOSIS — R300 Dysuria: Secondary | ICD-10-CM | POA: Diagnosis not present

## 2020-02-12 DIAGNOSIS — N453 Epididymo-orchitis: Secondary | ICD-10-CM | POA: Diagnosis not present

## 2020-02-12 LAB — POCT URINALYSIS DIPSTICK
Bilirubin, UA: NEGATIVE
Blood, UA: NEGATIVE
Glucose, UA: NEGATIVE
Ketones, UA: NEGATIVE
Leukocytes, UA: NEGATIVE
Nitrite, UA: NEGATIVE
Protein, UA: POSITIVE — AB
Spec Grav, UA: >=1.03 — AB (ref 1.010–1.025)
Urobilinogen, UA: 0.2 E.U./dL
pH, UA: 5.5 (ref 5.0–8.0)

## 2020-02-12 MED ORDER — SULFAMETHOXAZOLE-TRIMETHOPRIM 800-160 MG PO TABS: 1.0000 | ORAL_TABLET | Freq: Two times a day (BID) | ORAL | 0 refills | Status: AC

## 2020-02-12 NOTE — Progress Notes (Signed)
OFFICE VISIT  02/12/2020  CC:  Chief Complaint  Patient presents with  . UTI symptoms    Burning when urinating, testicular pain and mid abdominal pain x 4 days    HPI:    Patient is a 40 y.o. male who presents for "UTI symptoms". I last saw him about 7 months ago. A/P as of that visit: "1) HTN: The current medical regimen is effective;  continue present plan and medications. Lytes/cr ordered--future.  2) HLD: tolerating increased dose of rosuva+ continues to take 2g vascepa bid FLP and hepatic panel ordered--future when fasting.  3) obesity and hepatic steatosis: still needs to ramp up the healthy diet and vigorous cv exercise and focus on wt loss. Hepatic panel today.  4) High risk med use: Abilify 10mg  qd long term. Along with upcoming glucose and lipid monitoring, I'll order HbA1c. His psychiatrist manages his psych diagnoses/meds."  HPI: Onset 2 d/a with burning in urethra when urinating, increased frequency and signif urgency, urine darker. No odor.  No penile d/c, no recent unprotected intercourse or new partner, no hx of STD. Hurts from both testicles up into bladder region/suprapubic area.  Says he drinks lots of fluids very day.  No n/v/d. No fevers.  Past Medical History:  Diagnosis Date  . ADHD (attention deficit hyperactivity disorder)   . Anxiety   . Bipolar disorder (HCC)   . Cervical radiculopathy   . Dizziness    lightheaded  . Food allergy    pineapple  . GERD (gastroesophageal reflux disease)   . Headache syndrome   . Hepatic steatosis    u/s and CT confirmed 2020--no cirrhosis.  . Hypertension   . Hypertriglyceridemia    Very high (>800 while on vascepa and rosuva 06/2019)->rosuva and vascepa d/c'd and gemfibrozil started, also referred pt to advanced lipid clinic  . IBS (irritable bowel syndrome)    D.  bentyl helping ([facility])  . Latex allergy   . Low back pain   . Mixed hyperlipidemia   . OSA on CPAP 10/16/2016  .  Tremors of nervous system    arms, dx'd as lasting side effect from lithium use in remote past: psych has him on amantadine for this.  . Vertigo     Past Surgical History:  Procedure Laterality Date  . CHOLECYSTECTOMY N/A 12/26/2016   Procedure: LAPAROSCOPIC CHOLECYSTECTOMY;  Surgeon: 12/28/2016, MD;  Location: [facility];  Service: General;  Laterality: N/A;  . KNEE SURGERY Right 1997   arthroscopic  . polysomnogram  09/2016   Mild/mod OSA->CPAP (Dr. 10/2016)  . WISDOM TOOTH EXTRACTION  1997    Outpatient Medications Prior to Visit  Medication Sig Dispense Refill  . ABILIFY 10 MG tablet Take 5 mg by mouth 2 (two) times daily.     1998 amantadine (SYMMETREL) 100 MG capsule Take 100 mg by mouth 2 (two) times daily.     . clonazePAM (KLONOPIN) 0.5 MG tablet Take 0.25-0.5 mg by mouth 2 (two) times daily. Takes 1/2 tablet in am and 1 tablet at bedtime    . dicyclomine (BENTYL) 10 MG capsule TAKE 1 CAPSULE BY MOUTH UP TO 3 TIMES DAILY WITH MEALS AND AT BEDTIME AS NEEDED. HOLD FOR CONSTIPATION. 120 capsule 1  . fexofenadine (ALLEGRA) 180 MG tablet Take 1 tablet (180 mg total) by mouth daily. 90 tablet 1  . fluticasone (FLONASE) 50 MCG/ACT nasal spray Place 2 sprays into both nostrils daily. 16 g 6  . gemfibrozil (LOPID) 600 MG tablet TAKE 1  TABLET BY MOUTH TWICE DAILY BEFORE A MEAL. 60 tablet 0  . icosapent Ethyl (VASCEPA) 1 g capsule Take 2 capsules (2 g total) by mouth 2 (two) times daily. 360 capsule 3  . lisinopril (ZESTRIL) 10 MG tablet TAKE 1 TABLET BY MOUTH ONCE A DAY. 90 tablet 3  . metoprolol tartrate (LOPRESSOR) 25 MG tablet TAKE (1) TABLET BY MOUTH TWICE DAILY. 180 tablet 3  . Multiple Vitamin (MULTIVITAMIN) tablet Take 1 tablet by mouth daily.    . pantoprazole (PROTONIX) 40 MG tablet TAKE 1 TABLET BY MOUTH ONCE DAILY BEFORE BREAKFAST. 90 tablet 3  . ZOLOFT 100 MG tablet Take 150 mg by mouth daily.      No facility-administered medications prior to visit.    Allergies  Allergen  Reactions  . Pineapple Shortness Of Breath and Swelling  . Latex Swelling  . Morphine And Related Hives and Itching  . Statins     "locks urinary tract up"    ROS As per HPI  PE: Vitals with BMI 02/12/2020 02/10/2020 02/02/2020  Height 5\' 10"  5\' 10"  5\' 10"   Weight 258 lbs 10 oz 252 lbs 252 lbs 10 oz  BMI 37.11 36.16 36.24  Systolic 115 - 144  Diastolic 68 - 86  Pulse 90 - 93    Gen: Alert, well appearing.  Patient is oriented to person, place, time, and situation. AFFECT: pleasant, lucid thought and speech. GU: mild suprapubic TTP diffusely, also TTP bilat testicles/scrotum---mild, seems more tender over epididymus bilat.  No scrotal or testicular swelling/enlargement or erythema.  No rash.  No penile d/c and no skin/genital lesions.  LABS:    Chemistry      Component Value Date/Time   NA 138 11/05/2019 1417   K 5.1 11/05/2019 1417   CL 102 11/05/2019 1417   CO2 26 11/05/2019 1417   BUN 18 11/05/2019 1417   CREATININE 1.00 11/05/2019 1417      Component Value Date/Time   CALCIUM 10.4 (H) 11/05/2019 1417   ALKPHOS 65 07/16/2019 0858   AST 30 11/05/2019 1417   ALT 33 11/05/2019 1417   BILITOT 0.4 11/05/2019 1417     Lab Results  Component Value Date   HGBA1C 5.6 07/16/2019   Lab Results  Component Value Date   WBC 5.8 11/05/2019   HGB 13.9 11/05/2019   HCT 39.2 11/05/2019   MCV 89.3 11/05/2019   PLT 249 11/05/2019   Lab Results  Component Value Date   TSH 2.58 01/09/2019   POC CC dipstick UA today:  SG high, + protein, o/w normal.  IMPRESSION AND PLAN:  1) Acute epididymo-orchitis. Recurrent (says he had this multiple times when in prison). Denies STD hx.  His "clean" ua today showed no sign of infection. Will treat with bactrim DS 1 bid x 14d. If not improving signif in the next week then we'll have him return to give "dirty urine" specimen to do GC/Chlamydia testing on.  An After Visit Summary was printed and given to the patient.  FOLLOW UP: Return  if symptoms worsen or fail to improve.  Signed:  01/05/2020, MD           02/12/2020

## 2020-02-17 ENCOUNTER — Encounter: Payer: Self-pay | Admitting: Nutrition

## 2020-02-17 ENCOUNTER — Encounter: Payer: Medicare Other | Attending: Family Medicine | Admitting: Nutrition

## 2020-02-17 ENCOUNTER — Other Ambulatory Visit: Payer: Self-pay

## 2020-02-17 ENCOUNTER — Ambulatory Visit: Payer: Medicare Other | Admitting: Nutrition

## 2020-02-17 DIAGNOSIS — E7800 Pure hypercholesterolemia, unspecified: Secondary | ICD-10-CM | POA: Insufficient documentation

## 2020-02-17 DIAGNOSIS — K219 Gastro-esophageal reflux disease without esophagitis: Secondary | ICD-10-CM | POA: Insufficient documentation

## 2020-02-17 DIAGNOSIS — Z713 Dietary counseling and surveillance: Secondary | ICD-10-CM | POA: Insufficient documentation

## 2020-02-17 DIAGNOSIS — E781 Pure hyperglyceridemia: Secondary | ICD-10-CM | POA: Insufficient documentation

## 2020-02-17 DIAGNOSIS — E782 Mixed hyperlipidemia: Secondary | ICD-10-CM | POA: Insufficient documentation

## 2020-02-17 DIAGNOSIS — G4730 Sleep apnea, unspecified: Secondary | ICD-10-CM | POA: Insufficient documentation

## 2020-02-17 DIAGNOSIS — I10 Essential (primary) hypertension: Secondary | ICD-10-CM | POA: Insufficient documentation

## 2020-02-17 DIAGNOSIS — E669 Obesity, unspecified: Secondary | ICD-10-CM | POA: Insufficient documentation

## 2020-02-17 NOTE — Progress Notes (Signed)
.   Medical Nutrition Therapy:  Appt start time: 1415 end time:  1445  Assessment:  Primary concerns today: Obesity, Hypertriglyceridemia, and Hyperlipidemia.Marland Kitchen PMH: HTN, Hyperlipidemia, Sleep apnea, Anxiety and GERD.  Obesity, Hyperlipidemia Follow up Referral diagnosis: e66.9, E78   Preferred learning style:  no preference indicate Readiness to learn  change in progress  He notes he has lost 10 lbs in the last 6 months. Physical activity: walks some. Use to work out and willing to get back into lifting weights and join PF.  Lost 15 lbs since last visit. He reports his cholesterol levels have come down. Has cut out a lot of processed foods.He has been eating more vegetables, more fresh fruits. Feels a lot better. His IBS and GERD is a lot better.  His Cardiologist told him some of his depression meds may effect his TG so they're going to keep an eye on them for now. His Cardiologist has him on Vascepa for TG and Genfibrozil.   CMP Latest Ref Rng & Units 11/05/2019 07/16/2019 01/09/2019  Glucose 65 - 139 mg/dL 497 026(V) 96  BUN 7 - 25 mg/dL 18 14 17   Creatinine 0.60 - 1.35 mg/dL 7.85 8.85  Sodium 135 - 146 mmol/L 138 138 139  Potassium 3.5 - 5.3 mmol/L 5.1 4.2 4.0  Chloride 98 - 110 mmol/L 102 101 106  CO2 20 - 32 mmol/L 26 29 24   Calcium 8.6 - 10.3 mg/dL 10.4(H) 9.3 9.4  Total Protein 6.1 - 8.1 g/dL 7.4 7.1 7.1  Total Bilirubin 0.2 - 1.2 mg/dL 0.4 0.4 0.4  Alkaline Phos 39 - 117 U/L - 65 77  AST 10 - 40 U/L 30 17 21   ALT 9 - 46 U/L 33 23 37   Lipid Panel     Component Value Date/Time   CHOL 168 01/18/2020 1010   TRIG 348 (H) 01/18/2020 1010   HDL 29 (L) 01/18/2020 1010   CHOLHDL 5.8 (H) 01/18/2020 1010   CHOLHDL 5 07/16/2019 0858   LDLCALC 82 01/18/2020 1010   LDLDIRECT 101 (H) 10/30/2019 0956   LDLDIRECT 53.0 07/16/2019 0858   LABVLDL 57 (H) 01/18/2020 1010     Preferred Learning Style:  No preference indicated   Learning Readiness:    Change in  progress   MEDICATIONS:    DIETARY INTAKE:     24-hr recall:  B ( AM): Raisin bran and egg or frosted mini wheat, fruit,  Snk ( AM):   L ( PM): Grilled chicken and salad, milk, water Snk ( PM):  D ( PM): steak, spring mix and water  Snk ( PM):  Beverages:  water.  Usual physical activity:  ADL,   Estimated energy needs: 1800 calories 200 g carbohydrates 135 g protein 50 g fat  Progress Towards Goal(s):  In progress.   Nutritional Diagnosis:  NB-1.1 Food and nutrition-related knowledge deficit As related to Obesity, hyperlipidemia.  As evidenced by BMI > 30 and TG > 500.    Intervention Weight loss education and Hyperlipidemia education provided on My Plate, CHO counting, meal planning, portion sizes, timing of meals, avoiding snacks between meals, ttaking medications as prescribed, benefits of exercising 60 minutes per day and prevention of DM. Cut out sweet tea and processed foods.Goals  Goals  Work out 3 day a week for hour or walk Continue to increase fiber rich foods. Lose 1-2 lbs per week. Get a scale. Eat more plant based foods and avoid processed foods.   . Teaching Method Utilized:  Visual Auditory  Handouts given during visit include:  none  Barriers to learning/adherence to lifestyle change: none  Demonstrated degree of understanding via:  Teach Back   Monitoring/Evaluation:  Dietary intake, exercise,and body weight 4 month. Marland Kitchen

## 2020-02-17 NOTE — Patient Instructions (Addendum)
Goals  Work out 3 day a week for hour or walk Continue to increase fiber rich foods. Lose 1-2 lbs per week. Get a scale. Eat more plant based foods and avoid processed foods.

## 2020-02-22 ENCOUNTER — Other Ambulatory Visit: Payer: Self-pay | Admitting: Family Medicine

## 2020-02-25 ENCOUNTER — Other Ambulatory Visit: Payer: Self-pay | Admitting: Internal Medicine

## 2020-02-25 ENCOUNTER — Telehealth: Payer: Self-pay | Admitting: Family Medicine

## 2020-02-25 DIAGNOSIS — F339 Major depressive disorder, recurrent, unspecified: Secondary | ICD-10-CM | POA: Diagnosis not present

## 2020-02-25 DIAGNOSIS — F419 Anxiety disorder, unspecified: Secondary | ICD-10-CM | POA: Diagnosis not present

## 2020-02-25 NOTE — Telephone Encounter (Signed)
Please advise if pt should isolate for 5 days and continue to wear a mask for 10 days. If develop sx treat with OTC meds.

## 2020-02-25 NOTE — Telephone Encounter (Signed)
Patient states he tested positive for Covid at [facility] in [place] on 02/19/20 but he has no symptoms. Patient has would like to speak with someone regarding quarantine, going to work or if he should be treated with anything. Please call patient to advise.

## 2020-03-07 DIAGNOSIS — F902 Attention-deficit hyperactivity disorder, combined type: Secondary | ICD-10-CM | POA: Diagnosis not present

## 2020-03-07 DIAGNOSIS — F3132 Bipolar disorder, current episode depressed, moderate: Secondary | ICD-10-CM | POA: Diagnosis not present

## 2020-03-07 DIAGNOSIS — F4011 Social phobia, generalized: Secondary | ICD-10-CM | POA: Diagnosis not present

## 2020-03-22 DIAGNOSIS — F419 Anxiety disorder, unspecified: Secondary | ICD-10-CM | POA: Diagnosis not present

## 2020-03-25 ENCOUNTER — Ambulatory Visit (INDEPENDENT_AMBULATORY_CARE_PROVIDER_SITE_OTHER): Payer: Medicare Other | Admitting: Family Medicine

## 2020-03-25 ENCOUNTER — Other Ambulatory Visit: Payer: Self-pay

## 2020-03-25 ENCOUNTER — Encounter: Payer: Self-pay | Admitting: Family Medicine

## 2020-03-25 VITALS — BP 121/76 | HR 86 | Temp 97.9°F | Resp 16 | Ht 70.0 in | Wt 261.8 lb

## 2020-03-25 DIAGNOSIS — G5603 Carpal tunnel syndrome, bilateral upper limbs: Secondary | ICD-10-CM

## 2020-03-25 NOTE — Patient Instructions (Signed)
Apply ice packs to your wrists for 15 minutes twice per day. Wear your wrist splints at all times when not using your hands at [facility] wear them at night when sleeping.

## 2020-03-25 NOTE — Progress Notes (Signed)
OFFICE VISIT  03/25/2020  CC:  Chief Complaint  Patient presents with  . Numbness in hands    X 1 month, tingling and unable to grip anything. Denies any burning pain or sensation. Has tried using tylenol for relief but is not helping   HPI:    Patient is a 40 y.o. male who presents for numbness in hands. Onset about 1 mo ago, both hands feel numb and tingly all over from wrists into fingers, pain in volar aspect of wrists.  No burning sensation.   No specific fingers---says ALL.  Tried wrist splints for a couple days and they felt better so he stopped them and it returned.   Tried some tyl/motrin occ/prn but no help. No icing has been tried. 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  No neck pain, no pain down arms, no tingling down arms.   Past Medical History:  Diagnosis Date  . ADHD (attention deficit hyperactivity disorder)   . Anxiety   . Bipolar disorder (HCC)   . Cervical radiculopathy   . Dizziness    lightheaded  . Food allergy    pineapple  . GERD (gastroesophageal reflux disease)   . Headache syndrome   . Hepatic steatosis    u/s and CT confirmed 2020--no cirrhosis.  . Hypertension   . Hypertriglyceridemia    Very high (>800 while on vascepa and rosuva 06/2019)->rosuva and vascepa d/c'd and gemfibrozil started, also referred pt to advanced lipid clinic  . IBS (irritable bowel syndrome)    D.  bentyl helping ([facility])  . Latex allergy   . Low back pain   . Mixed hyperlipidemia   . OSA on CPAP 10/16/2016  . Tremors of nervous system    arms, dx'd as lasting side effect from lithium use in remote past: psych has him on amantadine for this.  . Vertigo     Past Surgical History:  Procedure Laterality Date  . CHOLECYSTECTOMY N/A 12/26/2016   Procedure: LAPAROSCOPIC CHOLECYSTECTOMY;  Surgeon: Franky Macho, MD;  Location: [facility];  Service: General;  Laterality: N/A;  . KNEE SURGERY Right 1997    arthroscopic  . polysomnogram  09/2016   Mild/mod OSA->CPAP (Dr. Lestine Mount)  . WISDOM TOOTH EXTRACTION  1997    Outpatient Medications Prior to Visit  Medication Sig Dispense Refill  . ABILIFY 10 MG tablet Take 5 mg by mouth 2 (two) times daily.     Marland Kitchen amantadine (SYMMETREL) 100 MG capsule Take 100 mg by mouth 2 (two) times daily.     . clonazePAM (KLONOPIN) 0.5 MG tablet Take 0.25-0.5 mg by mouth 2 (two) times daily. Takes 1/2 tablet in am and 1 tablet at bedtime    . dicyclomine (BENTYL) 10 MG capsule TAKE 1 CAPSULE BY MOUTH UP TO 3 TIMES DAILY WITH MEALS AND AT BEDTIME AS NEEDED. HOLD FOR CONSTIPATION. 120 capsule 1  . fexofenadine (ALLEGRA) 180 MG tablet Take 1 tablet (180 mg total) by mouth daily. 90 tablet 1  . fluticasone (FLONASE) 50 MCG/ACT nasal spray Place 2 sprays into both nostrils daily. 16 g 6  . gemfibrozil (LOPID) 600 MG tablet TAKE 1 TABLET BY MOUTH TWICE DAILY BEFORE A MEAL. 60 tablet 0  . icosapent Ethyl (VASCEPA) 1 g capsule Take 2 capsules (2 g total) by mouth 2 (two) times daily. 360 capsule 3  . lisinopril (ZESTRIL) 10 MG tablet TAKE 1 TABLET BY MOUTH ONCE A DAY. 90 tablet 3  .  metoprolol tartrate (LOPRESSOR) 25 MG tablet TAKE (1) TABLET BY MOUTH TWICE DAILY. 180 tablet 0  . Multiple Vitamin (MULTIVITAMIN) tablet Take 1 tablet by mouth daily.    . pantoprazole (PROTONIX) 40 MG tablet TAKE 1 TABLET BY MOUTH ONCE DAILY BEFORE BREAKFAST. 90 tablet 3  . ZOLOFT 100 MG tablet Take 150 mg by mouth daily.      No facility-administered medications prior to visit.    Allergies  Allergen Reactions  . Pineapple Shortness Of Breath and Swelling  . Latex Swelling  . Morphine And Related Hives and Itching  . Statins     "locks urinary tract up"    ROS As per HPI  PE: Vitals with BMI 03/25/2020 02/12/2020 02/10/2020  Height 5\' 10"  5\' 10"  5\' 10"   Weight 261 lbs 13 oz 258 lbs 10 oz 252 lbs  BMI 37.56 37.11 36.16  Systolic 121 115 -  Diastolic 76 68 -  Pulse 86 90 -      Gen: Alert, well appearing.  Patient is oriented to person, place, time, and situation. AFFECT: pleasant, lucid thought and speech. Tinel's and phalen's POS bilat. Dec sensation in hands, esp in first 3 digits each hand. Grip strength 4/5 bilat.  Wrist and finger ROM intact, w/out any joint swelling or erythema.      LABS:    Chemistry      Component Value Date/Time   NA 138 11/05/2019 1417   K 5.1 11/05/2019 1417   CL 102 11/05/2019 1417   CO2 26 11/05/2019 1417   BUN 18 11/05/2019 1417   CREATININE 1.00 11/05/2019 1417      Component Value Date/Time   CALCIUM 10.4 (H) 11/05/2019 1417   ALKPHOS 65 07/16/2019 0858   AST 30 11/05/2019 1417   ALT 33 11/05/2019 1417   BILITOT 0.4 11/05/2019 1417     Lab Results  Component Value Date   CHOL 168 01/18/2020   HDL 29 (L) 01/18/2020   LDLCALC 82 01/18/2020   LDLDIRECT 101 (H) 10/30/2019   TRIG 348 (H) 01/18/2020   CHOLHDL 5.8 (H) 01/18/2020   Lab Results  Component Value Date   HGBA1C 5.6 07/16/2019   Lab Results  Component Value Date   WBC 5.8 11/05/2019   HGB 13.9 11/05/2019   HCT 39.2 11/05/2019   MCV 89.3 11/05/2019   PLT 249 11/05/2019   Lab Results  Component Value Date   TSH 2.58 01/09/2019    IMPRESSION AND PLAN:  Bilat CTS. Needs to get back into wearing splints and do it more consistently/longer. Fitted with splints in office today. Instructions: Apply ice packs to your wrists for 15 minutes twice per day. Wear your wrist splints at all times when not using your hands at [facility] wear them at night when sleeping. If he's not improving in 1 wk then will refer to specialist for consideration of steroid inj into CT bilat. Discussed with pt that it may take 4-6 wks of wearing splints regularly to get signif lasting imp.  An After Visit Summary was printed and given to the patient.  FOLLOW UP: Return in about 4 weeks (around 04/22/2020) for f/u CTS.  Signed:  14/11/2018, MD            03/25/2020

## 2020-03-30 ENCOUNTER — Other Ambulatory Visit: Payer: Self-pay | Admitting: Internal Medicine

## 2020-04-06 ENCOUNTER — Other Ambulatory Visit: Payer: Self-pay | Admitting: Family Medicine

## 2020-04-21 ENCOUNTER — Other Ambulatory Visit: Payer: Self-pay

## 2020-04-22 ENCOUNTER — Ambulatory Visit (INDEPENDENT_AMBULATORY_CARE_PROVIDER_SITE_OTHER): Payer: Medicare Other | Admitting: Family Medicine

## 2020-04-22 ENCOUNTER — Encounter: Payer: Self-pay | Admitting: Family Medicine

## 2020-04-22 VITALS — BP 124/80 | HR 77 | Temp 98.2°F | Ht 70.0 in | Wt 255.4 lb

## 2020-04-22 DIAGNOSIS — G5603 Carpal tunnel syndrome, bilateral upper limbs: Secondary | ICD-10-CM | POA: Diagnosis not present

## 2020-04-22 NOTE — Progress Notes (Signed)
OFFICE VISIT  04/22/2020  CC:  Chief Complaint  Patient presents with  . caepal tunnerl    4 week f/u    HPI:    Patient is a 40 y.o. Caucasian male who presents for 1 mo f/u bilat carpal tunnel syndrome. A/P as of last visit: "Bilat CTS. Needs to get back into wearing splints and do it more consistently/longer. Fitted with splints in office today. Instructions: Apply ice packs to your wrists for 15 minutes twice per day. Wear your wrist splints at all times when not using your hands at [facility] wear them at night when sleeping. If he's not improving in 1 wk then will refer to specialist for consideration of steroid inj into CT bilat. Discussed with pt that it may take 4-6 wks of wearing splints regularly to get signif lasting imp."  INTERIM HX: Some better but still bothering him significantly.  Wearing splints all the time except when at work 8 hrs each day. 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 Can still feel numbness sensation in both wrists and palmar surfaces+ tips of 1st 3 fingers on each hand.  Sometimes discomfort/pain in same distribution.    Past Medical History:  Diagnosis Date  . ADHD (attention deficit hyperactivity disorder)   . Anxiety   . Bipolar disorder (HCC)   . Cervical radiculopathy   . Dizziness    lightheaded  . Food allergy    pineapple  . GERD (gastroesophageal reflux disease)   . Headache syndrome   . Hepatic steatosis    u/s and CT confirmed 2020--no cirrhosis.  . Hypertension   . Hypertriglyceridemia    Very high (>800 while on vascepa and rosuva 06/2019)->rosuva and vascepa d/c'd and gemfibrozil started, also referred pt to advanced lipid clinic  . IBS (irritable bowel syndrome)    D.  bentyl helping ([facility])  . Latex allergy   . Low back pain   . Mixed hyperlipidemia   . OSA on CPAP 10/16/2016  . Tremors of nervous system    arms, dx'd as lasting side effect from lithium use in remote past:  psych has him on amantadine for this.  . Vertigo     Past Surgical History:  Procedure Laterality Date  . CHOLECYSTECTOMY N/A 12/26/2016   Procedure: LAPAROSCOPIC CHOLECYSTECTOMY;  Surgeon: Franky Macho, MD;  Location: [facility];  Service: General;  Laterality: N/A;  . KNEE SURGERY Right 1997   arthroscopic  . polysomnogram  09/2016   Mild/mod OSA->CPAP (Dr. Lestine Mount)  . WISDOM TOOTH EXTRACTION  1997    Outpatient Medications Prior to Visit  Medication Sig Dispense Refill  . ABILIFY 10 MG tablet Take 5 mg by mouth 2 (two) times daily.     Marland Kitchen amantadine (SYMMETREL) 100 MG capsule Take 100 mg by mouth 2 (two) times daily.     . clonazePAM (KLONOPIN) 0.5 MG tablet Take 0.25-0.5 mg by mouth 2 (two) times daily. Takes 1/2 tablet in am and 1 tablet at bedtime    . dicyclomine (BENTYL) 10 MG capsule TAKE 1 CAPSULE BY MOUTH UP TO 3 TIMES DAILY WITH MEALS AND AT BEDTIME AS NEEDED. HOLD FOR CONSTIPATION. 120 capsule 1  . fexofenadine (ALLEGRA) 180 MG tablet Take 1 tablet (180 mg total) by mouth daily. 90 tablet 1  . fluticasone (FLONASE) 50 MCG/ACT nasal spray USE 2 SPRAYS IN EACH NOSTRIL DAILY. 16 g 0  . gemfibrozil (LOPID) 600 MG tablet TAKE 1 TABLET BY MOUTH TWICE DAILY BEFORE A  MEAL. 60 tablet 10  . icosapent Ethyl (VASCEPA) 1 g capsule Take 2 capsules (2 g total) by mouth 2 (two) times daily. 360 capsule 3  . lisinopril (ZESTRIL) 10 MG tablet TAKE 1 TABLET BY MOUTH ONCE A DAY. 90 tablet 3  . metoprolol tartrate (LOPRESSOR) 25 MG tablet TAKE (1) TABLET BY MOUTH TWICE DAILY. 180 tablet 0  . Multiple Vitamin (MULTIVITAMIN) tablet Take 1 tablet by mouth daily.    . pantoprazole (PROTONIX) 40 MG tablet TAKE 1 TABLET BY MOUTH ONCE DAILY BEFORE BREAKFAST. 90 tablet 3  . ZOLOFT 100 MG tablet Take 150 mg by mouth daily.      No facility-administered medications prior to visit.    Allergies  Allergen Reactions  . Pineapple Shortness Of Breath and Swelling  . Latex Swelling  . Morphine And Related  Hives and Itching  . Statins     "locks urinary tract up"    ROS As per HPI  PE: Vitals with BMI 04/22/2020 03/25/2020 02/12/2020  Height 5\' 10"  5\' 10"  5\' 10"   Weight 255 lbs 6 oz 261 lbs 13 oz 258 lbs 10 oz  BMI 36.65 37.56 37.11  Systolic 124 121  Diastolic 80 76 68  Pulse 77 86 90   Gen: Alert, well appearing.  Patient is oriented to person, place, time, and situation. AFFECT: pleasant, lucid thought and speech. Wrist, hand, fingers strength all 5/5 bilat.  Normal ROM.  No tenderness. No muscle atrophy. Dec sensation over median nerve distribution of wrists/hands/fingers bilat. Normal tissue color, radial pulses 2+ bilat.  LABS:  Lab Results  Component Value Date   TSH 2.58 01/09/2019   Lab Results  Component Value Date   WBC 5.8 11/05/2019   HGB 13.9 11/05/2019   HCT 39.2 11/05/2019   MCV 89.3 11/05/2019   PLT 249 11/05/2019   Lab Results  Component Value Date   CREATININE 1.00 11/05/2019   BUN 18 11/05/2019   NA 138 11/05/2019   K 5.1 11/05/2019   CL 102 11/05/2019   CO2 26 11/05/2019   Lab Results  Component Value Date   ALT 33 11/05/2019   AST 30 11/05/2019   ALKPHOS 65 07/16/2019   BILITOT 0.4 11/05/2019   Lab Results  Component Value Date   CHOL 168 01/18/2020   Lab Results  Component Value Date   HDL 29 (L) 01/18/2020   Lab Results  Component Value Date   LDLCALC 82 01/18/2020   Lab Results  Component Value Date   TRIG 348 (H) 01/18/2020   Lab Results  Component Value Date   CHOLHDL 5.8 (H) 01/18/2020   Lab Results  Component Value Date   HGBA1C 5.6 07/16/2019   IMPRESSION AND PLAN:  Bilateral CTS; minimal improvement w/splints for the last 1 mo. Refer to ortho for consideration of steroid injections or other treatments or diagnostics.   (HTN: stable. HLD-->Dr. 01/20/2020, gemfib+vascepa. Fatty liver-last lfts good 10/2019. Psych-->stable, multiple meds, has psychiatrist)  An After Visit Summary was printed and given to the  patient.  FOLLOW UP: Return in about 4 months (around 08/22/2020) for annual CPE + RCI (fasting).  Signed:  Rennis Golden, MD           04/22/2020

## 2020-04-28 DIAGNOSIS — R200 Anesthesia of skin: Secondary | ICD-10-CM | POA: Diagnosis not present

## 2020-05-01 IMAGING — CT CT ABDOMEN WO/W CM
3 of 10 series · 11 of 46 positions shown, 17 images · IV contrast (omnipaque)
Comparison: Abdominal ultrasound, 10/24/2018, CT abdomen pelvis,
10/24/2006

CLINICAL DATA: Cirrhosis versus fatty liver, questionable lesion on
prior ultrasound

EXAM:
CT ABDOMEN WITHOUT AND WITH CONTRAST
TECHNIQUE: Multidetector CT imaging of the abdomen was performed following the
standard protocol before and following the bolus administration of
intravenous contrast.
CONTRAST:  100mL OMNIPAQUE IOHEXOL 300 MG/ML  SOLN

[Series 4: axial arterial · axial · arterial · 0.84mm/px · z∈[+1040,+1139]mm · 3 of 100 slices shown]
[im 17/100  soft-tissue]
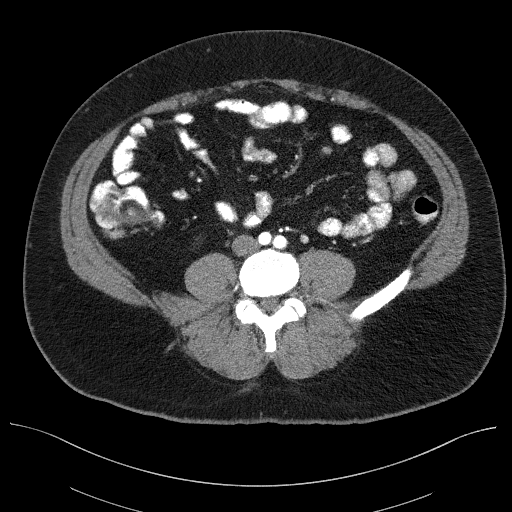
[im 34/100  soft-tissue]
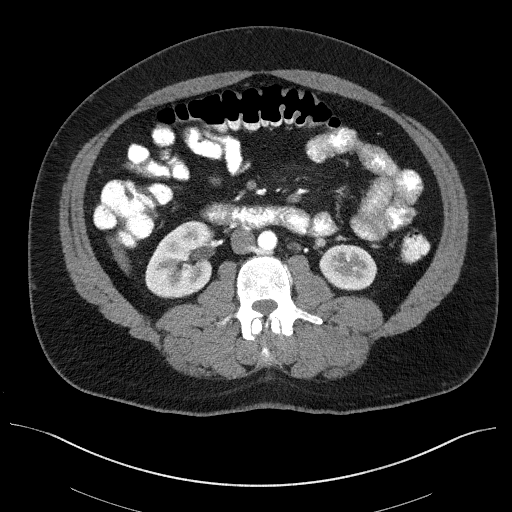
[im 50/100  soft-tissue]
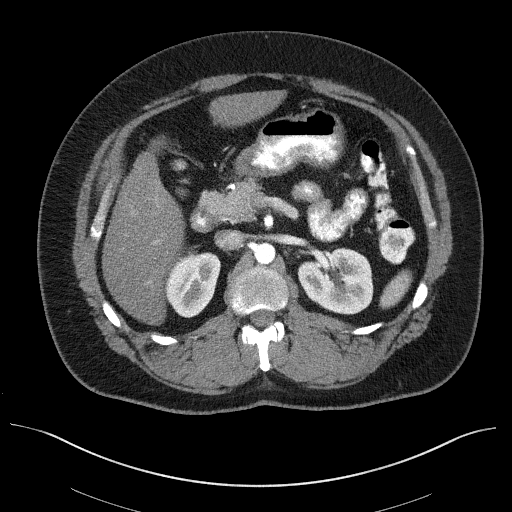

[Series 5: axial venous · axial · portal-venous · 0.83mm/px · z∈[+1040,+1238]mm · 5 of 100 slices shown, 10 images]
[im 17/100  soft-tissue]
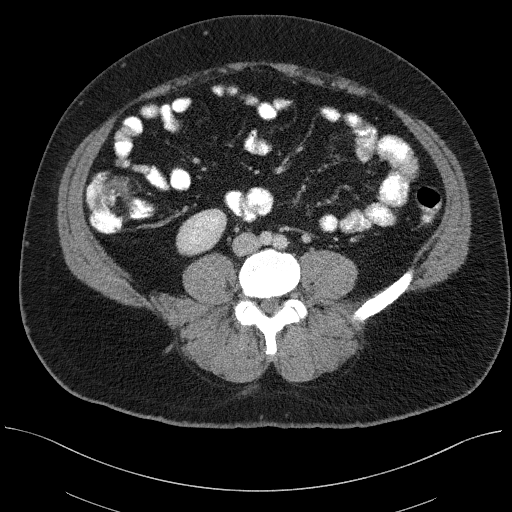
[im 17/100  bone]
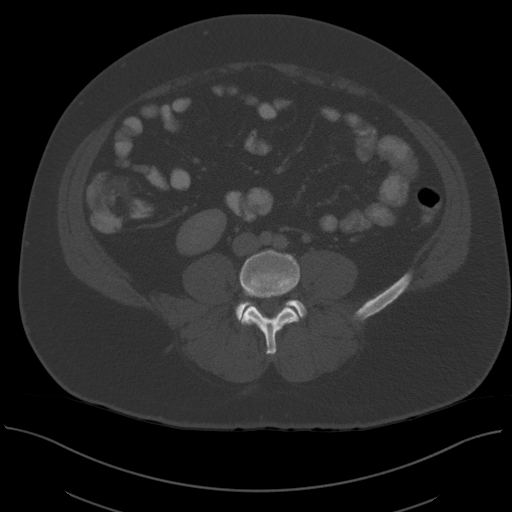
[im 34/100  soft-tissue]
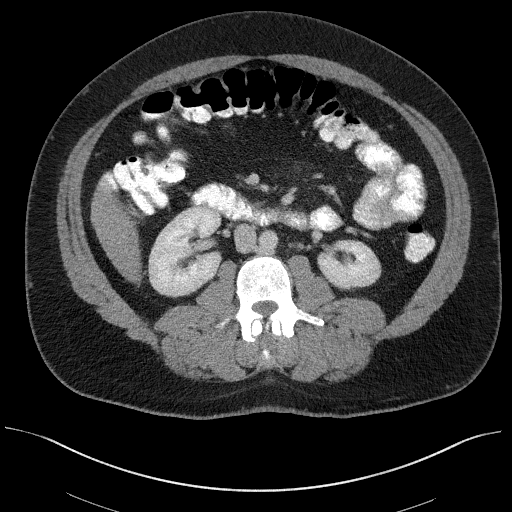
[im 34/100  lung]
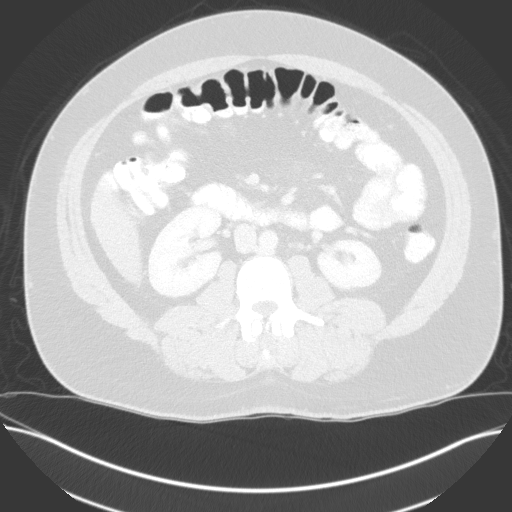
[im 50/100  soft-tissue]
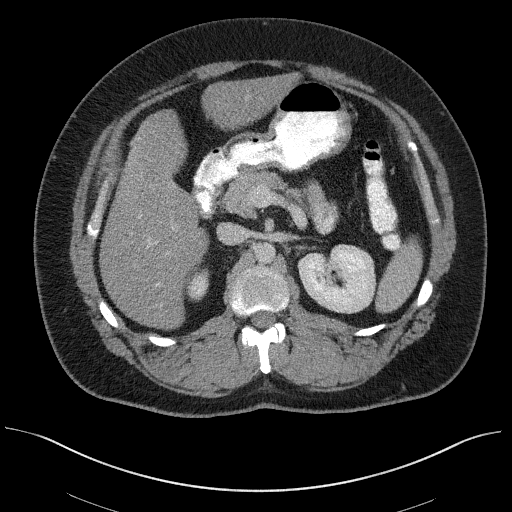
[im 50/100  lung]
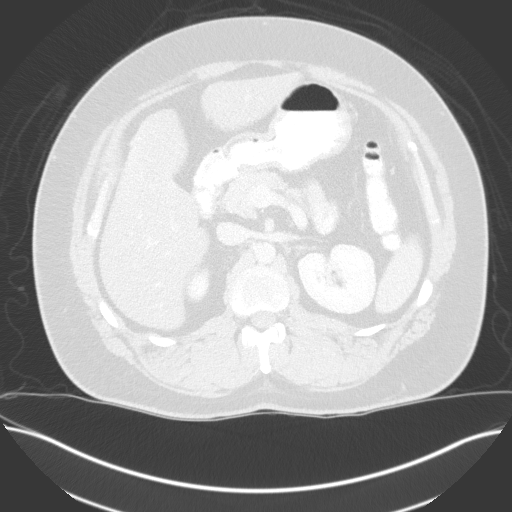
[im 67/100  soft-tissue]
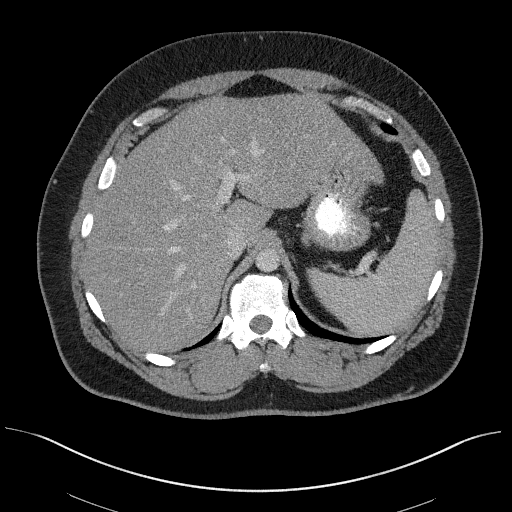
[im 67/100  lung]
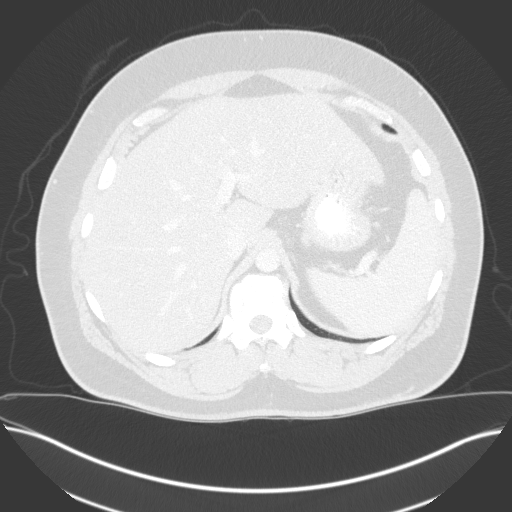
[im 83/100  soft-tissue]
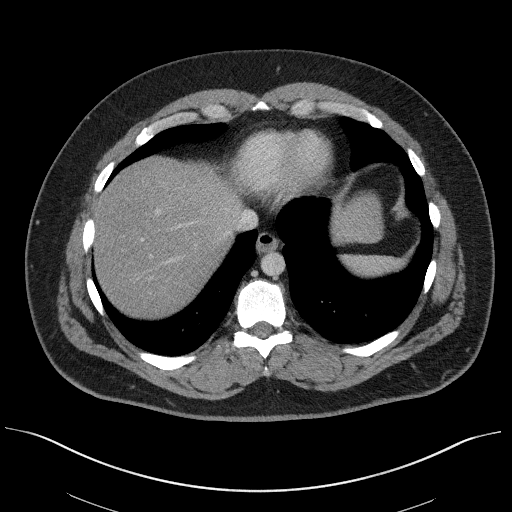
[im 83/100  lung]
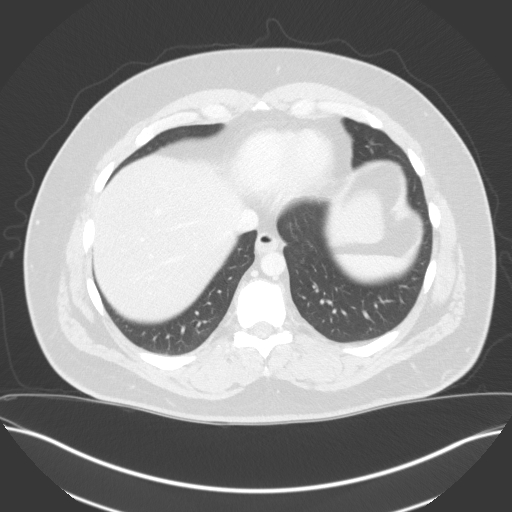

[Series 12: coronal venous · coronal · portal-venous · 0.60mm/px · 3 of 123 slices shown, 4 images]
[im 31/123  soft-tissue]
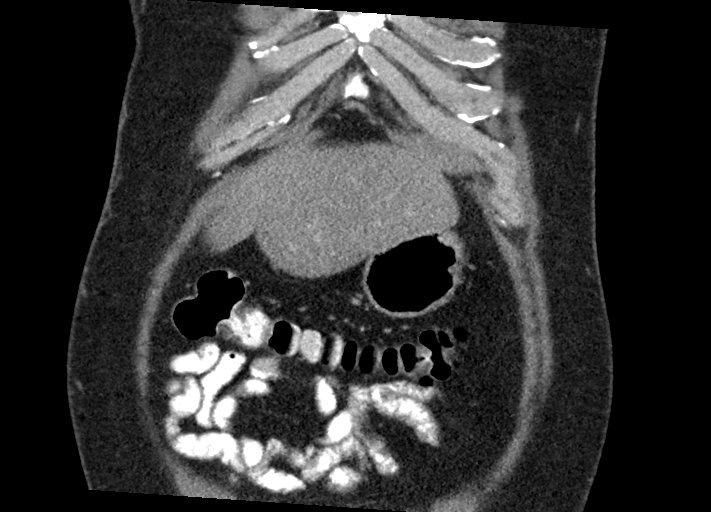
[im 62/123  soft-tissue]
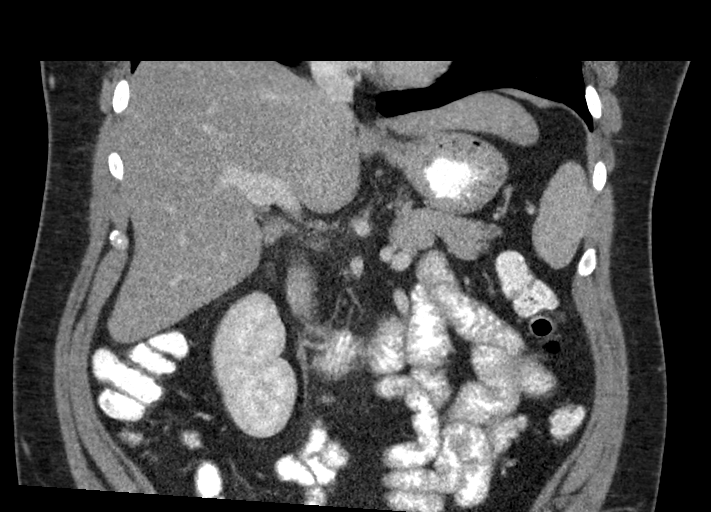
[im 62/123  bone]
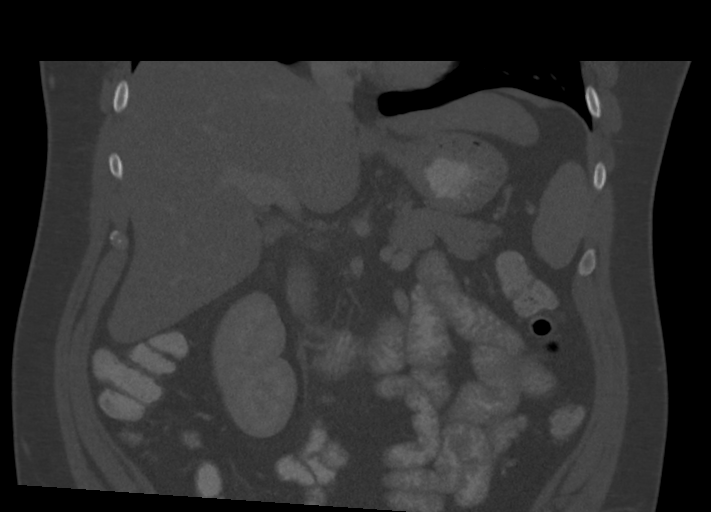
[im 92/123  soft-tissue]
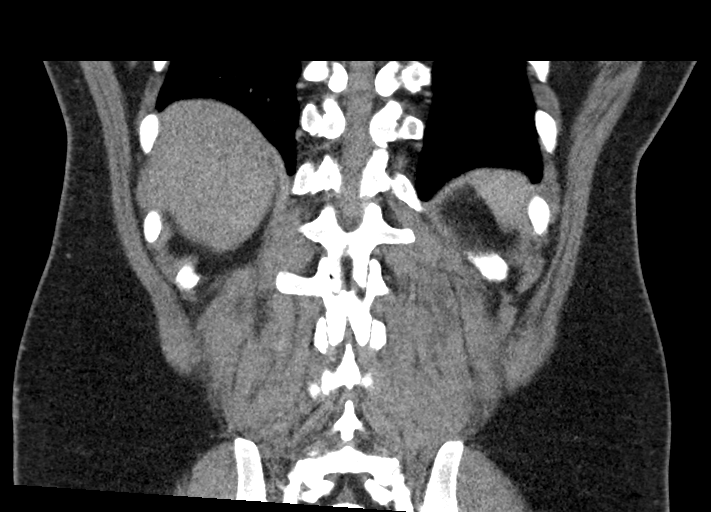

[11 of 46 positions shown; findings below may reference images not displayed]

FINDINGS: Lower chest: No acute abnormality.

Hepatobiliary: No focal liver abnormality is seen. Diffuse hepatic
steatosis. There is no focal lesion identified of the left lobe
corresponding to finding of prior ultrasound. Status post
cholecystectomy. No biliary dilatation.

Pancreas: Unremarkable. No pancreatic ductal dilatation or
surrounding inflammatory changes.

Spleen: Normal in size without significant abnormality.

Adrenals/Urinary Tract: Adrenal glands are unremarkable. Kidneys are
normal, without renal calculi, solid lesion, or hydronephrosis.
Bladder is unremarkable.

Stomach/Bowel: Stomach is within normal limits. Appendix appears
normal. No evidence of bowel wall thickening, distention, or
inflammatory changes.

Vascular/Lymphatic: No significant vascular findings are present. No
enlarged abdominal or pelvic lymph nodes.

Reproductive: No mass or other significant abnormality.

Other: No abdominal wall hernia or abnormality. No abdominopelvic
ascites.

Musculoskeletal: No acute or significant osseous findings.
IMPRESSION: 1.  Hepatic steatosis.

2. No focal lesion identified of the left lobe of the liver to
correspond to findings of prior ultrasound. No suspicious hepatic
contrast enhancement.

3.  No specific stigmata of hepatic cirrhosis.

4.  Status post cholecystectomy.

## 2020-05-03 DIAGNOSIS — F902 Attention-deficit hyperactivity disorder, combined type: Secondary | ICD-10-CM | POA: Diagnosis not present

## 2020-05-03 DIAGNOSIS — F4011 Social phobia, generalized: Secondary | ICD-10-CM | POA: Diagnosis not present

## 2020-05-03 DIAGNOSIS — F3132 Bipolar disorder, current episode depressed, moderate: Secondary | ICD-10-CM | POA: Diagnosis not present

## 2020-05-05 ENCOUNTER — Ambulatory Visit: Payer: Medicare Other | Admitting: Nurse Practitioner

## 2020-05-05 ENCOUNTER — Encounter: Payer: Self-pay | Admitting: Family Medicine

## 2020-05-11 ENCOUNTER — Other Ambulatory Visit: Payer: Self-pay | Admitting: Family Medicine

## 2020-05-24 ENCOUNTER — Encounter: Payer: Self-pay | Admitting: Gastroenterology

## 2020-05-24 ENCOUNTER — Other Ambulatory Visit: Payer: Self-pay | Admitting: Gastroenterology

## 2020-05-24 ENCOUNTER — Other Ambulatory Visit: Payer: Self-pay

## 2020-05-24 ENCOUNTER — Ambulatory Visit (INDEPENDENT_AMBULATORY_CARE_PROVIDER_SITE_OTHER): Payer: Medicare Other | Admitting: Gastroenterology

## 2020-05-24 VITALS — BP 122/76 | HR 87 | Temp 97.1°F | Ht 70.0 in | Wt 259.0 lb

## 2020-05-24 DIAGNOSIS — K760 Fatty (change of) liver, not elsewhere classified: Secondary | ICD-10-CM | POA: Diagnosis not present

## 2020-05-24 DIAGNOSIS — K219 Gastro-esophageal reflux disease without esophagitis: Secondary | ICD-10-CM

## 2020-05-24 DIAGNOSIS — K580 Irritable bowel syndrome with diarrhea: Secondary | ICD-10-CM

## 2020-05-24 NOTE — Progress Notes (Signed)
Referring Provider: Jeoffrey Massed, MD Primary Care Physician:  Jeoffrey Massed, MD Primary GI: Dr. Jena Gauss   Chief Complaint  Patient presents with  . Constipation  . Diarrhea  . Gastroesophageal Reflux    Doing ok    HPI:   NHIA HEAPHY is a 40 y.o. male presenting today with a history of IBS, GERD, fatty liver with some concern for coarsened echotexture in Sept 2020 but CT in Oct 2020 without stigmata of advanced liver disease. Spleen normal. No thrombocytopenia. Here for routine follow-up.  GERD managed well on Protonix once daily. IBS symptoms at baseline. Has occasional looser stool and will take dicyclomine once or twice a week at the most. No rectal bleeding. No unexplained weight loss or lack of appetite. No N/V, dysphagia, abdominal pain. No ETOH. Wants to exercise more now that it is warmer weather.   Past Medical History:  Diagnosis Date  . ADHD (attention deficit hyperactivity disorder)   . Anxiety   . Bilateral carpal tunnel syndrome    Inj's bilat by ortho 04/2020  . Bipolar disorder (HCC)   . Cervical radiculopathy   . Dizziness    lightheaded  . Food allergy    pineapple  . GERD (gastroesophageal reflux disease)   . Headache syndrome   . Hepatic steatosis    u/s and CT confirmed 2020--no cirrhosis.  . Hypertension   . Hypertriglyceridemia    Very high (>800 while on vascepa and rosuva 06/2019)->rosuva and vascepa d/c'd and gemfibrozil started, also referred pt to advanced lipid clinic  . IBS (irritable bowel syndrome)    D.  bentyl helping ([facility])  . Latex allergy   . Low back pain   . Mixed hyperlipidemia   . OSA on CPAP 10/16/2016  . Tremors of nervous system    arms, dx'd as lasting side effect from lithium use in remote past: psych has him on amantadine for this.  . Vertigo     Past Surgical History:  Procedure Laterality Date  . CHOLECYSTECTOMY N/A 12/26/2016   Procedure: LAPAROSCOPIC CHOLECYSTECTOMY;  Surgeon:  Franky Macho, MD;  Location: [facility];  Service: General;  Laterality: N/A;  . KNEE SURGERY Right 1997   arthroscopic  . polysomnogram  09/2016   Mild/mod OSA->CPAP (Dr. Lestine Mount)  . WISDOM TOOTH EXTRACTION  1997    Current Outpatient Medications  Medication Sig Dispense Refill  . ABILIFY 10 MG tablet Take 5 mg by mouth 2 (two) times daily.     Marland Kitchen amantadine (SYMMETREL) 100 MG capsule Take 100 mg by mouth 2 (two) times daily.     . clonazePAM (KLONOPIN) 0.5 MG tablet Take 0.25-0.5 mg by mouth 2 (two) times daily. Takes 1/2 tablet in am and 1 tablet at bedtime    . dicyclomine (BENTYL) 10 MG capsule TAKE 1 CAPSULE BY MOUTH UP TO 3 TIMES DAILY WITH MEALS AND AT BEDTIME AS NEEDED. HOLD FOR CONSTIPATION. 120 capsule 1  . fexofenadine (ALLEGRA) 180 MG tablet Take 1 tablet (180 mg total) by mouth daily. 90 tablet 1  . fluticasone (FLONASE) 50 MCG/ACT nasal spray USE 2 SPRAYS IN EACH NOSTRIL DAILY. 16 g 2  . gemfibrozil (LOPID) 600 MG tablet TAKE 1 TABLET BY MOUTH TWICE DAILY BEFORE A MEAL. 60 tablet 10  . icosapent Ethyl (VASCEPA) 1 g capsule Take 2 capsules (2 g total) by mouth 2 (two) times daily. 360 capsule 3  . lisinopril (ZESTRIL) 10 MG tablet TAKE 1 TABLET BY MOUTH ONCE  A DAY. 90 tablet 3  . metoprolol tartrate (LOPRESSOR) 25 MG tablet TAKE (1) TABLET BY MOUTH TWICE DAILY. 180 tablet 0  . Multiple Vitamin (MULTIVITAMIN) tablet Take 1 tablet by mouth daily.    . pantoprazole (PROTONIX) 40 MG tablet TAKE 1 TABLET BY MOUTH ONCE DAILY BEFORE BREAKFAST. 90 tablet 3  . ZOLOFT 100 MG tablet Take 200 mg by mouth daily.     No current facility-administered medications for this visit.    Allergies as of 05/24/2020 - Review Complete 05/24/2020  Allergen Reaction Noted  . Pineapple Shortness Of Breath and Swelling 05/14/2014  . Latex Swelling 05/14/2014  . Morphine and related Hives and Itching 05/14/2014  . Statins  12/30/2018    Family History  Adopted: Yes  Problem Relation Age of Onset   . High blood pressure Mother   . Seizures Mother   . Colon cancer Neg Hx     Social History   Socioeconomic History  . Marital status: Divorced    Spouse name: Not on file  . Number of children: 0  . Years of education: 37  . Highest education level: Not on file  Occupational History  . Not on file  Tobacco Use  . Smoking status: Former Smoker    Packs/day: 1.00    Years: 10.00    Pack years: 10.00    Types: Cigarettes  . Smokeless tobacco: Current User    Types: Snuff  . Tobacco comment: quit Nov 2018  Vaping Use  . Vaping Use: Never used  Substance and Sexual Activity  . Alcohol use: Yes    Alcohol/week: 1.0 standard drink    Types: 1 Cans of beer per week    Comment: heavy in past, short period of time in 20s.; 05/24/20 occ  . Drug use: No    Comment: Quit 1996  . Sexual activity: Not on file  Other Topics Concern  . Not on file  Social History Narrative   Divorced, no children.   Educ: college at [employer]   Patient lives at home with his father and unemployed.  Disability for psych reasons.   Education some college.   Both handed.   Caffeine three cups daily soda and coffee.   Alc: occ beer (remote hx of alc abuse, marijuana, acid--no IV drugs) in 2005-2006, surround his problems coping with GF's death.   Tobacco: former cig smoker, 20 pack-yr hx , quit 2018.   Social Determinants of Health   Financial Resource Strain: Low Risk   . Difficulty of Paying Living Expenses: Not hard at all  Food Insecurity: No Food Insecurity  . Worried About Programme researcher, broadcasting/film/video in the Last Year: Never true  . Ran Out of Food in the Last Year: Never true  Transportation Needs: No Transportation Needs  . Lack of Transportation (Medical): No  . Lack of Transportation (Non-Medical): No  Physical Activity: Inactive  . Days of Exercise per Week: 0 days  . Minutes of Exercise per Session: 0 min  Stress: No Stress Concern Present  . Feeling of Stress : Not at all  Social Connections:  Moderately Integrated  . Frequency of Communication with Friends and Family: More than three times a week  . Frequency of Social Gatherings with Friends and Family: More than three times a week  . Attends Religious Services: More than 4 times per year  . Active Member of Clubs or Organizations: No  . Attends Banker Meetings: Never  . Marital Status: Married  Review of Systems: Gen: Denies fever, chills, anorexia. Denies fatigue, weakness, weight loss.  CV: Denies chest pain, palpitations, syncope, peripheral edema, and claudication. Resp: Denies dyspnea at rest, cough, wheezing, coughing up blood, and pleurisy. GI: see HPI Derm: Denies rash, itching, dry skin Psych: Denies depression, anxiety, memory loss, confusion. No homicidal or suicidal ideation.  Heme: Denies bruising, bleeding, and enlarged lymph nodes.  Physical Exam: BP 122/76   Pulse 87   Temp (!) 97.1 F (36.2 C) (Temporal)   Ht 5\' 10"  (1.778 m)   Wt 259 lb (117.5 kg)   BMI 37.16 kg/m  General:   Alert and oriented. No distress noted. Pleasant and cooperative.  Head:  Normocephalic and atraumatic. Eyes:  Conjuctiva clear without scleral icterus. Mouth:  Mask in place Abdomen:  +BS, soft, non-tender and non-distended. No rebound or guarding. No HSM or masses noted. Msk:  Symmetrical without gross deformities. Normal posture. Extremities:  Without edema. Neurologic:  Alert and  oriented x4 Psych:  Alert and cooperative. Normal mood and affect.  ASSESSMENT: Bobby Jimenez is a 40 y.o. male presenting today with history of GERD, IBS with predominant diarrhea, and fatty liver, for routine follow-up.  GERD: well-controlled on Protonix once daily. No alarm signs/symptoms.  IBS: occasional dicyclomine but overall doing well. No rectal bleeding.  Fatty liver: no stigmata of advanced liver disease. LFTs in Nov 2021 normal. Continue to follow yearly. Discussed avoidance of alcohol, dietary, and behavior  modification.    PLAN:  Continue Protonix daily Dicyclomine as needed Diet/exercise/behavior modification due to fatty liver Return in 1 year or sooner if needed   Dec 2021, PhD, ANP-BC [facility]

## 2020-05-24 NOTE — Patient Instructions (Signed)
I am glad you are doing well!  Continue Protonix once daily.  We will see you back in 1 year or sooner if needed! Call if any abdominal pain, nausea, vomiting, problems swallowing, blood in stool, changes in bowel habits.  Have a great summer!  It was a pleasure to see you today. I want to create trusting relationships with patients to provide genuine, compassionate, and quality care. I value your feedback. If you receive a survey regarding your visit,  I greatly appreciate you taking time to fill this out.   Gelene Mink, PhD, ANP-BC [facility]

## 2020-05-31 DIAGNOSIS — R200 Anesthesia of skin: Secondary | ICD-10-CM | POA: Diagnosis not present

## 2020-06-08 DIAGNOSIS — G5601 Carpal tunnel syndrome, right upper limb: Secondary | ICD-10-CM | POA: Diagnosis not present

## 2020-06-08 DIAGNOSIS — G5602 Carpal tunnel syndrome, left upper limb: Secondary | ICD-10-CM | POA: Diagnosis not present

## 2020-06-14 ENCOUNTER — Ambulatory Visit: Payer: Medicare Other | Admitting: Nutrition

## 2020-06-21 DIAGNOSIS — G5602 Carpal tunnel syndrome, left upper limb: Secondary | ICD-10-CM | POA: Diagnosis not present

## 2020-06-21 DIAGNOSIS — G5601 Carpal tunnel syndrome, right upper limb: Secondary | ICD-10-CM | POA: Diagnosis not present

## 2020-06-29 ENCOUNTER — Encounter: Payer: Self-pay | Admitting: Family Medicine

## 2020-07-01 DIAGNOSIS — G5602 Carpal tunnel syndrome, left upper limb: Secondary | ICD-10-CM | POA: Diagnosis not present

## 2020-07-14 DIAGNOSIS — F3132 Bipolar disorder, current episode depressed, moderate: Secondary | ICD-10-CM | POA: Diagnosis not present

## 2020-07-14 DIAGNOSIS — F902 Attention-deficit hyperactivity disorder, combined type: Secondary | ICD-10-CM | POA: Diagnosis not present

## 2020-07-14 DIAGNOSIS — F4011 Social phobia, generalized: Secondary | ICD-10-CM | POA: Diagnosis not present

## 2020-07-22 DIAGNOSIS — G5601 Carpal tunnel syndrome, right upper limb: Secondary | ICD-10-CM | POA: Diagnosis not present

## 2020-07-22 DIAGNOSIS — G5602 Carpal tunnel syndrome, left upper limb: Secondary | ICD-10-CM | POA: Diagnosis not present

## 2020-07-26 ENCOUNTER — Encounter: Payer: Self-pay | Admitting: Family Medicine

## 2020-08-05 ENCOUNTER — Other Ambulatory Visit: Payer: Self-pay | Admitting: Family Medicine

## 2020-08-22 ENCOUNTER — Ambulatory Visit (INDEPENDENT_AMBULATORY_CARE_PROVIDER_SITE_OTHER): Payer: Medicare Other | Admitting: Family Medicine

## 2020-08-22 ENCOUNTER — Other Ambulatory Visit: Payer: Self-pay

## 2020-08-22 ENCOUNTER — Encounter: Payer: Self-pay | Admitting: Family Medicine

## 2020-08-22 VITALS — BP 118/72 | HR 75 | Temp 97.6°F | Resp 16 | Ht 70.0 in | Wt 261.2 lb

## 2020-08-22 DIAGNOSIS — I10 Essential (primary) hypertension: Secondary | ICD-10-CM | POA: Diagnosis not present

## 2020-08-22 DIAGNOSIS — Z0000 Encounter for general adult medical examination without abnormal findings: Secondary | ICD-10-CM

## 2020-08-22 DIAGNOSIS — K760 Fatty (change of) liver, not elsewhere classified: Secondary | ICD-10-CM | POA: Diagnosis not present

## 2020-08-22 DIAGNOSIS — Z1159 Encounter for screening for other viral diseases: Secondary | ICD-10-CM

## 2020-08-22 DIAGNOSIS — E781 Pure hyperglyceridemia: Secondary | ICD-10-CM

## 2020-08-22 MED ORDER — FLUTICASONE PROPIONATE 50 MCG/ACT NA SUSP: NASAL | 11 refills | Status: DC

## 2020-08-22 NOTE — Progress Notes (Signed)
Office Note 08/22/2020  CC:  Chief Complaint  Patient presents with   Follow-up    RCI, pt is not fasting.   HPI:  Bobby Jimenez is a 40 y.o. White male who is here for annual health maintenance exam and 4 mo f/u HTN, hypertriglyceridemia, and obesity with hepatic steatosis. A/P as of last visit: "Bilateral CTS; minimal improvement w/splints for the last 1 mo. Refer to ortho for consideration of steroid injections or other treatments or diagnostics.     (HTN: stable. HLD-->Dr. Rennis GoldenHilty, gemfib+vascepa. Fatty liver-last lfts good 10/2019. Psych-->stable, multiple meds, has psychiatrist)"  INTERIM HX: Feeling well. Not working anymore--resigned b/c employer not treating him well.   Followed by Dr. Jannifer FranklinAkintayo in psychiatry for bipolar d/o and anxiety: zoloft, clonazepam, abilify, amantadine. He got bilat CT release since I saw him last and [facility]  HLD: trigs down to 348 on vascepa and gemfibrozil last check back in 12/2019.  Dr. Rennis GoldenHilty with lipid clinic is following.  Apolipoprotein level had come down some. He is active but doing no formal exercise.  Diet improved: much less sweet tea, more greens and more water.  Past Medical History:  Diagnosis Date   ADHD (attention deficit hyperactivity disorder)    Anxiety    Bilateral carpal tunnel syndrome    transient relief from inj by ortho 04/2020; NCS confirmed bilat CTS->surgery planned as of 05/2020 ortho f/u   Bipolar disorder (HCC)    Cervical radiculopathy    Dizziness    lightheaded   Food allergy    pineapple   GERD (gastroesophageal reflux disease)    Headache syndrome    Hepatic steatosis    u/s and CT confirmed 2020--no cirrhosis.   Hypertension    Hypertriglyceridemia    Very high (>800 while on vascepa and rosuva 06/2019)->rosuva and vascepa d/c'd and gemfibrozil started, also referred pt to advanced lipid clinic   IBS (irritable bowel syndrome)    D.  bentyl helping ([facility])   Latex  allergy    Low back pain    Mixed hyperlipidemia    OSA on CPAP 10/16/2016   Tremors of nervous system    arms, dx'd as lasting side effect from lithium use in remote past: psych has him on amantadine for this.   Vertigo     Past Surgical History:  Procedure Laterality Date   CARPAL TUNNEL RELEASE Left    L 05/2020.  Plan for R side to be done.   CARPAL TUNNEL RELEASE Right    07/2020   CHOLECYSTECTOMY N/A 12/26/2016   Procedure: LAPAROSCOPIC CHOLECYSTECTOMY;  Surgeon: Franky MachoJenkins, Mark, MD;  Location: [facility];  Service: General;  Laterality: N/A;   KNEE SURGERY Right 1997   arthroscopic   polysomnogram  09/2016   Mild/mod OSA->CPAP (Dr. Lestine Mountoonqhah)   WISDOM TOOTH EXTRACTION  1997    Family History  Adopted: Yes  Problem Relation Age of Onset   High blood pressure Mother    Seizures Mother    Colon cancer Neg Hx     Social History   Socioeconomic History   Marital status: Divorced    Spouse name: Not on file   Number of children: 0   Years of education: 13   Highest education level: Not on file  Occupational History   Not on file  Tobacco Use   Smoking status: Former    Packs/day: 1.00    Years: 10.00    Pack years: 10.00    Types: Cigarettes  Smokeless tobacco: Current    Types: Snuff   Tobacco comments:    quit Nov 2018  Vaping Use   Vaping Use: Never used  Substance and Sexual Activity   Alcohol use: Yes    Alcohol/week: 1.0 standard drink    Types: 1 Cans of beer per week    Comment: heavy in past, short period of time in 20s.; 05/24/20 occ   Drug use: No    Comment: Quit 1996   Sexual activity: Not on file  Other Topics Concern   Not on file  Social History Narrative   Divorced, no children.   Educ: college at [employer]   Patient lives at home with his father and unemployed.  Disability for psych reasons.   Education some college.   Both handed.   Caffeine three cups daily soda and coffee.   Alc: occ beer (remote hx of alc abuse, marijuana, acid--no IV  drugs) in 2005-2006, surround his problems coping with GF's death.   Tobacco: former cig smoker, 20 pack-yr hx , quit 2018.   Social Determinants of Health   Financial Resource Strain: Low Risk    Difficulty of Paying Living Expenses: Not hard at all  Food Insecurity: No Food Insecurity   Worried About Programme researcher, broadcasting/film/video in the Last Year: Never true   Ran Out of Food in the Last Year: Never true  Transportation Needs: No Transportation Needs   Lack of Transportation (Medical): No   Lack of Transportation (Non-Medical): No  Physical Activity: Inactive   Days of Exercise per Week: 0 days   Minutes of Exercise per Session: 0 min  Stress: No Stress Concern Present   Feeling of Stress : Not at all  Social Connections: Moderately Integrated   Frequency of Communication with Friends and Family: More than three times a week   Frequency of Social Gatherings with Friends and Family: More than three times a week   Attends Religious Services: More than 4 times per year   Active Member of [employer] or Organizations: No   Attends Banker Meetings: Never   Marital Status: Married  Catering manager Violence: Not At Risk   Fear of Current or Ex-Partner: No   Emotionally Abused: No   Physically Abused: No   Sexually Abused: No    Outpatient Medications Prior to Visit  Medication Sig Dispense Refill   ABILIFY 10 MG tablet Take 5 mg by mouth 2 (two) times daily.      amantadine (SYMMETREL) 100 MG capsule Take 100 mg by mouth 2 (two) times daily.      clonazePAM (KLONOPIN) 0.5 MG tablet Take 0.25-0.5 mg by mouth 2 (two) times daily. Takes 1/2 tablet in am and 1 tablet at bedtime     dicyclomine (BENTYL) 10 MG capsule TAKE 1 CAPSULE BY MOUTH 4 TIMES DAILY WITH MEALS AND AT BEDTIME AS NEEDED. HOLD FOR CONSTIPATION. 120 capsule 0   gemfibrozil (LOPID) 600 MG tablet TAKE 1 TABLET BY MOUTH TWICE DAILY BEFORE A MEAL. 60 tablet 10   icosapent Ethyl (VASCEPA) 1 g capsule Take 2 capsules (2 g  total) by mouth 2 (two) times daily. 360 capsule 3   lisinopril (ZESTRIL) 10 MG tablet TAKE 1 TABLET BY MOUTH ONCE A DAY. 90 tablet 3   metoprolol tartrate (LOPRESSOR) 25 MG tablet TAKE (1) TABLET BY MOUTH TWICE DAILY. 180 tablet 0   Multiple Vitamin (MULTIVITAMIN) tablet Take 1 tablet by mouth daily.     pantoprazole (PROTONIX) 40 MG  tablet TAKE 1 TABLET BY MOUTH ONCE DAILY BEFORE BREAKFAST. 90 tablet 3   ZOLOFT 100 MG tablet Take 200 mg by mouth daily.     fluticasone (FLONASE) 50 MCG/ACT nasal spray INSTILL 2 SPRAYS INTO EACH NOSTRIL DAILY. 16 g 0   fexofenadine (ALLEGRA) 180 MG tablet Take 1 tablet (180 mg total) by mouth daily. 90 tablet 1   No facility-administered medications prior to visit.    Allergies  Allergen Reactions   Pineapple Shortness Of Breath and Swelling   Latex Swelling   Morphine And Related Hives and Itching   Statins     "locks urinary tract up"    ROS Review of Systems  PE; Vitals with BMI 08/22/2020 05/24/2020 04/22/2020  Height 5\' 10"  5\' 10"  5\' 10"   Weight 261 lbs 3 oz 259 lbs 255 lbs 6 oz  BMI 37.48 37.16 36.65  Systolic 118 122  Diastolic 72 76 80  Pulse 75 87 77   Gen: Alert, well appearing.  Patient is oriented to person, place, time, and situation. AFFECT: pleasant, lucid thought and speech. ENT: Ears: EACs clear, normal epithelium.  TMs with good light reflex and landmarks bilaterally.  Eyes: no injection, icteris, swelling, or exudate.  EOMI, PERRLA. Nose: no drainage or turbinate edema/swelling.  No injection or focal lesion.  Mouth: lips without lesion/swelling.  Oral mucosa pink and moist.  Dentition intact and without obvious caries or gingival swelling.  Oropharynx without erythema, exudate, or swelling.  Neck: supple/nontender.  No LAD, mass, or TM.  Carotid pulses 2+ bilaterally, without bruits. CV: RRR, no m/r/g.   LUNGS: CTA bilat, nonlabored resps, good aeration in all lung fields. ABD: soft, NT, ND, BS normal.  No  hepatospenomegaly or mass.  No bruits. EXT: no clubbing, cyanosis, or edema.  Musculoskeletal: no joint swelling, erythema, warmth, or tenderness.  ROM of all joints intact. Skin - no sores or suspicious lesions or rashes or color changes  Pertinent labs:  Lab Results  Component Value Date   TSH 2.58 01/09/2019   Lab Results  Component Value Date   WBC 5.8 11/05/2019   HGB 13.9 11/05/2019   HCT 39.2 11/05/2019   MCV 89.3 11/05/2019   PLT 249 11/05/2019   Lab Results  Component Value Date   CREATININE 1.00 11/05/2019   BUN 18 11/05/2019   NA 138 11/05/2019   K 5.1 11/05/2019   CL 102 11/05/2019   CO2 26 11/05/2019   Lab Results  Component Value Date   ALT 33 11/05/2019   AST 30 11/05/2019   ALKPHOS 65 07/16/2019   BILITOT 0.4 11/05/2019   Lab Results  Component Value Date   CHOL 168 01/18/2020   Lab Results  Component Value Date   HDL 29 (L) 01/18/2020   Lab Results  Component Value Date   LDLCALC 82 01/18/2020   Lab Results  Component Value Date   TRIG 348 (H) 01/18/2020   Lab Results  Component Value Date   CHOLHDL 5.8 (H) 01/18/2020   Lab Results  Component Value Date   HGBA1C 5.6 07/16/2019   ASSESSMENT AND PLAN:   1) HTN: well controlled with lopressor 25mg  bid and lisinopril 10 qd--future lytes/cr ordered.  2) HLD: improved at last visit 01/2019 with Dr. 01/20/2020. Continuing on vascepa and gemfibrozil. Future lipid panel, Apo B level, and hepatic panel ordered--forward/CC results to Dr. 07/18/2019.  3) Hepatic steatosis, obesity class II. He's continuing to try to make better diet choices and plans on starting  walking program when the hot weather abates some.  4) Health maintenance exam: Reviewed age and gender appropriate health maintenance issues (prudent diet, regular exercise, health risks of tobacco and excessive alcohol, use of seatbelts, fire alarms in home, use of sunscreen).  Also reviewed age and gender appropriate health screening as well  as vaccine recommendations. Vaccines: ALL UTD. Labs: cmet, flp, apolipoprotein B, hep c screening-->future when fasting. Prostate ca screening: average risk patient= as per latest guidelines, start screening at 34 yrs of age. Colon ca screening: average risk patient= as per latest guidelines, start screening at 6 yrs of age.  An After Visit Summary was printed and given to the patient.  FOLLOW UP:  Return in about 6 months (around 02/22/2021) for routine chronic illness f/u.  Signed:  Santiago Bumpers, MD           08/22/2020

## 2020-08-22 NOTE — Patient Instructions (Signed)
Health Maintenance, Male Adopting a healthy lifestyle and getting preventive care are important in promoting health and wellness. Ask your health care provider about: The right schedule for you to have regular tests and exams. Things you can do on your own to prevent diseases and keep yourself healthy. What should I know about diet, weight, and exercise? Eat a healthy diet  Eat a diet that includes plenty of vegetables, fruits, low-fat dairy products, and lean protein. Do not eat a lot of foods that are high in solid fats, added sugars, or sodium.  Maintain a healthy weight Body mass index (BMI) is a measurement that can be used to identify possible weight problems. It estimates body fat based on height and weight. Your health care provider can help determine your BMI and help you achieve or maintain ahealthy weight. Get regular exercise Get regular exercise. This is one of the most important things you can do for your health. Most adults should: Exercise for at least 150 minutes each week. The exercise should increase your heart rate and make you sweat (moderate-intensity exercise). Do strengthening exercises at least twice a week. This is in addition to the moderate-intensity exercise. Spend less time sitting. Even light physical activity can be beneficial. Watch cholesterol and blood lipids Have your blood tested for lipids and cholesterol at 42 years of age, then havethis test every 5 years. You may need to have your cholesterol levels checked more often if: Your lipid or cholesterol levels are high. You are older than 42 years of age. You are at high risk for heart disease. What should I know about cancer screening? Many types of cancers can be detected early and may often be prevented. Depending on your health history and family history, you may need to have cancer screening at various ages. This may include screening for: Colorectal cancer. Prostate cancer. Skin cancer. Lung  cancer. What should I know about heart disease, diabetes, and high blood pressure? Blood pressure and heart disease High blood pressure causes heart disease and increases the risk of stroke. This is more likely to develop in people who have high blood pressure readings, are of African descent, or are overweight. Talk with your health care provider about your target blood pressure readings. Have your blood pressure checked: Every 3-5 years if you are 18-39 years of age. Every year if you are 40 years old or older. If you are between the ages of 65 and 75 and are a current or former smoker, ask your health care provider if you should have a one-time screening for abdominal aortic aneurysm (AAA). Diabetes Have regular diabetes screenings. This checks your fasting blood sugar level. Have the screening done: Once every three years after age 45 if you are at a normal weight and have a low risk for diabetes. More often and at a younger age if you are overweight or have a high risk for diabetes. What should I know about preventing infection? Hepatitis B If you have a higher risk for hepatitis B, you should be screened for this virus. Talk with your health care provider to find out if you are at risk forhepatitis B infection. Hepatitis C Blood testing is recommended for: Everyone born from 1945 through 1965. Anyone with known risk factors for hepatitis C. Sexually transmitted infections (STIs) You should be screened each year for STIs, including gonorrhea and chlamydia, if: You are sexually active and are younger than 42 years of age. You are older than 42 years of age   and your health care provider tells you that you are at risk for this type of infection. Your sexual activity has changed since you were last screened, and you are at increased risk for chlamydia or gonorrhea. Ask your health care provider if you are at risk. Ask your health care provider about whether you are at high risk for HIV.  Your health care provider may recommend a prescription medicine to help prevent HIV infection. If you choose to take medicine to prevent HIV, you should first get tested for HIV. You should then be tested every 3 months for as long as you are taking the medicine. Follow these instructions at home: Lifestyle Do not use any products that contain nicotine or tobacco, such as cigarettes, e-cigarettes, and chewing tobacco. If you need help quitting, ask your health care provider. Do not use street drugs. Do not share needles. Ask your health care provider for help if you need support or information about quitting drugs. Alcohol use Do not drink alcohol if your health care provider tells you not to drink. If you drink alcohol: Limit how much you have to 0-2 drinks a day. Be aware of how much alcohol is in your drink. In the [place] one drink equals one 12 oz bottle of beer (355 mL), one 5 oz glass of wine (148 mL), or one 1 oz glass of hard liquor (44 mL). General instructions Schedule regular health, dental, and eye exams. Stay current with your vaccines. Tell your health care provider if: You often feel depressed. You have ever been abused or do not feel safe at home. Summary Adopting a healthy lifestyle and getting preventive care are important in promoting health and wellness. Follow your health care provider's instructions about healthy diet, exercising, and getting tested or screened for diseases. Follow your health care provider's instructions on monitoring your cholesterol and blood pressure. This information is not intended to replace advice given to you by your health care provider. Make sure you discuss any questions you have with your healthcare provider. Document Revised: 01/08/2018 Document Reviewed: 01/08/2018 Elsevier Patient Education  2022 Elsevier Inc.  

## 2020-08-25 ENCOUNTER — Other Ambulatory Visit: Payer: Self-pay

## 2020-08-25 ENCOUNTER — Ambulatory Visit (INDEPENDENT_AMBULATORY_CARE_PROVIDER_SITE_OTHER): Payer: Medicare Other

## 2020-08-25 ENCOUNTER — Telehealth: Payer: Self-pay

## 2020-08-25 DIAGNOSIS — E781 Pure hyperglyceridemia: Secondary | ICD-10-CM | POA: Diagnosis not present

## 2020-08-25 DIAGNOSIS — Z1159 Encounter for screening for other viral diseases: Secondary | ICD-10-CM | POA: Diagnosis not present

## 2020-08-25 DIAGNOSIS — K760 Fatty (change of) liver, not elsewhere classified: Secondary | ICD-10-CM

## 2020-08-25 DIAGNOSIS — I10 Essential (primary) hypertension: Secondary | ICD-10-CM

## 2020-08-25 LAB — COMPREHENSIVE METABOLIC PANEL
ALT: 42 U/L (ref 0–53)
AST: 41 U/L — ABNORMAL HIGH (ref 0–37)
Albumin: 4.8 g/dL (ref 3.5–5.2)
Alkaline Phosphatase: 54 U/L (ref 39–117)
BUN: 16 mg/dL (ref 6–23)
CO2: 23 mEq/L (ref 19–32)
Calcium: 9.9 mg/dL (ref 8.4–10.5)
Chloride: 99 mEq/L (ref 96–112)
Creatinine, Ser: 0.96 mg/dL (ref 0.40–1.50)
GFR: 98.11 mL/min (ref >60.00)
Glucose, Bld: 158 mg/dL — ABNORMAL HIGH (ref 70–99)
Potassium: 4.1 mEq/L (ref 3.5–5.1)
Sodium: 134 mEq/L — ABNORMAL LOW (ref 135–145)
Total Bilirubin: 0.4 mg/dL (ref 0.2–1.2)
Total Protein: 7.5 g/dL (ref 6.0–8.3)

## 2020-08-25 LAB — LIPID PANEL
Cholesterol: 207 mg/dL — ABNORMAL HIGH (ref 0–200)
HDL: 31.9 mg/dL — ABNORMAL LOW (ref >39.00)
Total CHOL/HDL Ratio: 6
Triglycerides: 1103 mg/dL — ABNORMAL HIGH (ref 0.0–149.0)

## 2020-08-25 LAB — LDL CHOLESTEROL, DIRECT: Direct LDL: 80 mg/dL

## 2020-08-25 NOTE — Telephone Encounter (Signed)
Please advise if this is okay or would have to follow with DOT

## 2020-08-25 NOTE — Telephone Encounter (Signed)
Spoke with pt who will states that the class is asking for a letter from PCP. Pt will fax form or letter to our office

## 2020-08-25 NOTE — Progress Notes (Signed)
Peer the orders of Dr. Milinda Cave  pt is here for labs pt tolerated draw well.

## 2020-08-25 NOTE — Telephone Encounter (Signed)
Patient was here earlier in the week for his complete physical with Dr. Milinda Cave, here today for labs.  He is requesting Dr. 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 Milinda Cave.  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

## 2020-08-25 NOTE — Telephone Encounter (Signed)
DOT physical. 

## 2020-08-26 ENCOUNTER — Other Ambulatory Visit: Payer: Self-pay | Admitting: Family Medicine

## 2020-08-26 DIAGNOSIS — R739 Hyperglycemia, unspecified: Secondary | ICD-10-CM

## 2020-08-26 DIAGNOSIS — E781 Pure hyperglyceridemia: Secondary | ICD-10-CM

## 2020-08-26 LAB — APOLIPOPROTEIN B: Apolipoprotein B: 122 mg/dL — ABNORMAL HIGH (ref <90)

## 2020-08-26 LAB — HEPATITIS C ANTIBODY
Hepatitis C Ab: NONREACTIVE
SIGNAL TO CUT-OFF: 0.01 (ref <1.00)

## 2020-08-29 DIAGNOSIS — H6983 Other specified disorders of Eustachian tube, bilateral: Secondary | ICD-10-CM | POA: Diagnosis not present

## 2020-08-29 DIAGNOSIS — H66012 Acute suppurative otitis media with spontaneous rupture of ear drum, left ear: Secondary | ICD-10-CM | POA: Diagnosis not present

## 2020-08-29 DIAGNOSIS — H906 Mixed conductive and sensorineural hearing loss, bilateral: Secondary | ICD-10-CM | POA: Diagnosis not present

## 2020-08-29 DIAGNOSIS — H838X3 Other specified diseases of inner ear, bilateral: Secondary | ICD-10-CM | POA: Diagnosis not present

## 2020-08-29 DIAGNOSIS — E119 Type 2 diabetes mellitus without complications: Secondary | ICD-10-CM

## 2020-08-29 HISTORY — DX: Type 2 diabetes mellitus without complications: E11.9

## 2020-08-30 ENCOUNTER — Ambulatory Visit (INDEPENDENT_AMBULATORY_CARE_PROVIDER_SITE_OTHER): Payer: Medicare Other

## 2020-08-30 ENCOUNTER — Other Ambulatory Visit: Payer: Self-pay

## 2020-08-30 DIAGNOSIS — E781 Pure hyperglyceridemia: Secondary | ICD-10-CM | POA: Diagnosis not present

## 2020-08-30 DIAGNOSIS — R739 Hyperglycemia, unspecified: Secondary | ICD-10-CM | POA: Diagnosis not present

## 2020-08-30 LAB — COMPREHENSIVE METABOLIC PANEL
ALT: 33 U/L (ref 0–53)
AST: 35 U/L (ref 0–37)
Albumin: 4.7 g/dL (ref 3.5–5.2)
Alkaline Phosphatase: 56 U/L (ref 39–117)
BUN: 19 mg/dL (ref 6–23)
CO2: 25 mEq/L (ref 19–32)
Calcium: 10 mg/dL (ref 8.4–10.5)
Chloride: 100 mEq/L (ref 96–112)
Creatinine, Ser: 0.8 mg/dL (ref 0.40–1.50)
GFR: 109.84 mL/min (ref >60.00)
Glucose, Bld: 130 mg/dL — ABNORMAL HIGH (ref 70–99)
Potassium: 4.1 mEq/L (ref 3.5–5.1)
Sodium: 136 mEq/L (ref 135–145)
Total Bilirubin: 0.5 mg/dL (ref 0.2–1.2)
Total Protein: 7.6 g/dL (ref 6.0–8.3)

## 2020-08-30 LAB — HEMOGLOBIN A1C: Hgb A1c MFr Bld: 7.6 % — ABNORMAL HIGH (ref 4.6–6.5)

## 2020-08-30 LAB — LIPID PANEL
Cholesterol: 211 mg/dL — ABNORMAL HIGH (ref 0–200)
HDL: 31.5 mg/dL — ABNORMAL LOW (ref >39.00)
Total CHOL/HDL Ratio: 7
Triglycerides: 825 mg/dL — ABNORMAL HIGH (ref 0.0–149.0)

## 2020-08-30 LAB — LDL CHOLESTEROL, DIRECT: Direct LDL: 73 mg/dL

## 2020-08-31 ENCOUNTER — Encounter: Payer: Self-pay | Admitting: Family Medicine

## 2020-08-31 ENCOUNTER — Other Ambulatory Visit: Payer: Self-pay

## 2020-08-31 MED ORDER — PIOGLITAZONE HCL 15 MG PO TABS: 15.0000 mg | ORAL_TABLET | Freq: Every day | ORAL | 3 refills | Status: DC

## 2020-09-02 DIAGNOSIS — Z23 Encounter for immunization: Secondary | ICD-10-CM | POA: Diagnosis not present

## 2020-09-04 ENCOUNTER — Other Ambulatory Visit: Payer: Self-pay

## 2020-09-04 ENCOUNTER — Emergency Department (HOSPITAL_COMMUNITY)
Admission: EM | Admit: 2020-09-04 | Discharge: 2020-09-04 | Disposition: A | Discharge status: Home / Self Care | Payer: Medicare Other | Attending: Emergency Medicine | Admitting: Emergency Medicine

## 2020-09-04 DIAGNOSIS — Z91040 Latex allergy status: Secondary | ICD-10-CM | POA: Insufficient documentation

## 2020-09-04 DIAGNOSIS — E119 Type 2 diabetes mellitus without complications: Secondary | ICD-10-CM | POA: Insufficient documentation

## 2020-09-04 DIAGNOSIS — I10 Essential (primary) hypertension: Secondary | ICD-10-CM | POA: Insufficient documentation

## 2020-09-04 DIAGNOSIS — Z79899 Other long term (current) drug therapy: Secondary | ICD-10-CM | POA: Diagnosis not present

## 2020-09-04 DIAGNOSIS — S0083XA Contusion of other part of head, initial encounter: Secondary | ICD-10-CM

## 2020-09-04 DIAGNOSIS — Z7984 Long term (current) use of oral hypoglycemic drugs: Secondary | ICD-10-CM | POA: Diagnosis not present

## 2020-09-04 DIAGNOSIS — X58XXXA Exposure to other specified factors, initial encounter: Secondary | ICD-10-CM | POA: Insufficient documentation

## 2020-09-04 DIAGNOSIS — S0011XA Contusion of right eyelid and periocular area, initial encounter: Secondary | ICD-10-CM | POA: Diagnosis not present

## 2020-09-04 DIAGNOSIS — S0993XA Unspecified injury of face, initial encounter: Secondary | ICD-10-CM | POA: Diagnosis present

## 2020-09-04 DIAGNOSIS — Z23 Encounter for immunization: Secondary | ICD-10-CM | POA: Diagnosis not present

## 2020-09-04 DIAGNOSIS — Z87891 Personal history of nicotine dependence: Secondary | ICD-10-CM | POA: Diagnosis not present

## 2020-09-04 MED ORDER — TETANUS-DIPHTH-ACELL PERTUSSIS 5-2.5-18.5 LF-MCG/0.5 IM SUSY
0.5000 mL | PREFILLED_SYRINGE | Freq: Once | INTRAMUSCULAR | Status: AC
  Administered 2020-09-04: 0.5 mL via INTRAMUSCULAR
  Filled 2020-09-04: qty 0.5

## 2020-09-04 NOTE — ED Provider Notes (Signed)
[facility] EMERGENCY DEPARTMENT Provider Note   CSN: [id number] Arrival date & time: 09/04/20  1426     History No chief complaint on file.   Bobby Jimenez is a 40 y.o. male.  Patient states he was weed eating the other day and after he got through he noticed some swelling below his right eye.  Patient has no visual problem    Facial Injury Mechanism of injury:  Unable to specify Location:  Face Pain details:    Quality:  Aching   Severity:  Mild   Timing:  Constant   Progression:  Waxing and waning Foreign body present:  No foreign bodies Relieved by:  Nothing Worsened by:  Nothing Associated symptoms: no altered mental status, no congestion and no headaches   Risk factors: no alcohol use       Past Medical History:  Diagnosis Date   ADHD (attention deficit hyperactivity disorder)    Anxiety    Bilateral carpal tunnel syndrome    transient relief from inj by ortho 04/2020; NCS confirmed bilat CTS->surgery planned as of 05/2020 ortho f/u   Bipolar disorder (HCC)    Cervical radiculopathy    Diabetes mellitus (HCC) 08/2020   Aug 2022->fasting gluc 130, Hba1c 7.6%   Dizziness    lightheaded   Food allergy    pineapple   GERD (gastroesophageal reflux disease)    Headache syndrome    Hepatic steatosis    u/s and CT confirmed 2020--no cirrhosis.   Hypertension    Hypertriglyceridemia    Very high (>800 while on vascepa and rosuva 06/2019)->rosuva and vascepa d/c'd and gemfibrozil started, also referred pt to advanced lipid clinic   IBS (irritable bowel syndrome)    D.  bentyl helping ([facility])   Latex allergy    Low back pain    Mixed hyperlipidemia    OSA on CPAP 10/16/2016   Tremors of nervous system    arms, dx'd as lasting side effect from lithium use in remote past: psych has him on amantadine for this.   Vertigo     Patient Active Problem List   Diagnosis Date Noted   IBS (irritable bowel syndrome) 05/05/2019   Obesity (BMI 30-39.9)  01/07/2019   Abdominal pain 12/30/2018   Fatty liver disease, nonalcoholic 12/30/2018   Sinus tachycardia 12/27/2016   Atelectasis 12/27/2016   Empyema of gallbladder    Sepsis due to undetermined organism (HCC) 12/23/2016   Hyponatremia 12/23/2016   Hypokalemia 12/23/2016   Calculus of gallbladder with acute cholecystitis without obstruction    GERD (gastroesophageal reflux disease) 09/15/2014   Nausea with vomiting 09/15/2014   Diarrhea 09/15/2014   Left sided abdominal pain 09/15/2014   Alteration consciousness 12/23/2013   Hypertension    Anxiety    Dizziness    Lightheaded     Past Surgical History:  Procedure Laterality Date   CARPAL TUNNEL RELEASE Left    L 05/2020.  Plan for R side to be done.   CARPAL TUNNEL RELEASE Right    07/2020   CHOLECYSTECTOMY N/A 12/26/2016   Procedure: LAPAROSCOPIC CHOLECYSTECTOMY;  Surgeon: Franky Macho, MD;  Location: [facility];  Service: General;  Laterality: N/A;   KNEE SURGERY Right 1997   arthroscopic   polysomnogram  09/2016   Mild/mod OSA->CPAP (Dr. Lestine Mount)   WISDOM TOOTH EXTRACTION  1997       Family History  Adopted: Yes  Problem Relation Age of Onset   High blood pressure Mother    Seizures  Mother    Colon cancer Neg Hx     Social History   Tobacco Use   Smoking status: Former    Packs/day: 1.00    Years: 10.00    Pack years: 10.00    Types: Cigarettes   Smokeless tobacco: Current    Types: Snuff   Tobacco comments:    quit Nov 2018  Vaping Use   Vaping Use: Never used  Substance Use Topics   Alcohol use: Yes    Alcohol/week: 1.0 standard drink    Types: 1 Cans of beer per week    Comment: heavy in past, short period of time in 20s.; 05/24/20 occ   Drug use: No    Comment: Quit 1996    Home Medications Prior to Admission medications   Medication Sig Start Date End Date Taking? Authorizing Provider  ABILIFY 10 MG tablet Take 5 mg by mouth 2 (two) times daily.  08/11/14   [provider]   amantadine (SYMMETREL) 100 MG capsule Take 100 mg by mouth 2 (two) times daily.     [provider]  clonazePAM (KLONOPIN) 0.5 MG tablet Take 0.25-0.5 mg by mouth 2 (two) times daily. Takes 1/2 tablet in am and 1 tablet at bedtime    [provider]  dicyclomine (BENTYL) 10 MG capsule TAKE 1 CAPSULE BY MOUTH 4 TIMES DAILY WITH MEALS AND AT BEDTIME AS NEEDED. HOLD FOR CONSTIPATION. 05/24/20   Letta Median, PA-C  fexofenadine (ALLEGRA) 180 MG tablet Take 1 tablet (180 mg total) by mouth daily. 07/09/19 07/08/20  [name], [name], MD  fluticasone (FLONASE) 50 MCG/ACT nasal spray INSTILL 2 SPRAYS INTO EACH NOSTRIL DAILY. 08/22/20   [name], [name], MD  gemfibrozil (LOPID) 600 MG tablet TAKE 1 TABLET BY MOUTH TWICE DAILY BEFORE A MEAL. 03/30/20   Hilty, Lisette Abu, MD  icosapent Ethyl (VASCEPA) 1 g capsule Take 2 capsules (2 g total) by mouth 2 (two) times daily. 12/28/19   Hilty, Lisette Abu, MD  lisinopril (ZESTRIL) 10 MG tablet TAKE 1 TABLET BY MOUTH ONCE A DAY. 01/09/20   [name], [name], MD  metoprolol tartrate (LOPRESSOR) 25 MG tablet TAKE (1) TABLET BY MOUTH TWICE DAILY. 02/22/20   [name], [name], MD  Multiple Vitamin (MULTIVITAMIN) tablet Take 1 tablet by mouth daily.    [provider]  pantoprazole (PROTONIX) 40 MG tablet TAKE 1 TABLET BY MOUTH ONCE DAILY BEFORE BREAKFAST. 06/10/19   Tiffany Kocher, PA-C  pioglitazone (ACTOS) 15 MG tablet Take 1 tablet (15 mg total) by mouth daily. 08/31/20   [name], [name], MD  ZOLOFT 100 MG tablet Take 200 mg by mouth daily. 08/11/14   [provider]    Allergies    Pineapple, Latex, Morphine and related, and Statins  Review of Systems   Review of Systems  Constitutional:  Negative for appetite change and fatigue.  HENT:  Negative for congestion, ear discharge and sinus pressure.        Bruising to his anterior right eye  Eyes:  Negative for discharge.  Respiratory:  Negative for cough.   Cardiovascular:   Negative for chest pain.  Gastrointestinal:  Negative for abdominal pain and diarrhea.  Genitourinary:  Negative for frequency and hematuria.  Musculoskeletal:  Negative for back pain.  Skin:  Negative for rash.  Neurological:  Negative for seizures and headaches.  Psychiatric/Behavioral:  Negative for hallucinations.    Physical Exam Updated Vital Signs BP 126/83   Pulse 74  Temp 97.9 F (36.6 C) (Oral)   Resp 16   Ht 5\' 10"  (1.778 m)   Wt 118.8 kg   SpO2 98%   BMI 37.59 kg/m   Physical Exam Vitals and nursing note reviewed.  Constitutional:      Appearance: He is well-developed.  HENT:     Head: Normocephalic.     Comments: Mild bruising to his lower lid of his right no obvious infection    Nose: Nose normal.  Eyes:     General: No scleral icterus.    Conjunctiva/sclera: Conjunctivae normal.  Neck:     Thyroid: No thyromegaly.  Cardiovascular:     Rate and Rhythm: Normal rate and regular rhythm.     Heart sounds: No murmur heard.   No friction rub. No gallop.  Pulmonary:     Breath sounds: No stridor. No wheezing or rales.  Chest:     Chest wall: No tenderness.  Abdominal:     General: There is no distension.     Tenderness: There is no abdominal tenderness. There is no rebound.  Musculoskeletal:        General: Normal range of motion.     Cervical back: Neck supple.  Lymphadenopathy:     Cervical: No cervical adenopathy.  Skin:    Findings: No erythema or rash.  Neurological:     Mental Status: He is oriented to person, place, and time.     Motor: No abnormal muscle tone.     Coordination: Coordination normal.  Psychiatric:        Behavior: Behavior normal.    ED Results / Procedures / Treatments   Labs (all labs ordered are listed, but only abnormal results are displayed) Labs Reviewed - No data to display  EKG None  Radiology No results found.  Procedures Procedures   Medications Ordered in ED Medications  Tdap (BOOSTRIX) injection 0.5  mL (has no administration in time range)    ED Course  I have reviewed the triage vital signs and the nursing notes.  Pertinent labs & imaging results that were available during my care of the patient were reviewed by me and considered in my medical decision making (see chart for details).    MDM Rules/Calculators/A&P                           Bruising to the lower lid of right eye.  Most likely caused from trauma.  Patient will use ice on his bruise and take Tylenol Motrin follow-up with his PCP in a couple days if not improving Final Clinical Impression(s) / ED Diagnoses Final diagnoses:  Contusion of face, initial encounter    Rx / DC Orders ED Discharge Orders     None        , MD 09/04/20 1544

## 2020-09-04 NOTE — Discharge Instructions (Addendum)
Use ice over the bruise for couple days.  Take Tylenol or Motrin for pain.  Follow-up with your doctor if not improving

## 2020-09-04 NOTE — ED Triage Notes (Signed)
Pt reports right eye swelling of unknown cause. Eye is bruised. Pt reports no apparent injury. Started yesterday afternoon after weedeating and cleaning out basement. Ice applied to area with no relief of symptoms.

## 2020-09-05 ENCOUNTER — Other Ambulatory Visit: Payer: Self-pay | Admitting: Gastroenterology

## 2020-09-07 DIAGNOSIS — F902 Attention-deficit hyperactivity disorder, combined type: Secondary | ICD-10-CM | POA: Diagnosis not present

## 2020-09-07 DIAGNOSIS — F3132 Bipolar disorder, current episode depressed, moderate: Secondary | ICD-10-CM | POA: Diagnosis not present

## 2020-09-07 DIAGNOSIS — F4011 Social phobia, generalized: Secondary | ICD-10-CM | POA: Diagnosis not present

## 2020-09-09 ENCOUNTER — Other Ambulatory Visit: Payer: Self-pay | Admitting: Family Medicine

## 2020-09-28 DIAGNOSIS — H838X3 Other specified diseases of inner ear, bilateral: Secondary | ICD-10-CM | POA: Diagnosis not present

## 2020-09-28 DIAGNOSIS — H7202 Central perforation of tympanic membrane, left ear: Secondary | ICD-10-CM | POA: Diagnosis not present

## 2020-09-28 DIAGNOSIS — H906 Mixed conductive and sensorineural hearing loss, bilateral: Secondary | ICD-10-CM | POA: Diagnosis not present

## 2020-10-25 ENCOUNTER — Other Ambulatory Visit: Payer: Self-pay | Admitting: Gastroenterology

## 2020-11-10 DIAGNOSIS — G5601 Carpal tunnel syndrome, right upper limb: Secondary | ICD-10-CM | POA: Diagnosis not present

## 2020-11-10 DIAGNOSIS — G5602 Carpal tunnel syndrome, left upper limb: Secondary | ICD-10-CM | POA: Diagnosis not present

## 2020-11-26 ENCOUNTER — Other Ambulatory Visit: Payer: Self-pay | Admitting: Family Medicine

## 2020-12-01 ENCOUNTER — Encounter: Payer: Self-pay | Admitting: Family Medicine

## 2020-12-01 ENCOUNTER — Other Ambulatory Visit: Payer: Self-pay

## 2020-12-01 ENCOUNTER — Ambulatory Visit (INDEPENDENT_AMBULATORY_CARE_PROVIDER_SITE_OTHER): Payer: Medicare Other | Admitting: Family Medicine

## 2020-12-01 VITALS — BP 118/71 | HR 75 | Temp 97.8°F | Ht 70.0 in | Wt 274.8 lb

## 2020-12-01 DIAGNOSIS — I10 Essential (primary) hypertension: Secondary | ICD-10-CM | POA: Diagnosis not present

## 2020-12-01 DIAGNOSIS — E782 Mixed hyperlipidemia: Secondary | ICD-10-CM

## 2020-12-01 DIAGNOSIS — E119 Type 2 diabetes mellitus without complications: Secondary | ICD-10-CM | POA: Diagnosis not present

## 2020-12-01 DIAGNOSIS — Z23 Encounter for immunization: Secondary | ICD-10-CM | POA: Diagnosis not present

## 2020-12-01 LAB — COMPREHENSIVE METABOLIC PANEL
ALT: 53 U/L (ref 0–53)
AST: 34 U/L (ref 0–37)
Albumin: 5 g/dL (ref 3.5–5.2)
Alkaline Phosphatase: 48 U/L (ref 39–117)
BUN: 20 mg/dL (ref 6–23)
CO2: 26 mEq/L (ref 19–32)
Calcium: 9.9 mg/dL (ref 8.4–10.5)
Chloride: 101 mEq/L (ref 96–112)
Creatinine, Ser: 0.82 mg/dL (ref 0.40–1.50)
GFR: 108.83 mL/min (ref >60.00)
Glucose, Bld: 99 mg/dL (ref 70–99)
Potassium: 4.2 mEq/L (ref 3.5–5.1)
Sodium: 137 mEq/L (ref 135–145)
Total Bilirubin: 0.5 mg/dL (ref 0.2–1.2)
Total Protein: 7.6 g/dL (ref 6.0–8.3)

## 2020-12-01 LAB — CBC WITH DIFFERENTIAL/PLATELET
Basophils Absolute: 0.1 10*3/uL (ref 0.0–0.1)
Basophils Relative: 0.9 % (ref 0.0–3.0)
Eosinophils Absolute: 0.1 10*3/uL (ref 0.0–0.7)
Eosinophils Relative: 2.2 % (ref 0.0–5.0)
HCT: 38.5 % — ABNORMAL LOW (ref 39.0–52.0)
Hemoglobin: 13.1 g/dL (ref 13.0–17.0)
Lymphocytes Relative: 28.9 % (ref 12.0–46.0)
Lymphs Abs: 1.7 10*3/uL (ref 0.7–4.0)
MCHC: 34.1 g/dL (ref 30.0–36.0)
MCV: 90.8 fl (ref 78.0–100.0)
Monocytes Absolute: 0.4 10*3/uL (ref 0.1–1.0)
Monocytes Relative: 7.8 % (ref 3.0–12.0)
Neutro Abs: 3.4 10*3/uL (ref 1.4–7.7)
Neutrophils Relative %: 60.2 % (ref 43.0–77.0)
Platelets: 224 10*3/uL (ref 150.0–400.0)
RBC: 4.24 Mil/uL (ref 4.22–5.81)
RDW: 14.9 % (ref 11.5–15.5)
WBC: 5.7 10*3/uL (ref 4.0–10.5)

## 2020-12-01 LAB — LIPID PANEL
Cholesterol: 197 mg/dL (ref 0–200)
HDL: 34.2 mg/dL — ABNORMAL LOW (ref >39.00)
Total CHOL/HDL Ratio: 6
Triglycerides: 617 mg/dL — ABNORMAL HIGH (ref 0.0–149.0)

## 2020-12-01 LAB — MICROALBUMIN / CREATININE URINE RATIO
Creatinine,U: 66.1 mg/dL
Microalb Creat Ratio: 1.1 mg/g (ref 0.0–30.0)
Microalb, Ur: <0.7 mg/dL (ref 0.0–1.9)

## 2020-12-01 LAB — HEMOGLOBIN A1C: Hgb A1c MFr Bld: 6.4 % (ref 4.6–6.5)

## 2020-12-01 LAB — TSH: TSH: 3.77 u[IU]/mL (ref 0.35–5.50)

## 2020-12-01 LAB — LDL CHOLESTEROL, DIRECT: Direct LDL: 87 mg/dL

## 2020-12-01 NOTE — Progress Notes (Addendum)
OFFICE VISIT  12/01/2020  CC:  Chief Complaint  Patient presents with   Follow-up    RCI, pt is fasting     HPI:    Patient is a 40 y.o. male who presents for 3 mo f/u HTN, hypertriglyceridemia, and obesity with hepatic steatosis. Overall patient reports he is feeling well and is denying symptoms of hyperglycemia (sweats, anxiousness, extreme thirst, polydipsia, polyuria). Feels like medication is doing what it is supposed to do. Discussed he is working on a better diet and exercise practices. Patient has sustained from tobacco usage for years now. ROS negative (cough, congestion, swelling, abdominal pain, SOB, headache).  INTERIM HX: Labs last visit showed a1c up into diabetic range (7.6%) and fasting glucose 130.  Patient was started on Pioglitazone for glucose control and reports no side effects.  Also, his trigs were still >800 and upon Dr. Blanchie Dessert recommendation I started pt on atorvastatin.  Added gemfibrozil due to allergy to atorvastatin. He has remained on gemfibrozil and vascepa at full doses.  Past Medical History:  Diagnosis Date   ADHD (attention deficit hyperactivity disorder)    Anxiety    Bilateral carpal tunnel syndrome    transient relief from inj by ortho 04/2020; NCS confirmed bilat CTS->surgery planned as of 05/2020 ortho f/u   Bipolar disorder (HCC)    Cervical radiculopathy    Diabetes mellitus (HCC) 08/2020   Aug 2022->fasting gluc 130, Hba1c 7.6%   Dizziness    lightheaded   Food allergy    pineapple   GERD (gastroesophageal reflux disease)    Headache syndrome    Hepatic steatosis    u/s and CT confirmed 2020--no cirrhosis.   Hypertension    Hypertriglyceridemia    Very high (>800 while on vascepa and rosuva 06/2019)->rosuva and vascepa d/c'd and gemfibrozil started, also referred pt to advanced lipid clinic   IBS (irritable bowel syndrome)    D.  bentyl helping ([facility])   Latex allergy    Low back pain    Mixed hyperlipidemia    OSA on  CPAP 10/16/2016   Tremors of nervous system    arms, dx'd as lasting side effect from lithium use in remote past: psych has him on amantadine for this.   Vertigo     Past Surgical History:  Procedure Laterality Date   CARPAL TUNNEL RELEASE Left    L 05/2020.  Plan for R side to be done.   CARPAL TUNNEL RELEASE Right    07/2020   CHOLECYSTECTOMY N/A 12/26/2016   Procedure: LAPAROSCOPIC CHOLECYSTECTOMY;  Surgeon: Franky Macho, MD;  Location: [facility];  Service: General;  Laterality: N/A;   KNEE SURGERY Right 1997   arthroscopic   polysomnogram  09/2016   Mild/mod OSA->CPAP (Dr. Lestine Mount)   WISDOM TOOTH EXTRACTION  1997    Outpatient Medications Prior to Visit  Medication Sig Dispense Refill   ABILIFY 10 MG tablet Take 5 mg by mouth 2 (two) times daily.      amantadine (SYMMETREL) 100 MG capsule Take 100 mg by mouth 2 (two) times daily.      clonazePAM (KLONOPIN) 0.5 MG tablet Take 0.25-0.5 mg by mouth 2 (two) times daily. Takes 1/2 tablet in am and 1 tablet at bedtime     dicyclomine (BENTYL) 10 MG capsule TAKE 1 CAPSULE BY MOUTH 4 TIMES DAILY WITH MEALS AND AT BEDTIME AS NEEDED. HOLD FOR CONSTIPATION. 120 capsule 5   fluticasone (FLONASE) 50 MCG/ACT nasal spray INSTILL 2 SPRAYS INTO EACH NOSTRIL DAILY.  16 g 11   gemfibrozil (LOPID) 600 MG tablet TAKE 1 TABLET BY MOUTH TWICE DAILY BEFORE A MEAL. 60 tablet 10   icosapent Ethyl (VASCEPA) 1 g capsule Take 2 capsules (2 g total) by mouth 2 (two) times daily. 360 capsule 3   lisinopril (ZESTRIL) 10 MG tablet TAKE 1 TABLET BY MOUTH ONCE A DAY. 90 tablet 3   metoprolol tartrate (LOPRESSOR) 25 MG tablet TAKE (1) TABLET BY MOUTH TWICE DAILY. 180 tablet 0   Multiple Vitamin (MULTIVITAMIN) tablet Take 1 tablet by mouth daily.     pantoprazole (PROTONIX) 40 MG tablet TAKE 1 TABLET BY MOUTH ONCE DAILY BEFORE BREAKFAST. 90 tablet 3   pioglitazone (ACTOS) 15 MG tablet TAKE ONE TABLET BY MOUTH ONCE DAILY. 30 tablet 0   ZOLOFT 100 MG tablet Take 200 mg  by mouth daily.     fexofenadine (ALLEGRA) 180 MG tablet Take 1 tablet (180 mg total) by mouth daily. 90 tablet 1   No facility-administered medications prior to visit.    Allergies  Allergen Reactions   Pineapple Shortness Of Breath and Swelling   Latex Swelling   Morphine And Related Hives and Itching   Statins     "locks urinary tract up"    ROS As per HPI  PE: Vitals with BMI 12/01/2020 09/04/2020 08/22/2020  Height 5\' 10"  5\' 10"  5\' 10"   Weight 274 lbs 13 oz 262 lbs 261 lbs 3 oz  BMI 39.43 37.59 37.48  Systolic 118 126  Diastolic 71 83 72  Pulse 75 74 75   Physical Exam Constitutional:      Appearance: Normal appearance.  Cardiovascular:     Rate and Rhythm: Normal rate and regular rhythm.     Heart sounds: Normal heart sounds.  Pulmonary:     Effort: Pulmonary effort is normal.     Breath sounds: Normal breath sounds.  Abdominal:     General: Abdomen is flat.     Palpations: Abdomen is soft.  Neurological:     Mental Status: He is alert.  Psychiatric:        Mood and Affect: Mood normal.     LABS:  Lab Results  Component Value Date   TSH 2.58 01/09/2019   Lab Results  Component Value Date   WBC 5.8 11/05/2019   HGB 13.9 11/05/2019   HCT 39.2 11/05/2019   MCV 89.3 11/05/2019   PLT 249 11/05/2019   Lab Results  Component Value Date   CREATININE 0.80 08/30/2020   BUN 19 08/30/2020   NA 136 08/30/2020   K 4.1 08/30/2020   CL 100 08/30/2020   CO2 25 08/30/2020   Lab Results  Component Value Date   ALT 33 08/30/2020   AST 35 08/30/2020   ALKPHOS 56 08/30/2020   BILITOT 0.5 08/30/2020   Lab Results  Component Value Date   CHOL 211 (H) 08/30/2020   Lab Results  Component Value Date   HDL 31.50 (L) 08/30/2020   Lab Results  Component Value Date   LDLCALC 82 01/18/2020   Lab Results  Component Value Date   TRIG (H) 08/30/2020    825.0 Triglyceride is over 400; calculations on Lipids are invalid.   Lab Results  Component Value Date    CHOLHDL 7 08/30/2020   Lab Results  Component Value Date   HGBA1C 7.6 (H) 08/30/2020   IMPRESSION AND PLAN:  An After Visit Summary was printed and given to the patient.  A/P as of last visit:  1.) DM -- Patient has a past HbA1c of 7.6 that it will be rechecked today along with urine cr/albumin. Prescribed Actos for glucose control and no side effects (fluid retention and edema) were noted. Patient also denies symptoms of poor glucose control (polyuria, polydyspsia, sweats). Will continue Actos and reassess medication changes after HbA1c is completed. If HbA1c is elevated, will increase Actos prescription or add on a Flozin medication. If Cr/albumin is elevated, plan to increase Lisinopril to 20mg  daily. He has optometrist.  2) HTN: well controlled with lopressor 25mg  bid and lisinopril 10 qd -- ordered lytes/cr today   3) HLD: improved at last visit 01/2019 with Dr. . Continuing on vascepa and gemfibrozil. lipid panel, Apo B level, and hepatic panel ordered--forward/CC results to Dr. 03/2019.   4) Hepatic steatosis, obesity class II. He's continuing to try to make better diet choices and plans on starting walking program when the hot weather abates some.  Patient already has an ophthalmologist he sees every year.   5) Health maintenance exam: Reviewed age and gender appropriate health maintenance issues (prudent diet, regular exercise, health risks of tobacco and excessive alcohol, use of seatbelts, fire alarms in home, use of sunscreen).  Also reviewed age and gender appropriate health screening as well as vaccine recommendations.  Vaccines: ALL UTD. Flu -> given, Pneumococcal vaccine 20 -> given  Labs: cmet, HbA1c, direct LDL, lipid panel, TSH, CBC, albumin/cr, flp, apolipoprotein B, hep c screening-->future when fasting.  Last cpe was 07/2020  FOLLOW UP: Return in about 3 months (around 03/03/2021) for routine chronic illness f/u.  08/2020 - MS3  I personally was  present during the history, physical exam, and medical decision-making activities of this service and have verified that the service and findings are accurately documented in the student's note.  Signed:  05/01/2021, MD           12/01/2020

## 2020-12-01 NOTE — Progress Notes (Signed)
A user error has taken place: encounter opened in error, closed for administrative reasons.

## 2020-12-01 NOTE — Progress Notes (Addendum)
See student note for this encounter. Signed:  Santiago Bumpers, MD           12/01/2020

## 2020-12-02 NOTE — Progress Notes (Signed)
Urine microalbumin/cr NEGATIVE.  This is great.

## 2020-12-02 NOTE — Progress Notes (Signed)
Pls notify pt that Dr. Rennis Golden said he would not do anything new except advise pt to eat a very low fat diet (<15% of daily calories). Continue all current medications.

## 2020-12-06 DIAGNOSIS — F4011 Social phobia, generalized: Secondary | ICD-10-CM | POA: Diagnosis not present

## 2020-12-06 DIAGNOSIS — F902 Attention-deficit hyperactivity disorder, combined type: Secondary | ICD-10-CM | POA: Diagnosis not present

## 2020-12-06 DIAGNOSIS — F3132 Bipolar disorder, current episode depressed, moderate: Secondary | ICD-10-CM | POA: Diagnosis not present

## 2020-12-10 ENCOUNTER — Other Ambulatory Visit: Payer: Self-pay | Admitting: Family Medicine

## 2020-12-12 ENCOUNTER — Other Ambulatory Visit: Payer: Self-pay | Admitting: Family Medicine

## 2020-12-12 NOTE — Telephone Encounter (Signed)
Pt called stating he is going out of town 7a 11/15 & needing meds to be sent in today if possible.  [facility] - [place], [place] - [address]

## 2020-12-14 DIAGNOSIS — Z23 Encounter for immunization: Secondary | ICD-10-CM | POA: Diagnosis not present

## 2020-12-27 DIAGNOSIS — E119 Type 2 diabetes mellitus without complications: Secondary | ICD-10-CM | POA: Diagnosis not present

## 2020-12-27 LAB — HM DIABETES EYE EXAM

## 2021-01-10 ENCOUNTER — Other Ambulatory Visit: Payer: Self-pay | Admitting: Family Medicine

## 2021-01-25 ENCOUNTER — Encounter (HOSPITAL_COMMUNITY): Payer: Self-pay | Admitting: Emergency Medicine

## 2021-01-25 ENCOUNTER — Other Ambulatory Visit: Payer: Self-pay

## 2021-01-25 DIAGNOSIS — R000 Tachycardia, unspecified: Secondary | ICD-10-CM | POA: Diagnosis not present

## 2021-01-25 DIAGNOSIS — Z91040 Latex allergy status: Secondary | ICD-10-CM | POA: Insufficient documentation

## 2021-01-25 DIAGNOSIS — R109 Unspecified abdominal pain: Secondary | ICD-10-CM | POA: Diagnosis not present

## 2021-01-25 DIAGNOSIS — Z87891 Personal history of nicotine dependence: Secondary | ICD-10-CM | POA: Diagnosis not present

## 2021-01-25 DIAGNOSIS — Z79899 Other long term (current) drug therapy: Secondary | ICD-10-CM | POA: Diagnosis not present

## 2021-01-25 DIAGNOSIS — E119 Type 2 diabetes mellitus without complications: Secondary | ICD-10-CM | POA: Insufficient documentation

## 2021-01-25 DIAGNOSIS — R1012 Left upper quadrant pain: Secondary | ICD-10-CM | POA: Diagnosis not present

## 2021-01-25 DIAGNOSIS — R1032 Left lower quadrant pain: Secondary | ICD-10-CM | POA: Diagnosis not present

## 2021-01-25 DIAGNOSIS — I10 Essential (primary) hypertension: Secondary | ICD-10-CM | POA: Diagnosis not present

## 2021-01-25 NOTE — ED Triage Notes (Signed)
Pt c/o left upper abd pain x 2 days.

## 2021-01-26 ENCOUNTER — Emergency Department (HOSPITAL_COMMUNITY)
Admission: EM | Admit: 2021-01-26 | Discharge: 2021-01-26 | Disposition: A | Discharge status: Home / Self Care | Payer: Medicare Other | Attending: Emergency Medicine | Admitting: Emergency Medicine

## 2021-01-26 ENCOUNTER — Emergency Department (HOSPITAL_COMMUNITY): Payer: Medicare Other

## 2021-01-26 DIAGNOSIS — R109 Unspecified abdominal pain: Secondary | ICD-10-CM | POA: Diagnosis not present

## 2021-01-26 DIAGNOSIS — R1032 Left lower quadrant pain: Secondary | ICD-10-CM

## 2021-01-26 LAB — URINALYSIS, ROUTINE W REFLEX MICROSCOPIC
Bilirubin Urine: NEGATIVE
Glucose, UA: NEGATIVE mg/dL
Hgb urine dipstick: NEGATIVE
Ketones, ur: NEGATIVE mg/dL
Leukocytes,Ua: NEGATIVE
Nitrite: NEGATIVE
Protein, ur: NEGATIVE mg/dL
Specific Gravity, Urine: 1.026 (ref 1.005–1.030)
pH: 5 (ref 5.0–8.0)

## 2021-01-26 LAB — CBC
HCT: 36.3 % — ABNORMAL LOW (ref 39.0–52.0)
Hemoglobin: 13 g/dL (ref 13.0–17.0)
MCH: 33 pg (ref 26.0–34.0)
MCHC: 35.8 g/dL (ref 30.0–36.0)
MCV: 92.1 fL (ref 80.0–100.0)
Platelets: 235 10*3/uL (ref 150–400)
RBC: 3.94 MIL/uL — ABNORMAL LOW (ref 4.22–5.81)
RDW: 13.9 % (ref 11.5–15.5)
WBC: 6.8 10*3/uL (ref 4.0–10.5)
nRBC: 0 % (ref 0.0–0.2)

## 2021-01-26 LAB — COMPREHENSIVE METABOLIC PANEL
ALT: 33 U/L (ref 0–44)
AST: 26 U/L (ref 15–41)
Albumin: 4.5 g/dL (ref 3.5–5.0)
Alkaline Phosphatase: 45 U/L (ref 38–126)
Anion gap: 9 (ref 5–15)
BUN: 21 mg/dL — ABNORMAL HIGH (ref 6–20)
CO2: 23 mmol/L (ref 22–32)
Calcium: 9.1 mg/dL (ref 8.9–10.3)
Chloride: 105 mmol/L (ref 98–111)
Creatinine, Ser: 1.01 mg/dL (ref 0.61–1.24)
GFR, Estimated: >60 mL/min (ref >60)
Glucose, Bld: 182 mg/dL — ABNORMAL HIGH (ref 70–99)
Potassium: 3.7 mmol/L (ref 3.5–5.1)
Sodium: 137 mmol/L (ref 135–145)
Total Bilirubin: 0.1 mg/dL — ABNORMAL LOW (ref 0.3–1.2)
Total Protein: 7.5 g/dL (ref 6.5–8.1)

## 2021-01-26 LAB — LIPASE, BLOOD: Lipase: 51 U/L (ref 11–51)

## 2021-01-26 MED ORDER — FENTANYL CITRATE PF 50 MCG/ML IJ SOSY
100.0000 ug | PREFILLED_SYRINGE | Freq: Once | INTRAMUSCULAR | Status: AC
  Administered 2021-01-26: 02:00:00 100 ug via INTRAVENOUS
  Filled 2021-01-26: qty 2

## 2021-01-26 MED ORDER — IOHEXOL 300 MG/ML  SOLN
100.0000 mL | Freq: Once | INTRAMUSCULAR | Status: AC | PRN
  Administered 2021-01-26: 02:00:00 100 mL via INTRAVENOUS

## 2021-01-26 NOTE — Discharge Instructions (Signed)

## 2021-01-26 NOTE — ED Provider Notes (Signed)
[facility] EMERGENCY DEPARTMENT Provider Note   CSN: [id number] Arrival date & time: 01/25/21  2240     History Chief Complaint  Patient presents with   Abdominal Pain    Bobby Jimenez is a 40 y.o. male.  The history is provided by the patient.  Abdominal Pain Pain location:  LUQ and LLQ Pain quality: aching and cramping   Pain severity:  Moderate Onset quality:  Gradual Duration:  2 days Timing:  Constant Progression:  Worsening Chronicity:  New Relieved by:  Nothing Worsened by:  Movement and palpation Associated symptoms: no chest pain, no diarrhea, no dysuria, no fever, no hematemesis, no hematochezia and no vomiting   Risk factors: obesity   Patient presents with left upper quadrant left lower quadrant pain for the past 2 days.  He has never had this before.  No fevers or vomiting.  No change in bowel movement    Past Medical History:  Diagnosis Date   ADHD (attention deficit hyperactivity disorder)    Anxiety    Bilateral carpal tunnel syndrome    transient relief from inj by ortho 04/2020; NCS confirmed bilat CTS->surgery planned as of 05/2020 ortho f/u   Bipolar disorder (HCC)    Cervical radiculopathy    Diabetes mellitus (HCC) 08/2020   Aug 2022->fasting gluc 130, Hba1c 7.6%   Dizziness    lightheaded   Food allergy    pineapple   GERD (gastroesophageal reflux disease)    Headache syndrome    Hepatic steatosis    u/s and CT confirmed 2020--no cirrhosis.   Hypertension    Hypertriglyceridemia    Very high (>800 while on vascepa and rosuva 06/2019)->rosuva and vascepa d/c'd and gemfibrozil started, also referred pt to advanced lipid clinic   IBS (irritable bowel syndrome)    D.  bentyl helping ([facility])   Latex allergy    Low back pain    Mixed hyperlipidemia    OSA on CPAP 10/16/2016   Tremors of nervous system    arms, dx'd as lasting side effect from lithium use in remote past: psych has him on amantadine for this.   Vertigo      Patient Active Problem List   Diagnosis Date Noted   IBS (irritable bowel syndrome) 05/05/2019   Obesity (BMI 30-39.9) 01/07/2019   Abdominal pain 12/30/2018   Fatty liver disease, nonalcoholic 12/30/2018   Sinus tachycardia 12/27/2016   Atelectasis 12/27/2016   Empyema of gallbladder    Sepsis due to undetermined organism (HCC) 12/23/2016   Hyponatremia 12/23/2016   Hypokalemia 12/23/2016   Calculus of gallbladder with acute cholecystitis without obstruction    GERD (gastroesophageal reflux disease) 09/15/2014   Nausea with vomiting 09/15/2014   Diarrhea 09/15/2014   Left sided abdominal pain 09/15/2014   Alteration consciousness 12/23/2013   Hypertension    Anxiety    Dizziness    Lightheaded     Past Surgical History:  Procedure Laterality Date   CARPAL TUNNEL RELEASE Left    L 05/2020.  Plan for R side to be done.   CARPAL TUNNEL RELEASE Right    07/2020   CHOLECYSTECTOMY N/A 12/26/2016   Procedure: LAPAROSCOPIC CHOLECYSTECTOMY;  Surgeon: Franky Macho, MD;  Location: [facility];  Service: General;  Laterality: N/A;   KNEE SURGERY Right 1997   arthroscopic   polysomnogram  09/2016   Mild/mod OSA->CPAP (Dr. Lestine Mount)   WISDOM TOOTH EXTRACTION  1997       Family History  Adopted: Yes  Problem  Relation Age of Onset   High blood pressure Mother    Seizures Mother    Colon cancer Neg Hx     Social History   Tobacco Use   Smoking status: Former    Packs/day: 1.00    Years: 10.00    Pack years: 10.00    Types: Cigarettes   Smokeless tobacco: Current    Types: Snuff   Tobacco comments:    quit Nov 2018  Vaping Use   Vaping Use: Never used  Substance Use Topics   Alcohol use: Yes    Alcohol/week: 1.0 standard drink    Types: 1 Cans of beer per week    Comment: heavy in past, short period of time in 20s.; 05/24/20 occ   Drug use: No    Comment: Quit 1996    Home Medications Prior to Admission medications   Medication Sig Start Date End Date Taking?  Authorizing Provider  ABILIFY 10 MG tablet Take 5 mg by mouth 2 (two) times daily.  08/11/14   [provider]  amantadine (SYMMETREL) 100 MG capsule Take 100 mg by mouth 2 (two) times daily.     [provider]  clonazePAM (KLONOPIN) 0.5 MG tablet Take 0.25-0.5 mg by mouth 2 (two) times daily. Takes 1/2 tablet in am and 1 tablet at bedtime    [provider]  dicyclomine (BENTYL) 10 MG capsule TAKE 1 CAPSULE BY MOUTH 4 TIMES DAILY WITH MEALS AND AT BEDTIME AS NEEDED. HOLD FOR CONSTIPATION. 10/26/20   Annitta Needs, NP  fexofenadine (ALLEGRA) 180 MG tablet Take 1 tablet (180 mg total) by mouth daily. 07/09/19 07/08/20  [name], Adrian Blackwater, MD  fluticasone (FLONASE) 50 MCG/ACT nasal spray INSTILL 2 SPRAYS INTO EACH NOSTRIL DAILY. 08/22/20   [name], Adrian Blackwater, MD  gemfibrozil (LOPID) 600 MG tablet TAKE 1 TABLET BY MOUTH TWICE DAILY BEFORE A MEAL. 03/30/20   Hilty, Nadean Corwin, MD  icosapent Ethyl (VASCEPA) 1 g capsule Take 2 capsules (2 g total) by mouth 2 (two) times daily. 12/28/19   Hilty, Nadean Corwin, MD  lisinopril (ZESTRIL) 10 MG tablet TAKE 1 TABLET BY MOUTH ONCE A DAY. 01/10/21   [name], Adrian Blackwater, MD  metoprolol tartrate (LOPRESSOR) 25 MG tablet TAKE (1) TABLET BY MOUTH TWICE DAILY. 12/12/20   [name], Adrian Blackwater, MD  Multiple Vitamin (MULTIVITAMIN) tablet Take 1 tablet by mouth daily.    [provider]  pantoprazole (PROTONIX) 40 MG tablet TAKE 1 TABLET BY MOUTH ONCE DAILY BEFORE BREAKFAST. 09/06/20   Aliene Altes S, PA-C  pioglitazone (ACTOS) 15 MG tablet TAKE ONE TABLET BY MOUTH ONCE DAILY. 11/28/20   [name], Adrian Blackwater, MD  ZOLOFT 100 MG tablet Take 200 mg by mouth daily. 08/11/14   [provider]    Allergies    Pineapple, Latex, Morphine and related, and Statins  Review of Systems   Review of Systems  Constitutional:  Negative for fever.  Cardiovascular:  Negative for chest pain.  Gastrointestinal:  Positive for abdominal pain. Negative for blood in  stool, diarrhea, hematemesis, hematochezia and vomiting.  Genitourinary:  Negative for dysuria.  All other systems reviewed and are negative.  Physical Exam Updated Vital Signs BP (!) 142/81 (BP Location: Right Arm)    Pulse 98    Temp 98.4 F (36.9 C) (Oral)    Resp 16    Ht 1.778 m (5\' 10" )    Wt 120.2 kg    SpO2 97%    BMI 38.02 kg/m  Physical Exam CONSTITUTIONAL: Well developed/well nourished HEAD: Normocephalic/atraumatic EYES: EOM ENMT: Mask in place NECK: supple no meningeal signs SPINE/BACK:entire spine nontender CV: S1/S2 noted, no murmurs/rubs/gallops noted LUNGS: Lungs are clear to auscultation bilaterally, no apparent distress ABDOMEN: soft, moderate LUQ and LLQ tenderness, no rebound or guarding, bowel sounds noted throughout abdomen No abdominal wall hernia GU:no cva tenderness NEURO: Pt is awake/alert/appropriate, moves all extremitiesx4.  No facial droop.   EXTREMITIES: pulses normal/equal, full ROM SKIN: warm, color normal PSYCH: no abnormalities of mood noted, alert and oriented to situation  ED Results / Procedures / Treatments   Labs (all labs ordered are listed, but only abnormal results are displayed) Labs Reviewed  COMPREHENSIVE METABOLIC PANEL - Abnormal; Notable for the following components:      Result Value   Glucose, Bld 182 (*)    BUN 21 (*)    Total Bilirubin 0.1 (*)    All other components within normal limits  CBC - Abnormal; Notable for the following components:   RBC 3.94 (*)    HCT 36.3 (*)    All other components within normal limits  LIPASE, BLOOD  URINALYSIS, ROUTINE W REFLEX MICROSCOPIC    EKG EKG Interpretation  Date/Time:  Thursday January 26 2021 01:29:43 EST Ventricular Rate:  95 PR Interval:  178 QRS Duration: 94 QT Interval:  335 QTC Calculation: 422 R Axis:   71 Text Interpretation: Sinus rhythm Nonspecific T abnormalities, lateral leads Baseline wander in lead(s) V2 Interpretation limited secondary to artifact  Confirmed by Ripley Fraise [phone]) on 01/26/2021 1:43:42 AM  Radiology CT ABDOMEN PELVIS W CONTRAST  Result Date: 01/26/2021 CLINICAL DATA:  Left lower quadrant abdominal pain. EXAM: CT ABDOMEN AND PELVIS WITH CONTRAST TECHNIQUE: Multidetector CT imaging of the abdomen and pelvis was performed using the standard protocol following bolus administration of intravenous contrast. CONTRAST:  160mL OMNIPAQUE IOHEXOL 300 MG/ML  SOLN COMPARISON:  Abdominal CT dated 11/17/2018. FINDINGS: Lower chest: The visualized lung bases are clear. No intra-abdominal free air or free fluid. Hepatobiliary: Fatty liver. No intrahepatic biliary dilatation. Cholecystectomy. No retained calcified stone noted in the central CBD. Pancreas: Unremarkable. No pancreatic ductal dilatation or surrounding inflammatory changes. Spleen: Normal in size without focal abnormality. Adrenals/Urinary Tract: The adrenal glands are unremarkable. The kidneys, visualized ureters, and the bladder appear unremarkable. Stomach/Bowel: There is no bowel obstruction or active inflammation. The appendix is normal. Vascular/Lymphatic: The abdominal aorta and IVC unremarkable. No portal venous gas. There is no adenopathy. Reproductive: The prostate and seminal vesicles are grossly unremarkable. No pelvic mass. Other: None Musculoskeletal: Mild degenerative changes of the spine. No acute osseous pathology. Bilateral L5 pars defects. IMPRESSION: 1. No acute intra-abdominal or pelvic pathology. No bowel obstruction. Normal appendix. 2. Fatty liver. Electronically Signed   By: Anner Crete M.D.   On: 01/26/2021 02:36    Procedures Procedures   Medications Ordered in ED Medications  fentaNYL (SUBLIMAZE) injection 100 mcg (100 mcg Intravenous Given 01/26/21 0134)  iohexol (OMNIPAQUE) 300 MG/ML solution 100 mL (100 mLs Intravenous Contrast Given 01/26/21 0208)    ED Course  I have reviewed the triage vital signs and the nursing notes.  Pertinent labs  & imaging results that were available during my care of the patient were reviewed by me and considered in my medical decision making (see chart for details).    MDM Rules/Calculators/A&P                         This  patient presents to the ED for concern of abdominal pain, this involves an extensive number of treatment options, and is a complaint that carries with it a high risk of complications and morbidity.  The differential diagnosis includes pancreatitis, kidney stone, UTI, pyelonephritis, diverticulitis, colitis, bowel perforation, bowel obstruction     Lab Tests:  I Ordered, reviewed, and interpreted labs.  The pertinent results include: No leukocytosis   Imaging Studies ordered:  I ordered imaging studies including CT abdomen pelvis I independently visualized and interpreted imaging which showed no acute findings I agree with the radiologist interpretation     Medicines ordered and prescription drug management:  I ordered medication including fentanyl for pain Reevaluation of the patient after these medicines showed that the patient improved I have reviewed the patients home medicines and have made adjustments as needed     Reevaluation:  After the interventions noted above, I reevaluated the patient and found that they have :improved   Dispostion:  After consideration of the diagnostic results and the patients response to treatment feel that the patent would benefit from discharge home.   Patient presents w/left upper quadrant lower quadrant abdominal pain.  CT imaging is negative.  Patient appears improved.  He will be discharged home.  We discussed strict  return precautions  Final Clinical Impression(s) / ED Diagnoses Final diagnoses:  Left lower quadrant abdominal pain    Rx / DC Orders ED Discharge Orders     None        Ripley Fraise, MD 01/26/21 [phone]

## 2021-01-27 ENCOUNTER — Telehealth: Payer: Self-pay

## 2021-01-27 ENCOUNTER — Other Ambulatory Visit: Payer: Self-pay

## 2021-01-27 MED ORDER — METOPROLOL TARTRATE 25 MG PO TABS
25.0000 mg | ORAL_TABLET | Freq: Two times a day (BID) | ORAL | 1 refills | Status: DC
Start: 2021-01-27 — End: 2021-02-01

## 2021-01-27 MED ORDER — LISINOPRIL 10 MG PO TABS: 10.0000 mg | ORAL_TABLET | Freq: Every day | ORAL | 1 refills | Status: DC

## 2021-01-27 MED ORDER — PIOGLITAZONE HCL 15 MG PO TABS: 15.0000 mg | ORAL_TABLET | Freq: Every day | ORAL | 1 refills | Status: DC

## 2021-01-27 NOTE — Telephone Encounter (Signed)
Refill request received for dicyclomine 20 mg tablet and pantoprazole 40 mg tablet. To be sent to [facility]. Last office visit was on 05/24/20 with Lewie Loron.

## 2021-01-31 ENCOUNTER — Telehealth: Payer: Self-pay | Admitting: Family Medicine

## 2021-01-31 ENCOUNTER — Other Ambulatory Visit: Payer: Self-pay | Admitting: Internal Medicine

## 2021-01-31 MED ORDER — ICOSAPENT ETHYL 1 G PO CAPS: 2.0000 g | ORAL_CAPSULE | Freq: Two times a day (BID) | ORAL | 0 refills | Status: DC

## 2021-01-31 MED ORDER — GEMFIBROZIL 600 MG PO TABS: 600.0000 mg | ORAL_TABLET | Freq: Two times a day (BID) | ORAL | 0 refills | Status: DC

## 2021-01-31 NOTE — Telephone Encounter (Signed)
Bobby Jimenez from ([facility]) [phone] calling on behalf of pt. Sent in 3 medication refills for pt.   lisinopril lisinopril (ZESTRIL) 10 MG tablet   metoprolol tartrate metoprolol tartrate (LOPRESSOR) 25 MG tablet  pioglitazone pioglitazone (ACTOS)

## 2021-02-01 ENCOUNTER — Other Ambulatory Visit: Payer: Self-pay | Admitting: Internal Medicine

## 2021-02-01 ENCOUNTER — Telehealth: Payer: Self-pay | Admitting: *Deleted

## 2021-02-01 MED ORDER — PIOGLITAZONE HCL 15 MG PO TABS: 15.0000 mg | ORAL_TABLET | Freq: Every day | ORAL | 1 refills | Status: DC

## 2021-02-01 MED ORDER — METOPROLOL TARTRATE 25 MG PO TABS: 25.0000 mg | ORAL_TABLET | Freq: Two times a day (BID) | ORAL | 1 refills | Status: DC

## 2021-02-01 MED ORDER — LISINOPRIL 10 MG PO TABS: 10.0000 mg | ORAL_TABLET | Freq: Every day | ORAL | 1 refills | Status: DC

## 2021-02-01 NOTE — Telephone Encounter (Signed)
Refills sent, pharmacy was made aware.

## 2021-02-01 NOTE — Telephone Encounter (Signed)
Dicyclomine 10 mg take 1 capsule by mouth 4 times daily with meals and at bedtimes Pantoprazole 40 mg take 1 tablet by mouth once daily before breakfast.  Please send prescription  to [facility]

## 2021-02-02 ENCOUNTER — Other Ambulatory Visit: Payer: Self-pay | Admitting: Gastroenterology

## 2021-02-02 DIAGNOSIS — K219 Gastro-esophageal reflux disease without esophagitis: Secondary | ICD-10-CM

## 2021-02-02 DIAGNOSIS — K580 Irritable bowel syndrome with diarrhea: Secondary | ICD-10-CM

## 2021-02-02 MED ORDER — PANTOPRAZOLE SODIUM 40 MG PO TBEC: 40.0000 mg | DELAYED_RELEASE_TABLET | Freq: Every day | ORAL | 3 refills | Status: DC

## 2021-02-02 MED ORDER — DICYCLOMINE HCL 10 MG PO CAPS: ORAL_CAPSULE | ORAL | 5 refills | Status: DC

## 2021-02-02 NOTE — Telephone Encounter (Signed)
Rx sent 

## 2021-02-08 ENCOUNTER — Ambulatory Visit: Payer: Medicare Other

## 2021-02-22 ENCOUNTER — Ambulatory Visit (INDEPENDENT_AMBULATORY_CARE_PROVIDER_SITE_OTHER): Payer: Medicare HMO

## 2021-02-22 ENCOUNTER — Other Ambulatory Visit: Payer: Self-pay

## 2021-02-22 DIAGNOSIS — Z0000 Encounter for general adult medical examination without abnormal findings: Secondary | ICD-10-CM

## 2021-02-22 NOTE — Patient Instructions (Signed)
Bobby Jimenez , Thank you for taking time to come for your Medicare Wellness Visit. I appreciate your ongoing commitment to your health goals. Please review the following plan we discussed and let me know if I can assist you in the future.   Screening recommendations/referrals: Recommended yearly ophthalmology/optometry visit for glaucoma screening and checkup Recommended yearly dental visit for hygiene and checkup  Vaccinations: Influenza vaccine: Done 12/01/20 repeat every year Pneumococcal vaccine: Done 11/29/16 Tdap vaccine: Done 09/04/20 repeat every 10 days   Covid-19: Completed 4/29, 06/25/19 & 09/02/20  Advanced directives: Advance directive discussed with you today. Even though you declined this today please call our office should you change your mind and we can give you the proper paperwork for you to fill out.  Conditions/risks identified: Lose weight and keep in shape   Next appointment: Follow up in one year for your annual wellness visit   Preventive Care 40-64 Years, Male Preventive care refers to lifestyle choices and visits with your health care provider that can promote health and wellness. What does preventive care include? A yearly physical exam. This is also called an annual well check. Dental exams once or twice a year. Routine eye exams. Ask your health care provider how often you should have your eyes checked. Personal lifestyle choices, including: Daily care of your teeth and gums. Regular physical activity. Eating a healthy diet. Avoiding tobacco and drug use. Limiting alcohol use. Practicing safe sex. Taking low-dose aspirin every day starting at age 59. What happens during an annual well check? The services and screenings done by your health care provider during your annual well check will depend on your age, overall health, lifestyle risk factors, and family history of disease. Counseling  Your health care provider may ask you questions about your: Alcohol  use. Tobacco use. Drug use. Emotional well-being. Home and relationship well-being. Sexual activity. Eating habits. Work and work Astronomer. Screening  You may have the following tests or measurements: Height, weight, and BMI. Blood pressure. Lipid and cholesterol levels. These may be checked every 5 years, or more frequently if you are over 58 years old. Skin check. Lung cancer screening. You may have this screening every year starting at age 36 if you have a 30-pack-year history of smoking and currently smoke or have quit within the past 15 years. Fecal occult blood test (FOBT) of the stool. You may have this test every year starting at age 36. Flexible sigmoidoscopy or colonoscopy. You may have a sigmoidoscopy every 5 years or a colonoscopy every 10 years starting at age 69. Prostate cancer screening. Recommendations will vary depending on your family history and other risks. Hepatitis C blood test. Hepatitis B blood test. Sexually transmitted disease (STD) testing. Diabetes screening. This is done by checking your blood sugar (glucose) after you have not eaten for a while (fasting). You may have this done every 1-3 years. Discuss your test results, treatment options, and if necessary, the need for more tests with your health care provider. Vaccines  Your health care provider may recommend certain vaccines, such as: Influenza vaccine. This is recommended every year. Tetanus, diphtheria, and acellular pertussis (Tdap, Td) vaccine. You may need a Td booster every 10 years. Zoster vaccine. You may need this after age 34. Pneumococcal 13-valent conjugate (PCV13) vaccine. You may need this if you have certain conditions and have not been vaccinated. Pneumococcal polysaccharide (PPSV23) vaccine. You may need one or two doses if you smoke cigarettes or if you have certain conditions. Talk  to your health care provider about which screenings and vaccines you need and how often you need  them. This information is not intended to replace advice given to you by your health care provider. Make sure you discuss any questions you have with your health care provider. Document Released: 02/11/2015 Document Revised: 10/05/2015 Document Reviewed: 11/16/2014 Elsevier Interactive Patient Education  2017 [employer] [place] Prevention in the Home Falls can cause injuries. They can happen to people of all ages. There are many things you can do to make your home safe and to help prevent falls. What can I do on the outside of my home? Regularly fix the edges of walkways and driveways and fix any cracks. Remove anything that might make you trip as you walk through a door, such as a raised step or threshold. Trim any bushes or trees on the path to your home. Use bright outdoor lighting. Clear any walking paths of anything that might make someone trip, such as rocks or tools. Regularly check to see if handrails are loose or broken. Make sure that both sides of any steps have handrails. Any raised decks and porches should have guardrails on the edges. Have any leaves, snow, or ice cleared regularly. Use sand or salt on walking paths during winter. Clean up any spills in your garage right away. This includes oil or grease spills. What can I do in the bathroom? Use night lights. Install grab bars by the toilet and in the tub and shower. Do not use towel bars as grab bars. Use non-skid mats or decals in the tub or shower. If you need to sit down in the shower, use a plastic, non-slip stool. Keep the floor dry. Clean up any water that spills on the floor as soon as it happens. Remove soap buildup in the tub or shower regularly. Attach bath mats securely with double-sided non-slip rug tape. Do not have throw rugs and other things on the floor that can make you trip. What can I do in the bedroom? Use night lights. Make sure that you have a light by your bed that is easy to reach. Do not use  any sheets or blankets that are too big for your bed. They should not hang down onto the floor. Have a firm chair that has side arms. You can use this for support while you get dressed. Do not have throw rugs and other things on the floor that can make you trip. What can I do in the kitchen? Clean up any spills right away. Avoid walking on wet floors. Keep items that you use a lot in easy-to-reach places. If you need to reach something above you, use a strong step stool that has a grab bar. Keep electrical cords out of the way. Do not use floor polish or wax that makes floors slippery. If you must use wax, use non-skid floor wax. Do not have throw rugs and other things on the floor that can make you trip. What can I do with my stairs? Do not leave any items on the stairs. Make sure that there are handrails on both sides of the stairs and use them. Fix handrails that are broken or loose. Make sure that handrails are as long as the stairways. Check any carpeting to make sure that it is firmly attached to the stairs. Fix any carpet that is loose or worn. Avoid having throw rugs at the top or bottom of the stairs. If you do have throw rugs,  attach them to the floor with carpet tape. Make sure that you have a light switch at the top of the stairs and the bottom of the stairs. If you do not have them, ask someone to add them for you. What else can I do to help prevent falls? Wear shoes that: Do not have high heels. Have rubber bottoms. Are comfortable and fit you well. Are closed at the toe. Do not wear sandals. If you use a stepladder: Make sure that it is fully opened. Do not climb a closed stepladder. Make sure that both sides of the stepladder are locked into place. Ask someone to hold it for you, if possible. Clearly mark and make sure that you can see: Any grab bars or handrails. First and last steps. Where the edge of each step is. Use tools that help you move around (mobility aids)  if they are needed. These include: Canes. Walkers. Scooters. Crutches. Turn on the lights when you go into a dark area. Replace any light bulbs as soon as they burn out. Set up your furniture so you have a clear path. Avoid moving your furniture around. If any of your floors are uneven, fix them. If there are any pets around you, be aware of where they are. Review your medicines with your doctor. Some medicines can make you feel dizzy. This can increase your chance of falling. Ask your doctor what other things that you can do to help prevent falls. This information is not intended to replace advice given to you by your health care provider. Make sure you discuss any questions you have with your health care provider. Document Released: 11/11/2008 Document Revised: 06/23/2015 Document Reviewed: 02/19/2014 Elsevier Interactive Patient Education  2017 [employer].

## 2021-02-22 NOTE — Progress Notes (Signed)
Virtual Visit via Telephone Note  I connected with  Bobby Jimenez on 02/22/21 at  8:00 AM EST by telephone and verified that I am speaking with the correct person using two identifiers.  Medicare Annual Wellness visit completed telephonically due to Covid-19 pandemic.   Persons participating in this call: This Health Coach and this patient.   Location: Patient: Home Provider: Office   I discussed the limitations, risks, security and privacy concerns of performing an evaluation and management service by telephone and the availability of in person appointments. The patient expressed understanding and agreed to proceed.  Unable to perform video visit due to video visit attempted and failed and/or patient does not have video capability.   Some vital signs may be absent or patient reported.   Marzella Schleinina H Ciara Kagan, LPN   Subjective:   Bobby Jimenez is a 41 y.o. male who presents for Medicare Annual/Subsequent preventive examination.  Review of Systems     Cardiac Risk Factors include: diabetes mellitus;hypertension;male gender;obesity (BMI >30kg/m2)     Objective:    There were no vitals filed for this visit. There is no height or weight on file to calculate BMI.  Advanced Directives 02/22/2021 01/25/2021 09/04/2020 01/27/2020 12/26/2016 12/23/2016 12/23/2016  Does Patient Have a Medical Advance Directive? No No No No No No No  Would patient like information on creating a medical advance directive? No - Patient declined No - Patient declined - Yes (MAU/Ambulatory/Procedural Areas - Information given) No - Patient declined No - Patient declined -    Current Medications (verified) Outpatient Encounter Medications as of 02/22/2021  Medication Sig   ABILIFY 10 MG tablet Take 5 mg by mouth 2 (two) times daily.    amantadine (SYMMETREL) 100 MG capsule Take 100 mg by mouth 2 (two) times daily.    clonazePAM (KLONOPIN) 0.5 MG tablet Take 0.25-0.5 mg by mouth 2 (two) times daily. Takes  1/2 tablet in am and 1 tablet at bedtime   dicyclomine (BENTYL) 10 MG capsule TAKE 1 CAPSULE BY MOUTH 3 TIMES DAILY WITH MEALS AND AT BEDTIME AS NEEDED. HOLD FOR CONSTIPATION.   fluticasone (FLONASE) 50 MCG/ACT nasal spray INSTILL 2 SPRAYS INTO EACH NOSTRIL DAILY.   gemfibrozil (LOPID) 600 MG tablet Take 1 tablet (600 mg total) by mouth 2 (two) times daily before a meal. <PLEASE MAKE APPOINTMENT FOR REFILLS>   icosapent Ethyl (VASCEPA) 1 g capsule Take 2 capsules (2 g total) by mouth 2 (two) times daily. <PLEASE MAKE APPOINTMENT FOR REFILLS>   lisinopril (ZESTRIL) 10 MG tablet Take 1 tablet (10 mg total) by mouth daily.   metoprolol tartrate (LOPRESSOR) 25 MG tablet Take 1 tablet (25 mg total) by mouth 2 (two) times daily.   Multiple Vitamin (MULTIVITAMIN) tablet Take 1 tablet by mouth daily.   pantoprazole (PROTONIX) 40 MG tablet Take 1 tablet (40 mg total) by mouth daily before breakfast.   pioglitazone (ACTOS) 15 MG tablet Take 1 tablet (15 mg total) by mouth daily.   ZOLOFT 100 MG tablet Take 200 mg by mouth daily.   fexofenadine (ALLEGRA) 180 MG tablet Take 1 tablet (180 mg total) by mouth daily.   No facility-administered encounter medications on file as of 02/22/2021.    Allergies (verified) Pineapple, Latex, Morphine and related, and Statins   History: Past Medical History:  Diagnosis Date   ADHD (attention deficit hyperactivity disorder)    Anxiety    Bilateral carpal tunnel syndrome    transient relief from inj by ortho 04/2020; NCS  confirmed bilat CTS->surgery planned as of 05/2020 ortho f/u   Bipolar disorder [facility])    Cervical radiculopathy    Diabetes mellitus (HCC) 08/2020   Aug 2022->fasting gluc 130, Hba1c 7.6%   Dizziness    lightheaded   Food allergy    pineapple   GERD (gastroesophageal reflux disease)    Headache syndrome    Hepatic steatosis    u/s and CT confirmed 2020--no cirrhosis.   Hypertension    Hypertriglyceridemia    Very high (>800 while on vascepa  and rosuva 06/2019)->rosuva and vascepa d/c'd and gemfibrozil started, also referred pt to advanced lipid clinic   IBS (irritable bowel syndrome)    D.  bentyl helping ([facility])   Latex allergy    Low back pain    Mixed hyperlipidemia    OSA on CPAP 10/16/2016   Tremors of nervous system    arms, dx'd as lasting side effect from lithium use in remote past: psych has him on amantadine for this.   Vertigo    Past Surgical History:  Procedure Laterality Date   CARPAL TUNNEL RELEASE Left    L 05/2020.  Plan for R side to be done.   CARPAL TUNNEL RELEASE Right    07/2020   CHOLECYSTECTOMY N/A 12/26/2016   Procedure: LAPAROSCOPIC CHOLECYSTECTOMY;  Surgeon: Franky Macho, MD;  Location: [facility];  Service: General;  Laterality: N/A;   KNEE SURGERY Right 1997   arthroscopic   polysomnogram  09/2016   Mild/mod OSA->CPAP (Dr. Lestine Mount)   WISDOM TOOTH EXTRACTION  1997   Family History  Adopted: Yes  Problem Relation Age of Onset   High blood pressure Mother    Seizures Mother    Colon cancer Neg Hx    Social History   Socioeconomic History   Marital status: Divorced    Spouse name: Not on file   Number of children: 0   Years of education: 13   Highest education level: Not on file  Occupational History   Not on file  Tobacco Use   Smoking status: Former    Packs/day: 1.00    Years: 10.00    Pack years: 10.00    Types: Cigarettes   Smokeless tobacco: Current    Types: Snuff   Tobacco comments:    quit Nov 2018  Vaping Use   Vaping Use: Never used  Substance and Sexual Activity   Alcohol use: Yes    Alcohol/week: 1.0 standard drink    Types: 1 Cans of beer per week    Comment: heavy in past, short period of time in 20s.; 05/24/20 occ   Drug use: No    Comment: Quit 1996   Sexual activity: Not on file  Other Topics Concern   Not on file  Social History Narrative   Divorced, no children.   Educ: college at [employer]   Patient lives at home with his father and  unemployed.  Disability for psych reasons.   Education some college.   Both handed.   Caffeine three cups daily soda and coffee.   Alc: occ beer (remote hx of alc abuse, marijuana, acid--no IV drugs) in 2005-2006, surround his problems coping with GF's death.   Tobacco: former cig smoker, 20 pack-yr hx , quit 2018.   Social Determinants of Health   Financial Resource Strain: Low Risk    Difficulty of Paying Living Expenses: Not hard at all  Food Insecurity: No Food Insecurity   Worried About Programme researcher, broadcasting/film/video in  the Last Year: Never true   Ran Out of Food in the Last Year: Never true  Transportation Needs: No Transportation Needs   Lack of Transportation (Medical): No   Lack of Transportation (Non-Medical): No  Physical Activity: Sufficiently Active   Days of Exercise per Week: 5 days   Minutes of Exercise per Session: 120 min  Stress: No Stress Concern Present   Feeling of Stress : Not at all  Social Connections: Socially Isolated   Frequency of Communication with Friends and Family: Once a week   Frequency of Social Gatherings with Friends and Family: More than three times a week   Attends Religious Services: Never   Database administratorActive Member of Clubs or Organizations: No   Attends BankerClub or Organization Meetings: Never   Marital Status: Divorced    Tobacco Counseling Ready to quit: Not Answered Counseling given: Not Answered Tobacco comments: quit Nov 2018   Clinical Intake:  Pre-visit preparation completed: Yes  Pain : No/denies pain     BMI - recorded: 38.02 Nutritional Status: BMI > 30  Obese Nutritional Risks: None Diabetes: Yes CBG done?: No Did pt. bring in CBG monitor from home?: No  How often do you need to have someone help you when you read instructions, pamphlets, or other written materials from your doctor or pharmacy?: 1 - Never  Diabetic?No  Interpreter Needed?: No  Information entered by :: Lanier Ensignina Sarahi Borland, LPN   Activities of Daily Living In your  present state of health, do you have any difficulty performing the following activities: 02/22/2021  Hearing? Y  Comment slight hearing loss  Vision? N  Difficulty concentrating or making decisions? N  Walking or climbing stairs? N  Dressing or bathing? N  Doing errands, shopping? N  Preparing Food and eating ? N  Using the Toilet? N  In the past six months, have you accidently leaked urine? N  Do you have problems with loss of bowel control? N  Managing your Medications? N  Managing your Finances? N  Housekeeping or managing your Housekeeping? N  Some recent data might be hidden    Patient Care Team: Jeoffrey MassedMcGowen, Philip H, MD as PCP - General (Family Medicine) Jena Gaussourk, Gerrit Friendsobert M, MD as Consulting Physician (Gastroenterology) Thedore MinsAkintayo, Mojeed, MD as Consulting Physician (Psychiatry) Beryle Beamsoonquah, Kofi, MD as Consulting Physician (Neurology) Rennis GoldenHilty, Lisette AbuKenneth C, MD as Consulting Physician (Cardiology) Mack Hookhompson, David, MD as Consulting Physician (Orthopedic Surgery)  Indicate any recent Medical Services you may have received from other than [facility] providers in the past year (date may be approximate).     Assessment:   This is a routine wellness examination for Chrissie NoaWilliam.  Hearing/Vision screen Hearing Screening - Comments:: Pt has a slight loss of hearing  Vision Screening - Comments:: Pt follows up with [facility] for annual eye exams   Dietary issues and exercise activities discussed: Current Exercise Habits: Home exercise routine, Type of exercise: walking;Other - see comments, Time (Minutes): > 60, Frequency (Times/Week): 5, Weekly Exercise (Minutes/Week): 0   Goals Addressed             This Visit's Progress    Patient Stated       Lose weight and keep in shape        Depression Screen PHQ 2/9 Scores 02/22/2021 12/01/2020 02/17/2020 01/27/2020 02/23/2019 01/07/2019  PHQ - 2 Score 0 0 0 0 0 0    Fall Risk Fall Risk  02/22/2021 02/17/2020 01/27/2020 02/23/2019  Falls  in the past year? 0 0 0  0  Number falls in past yr: 0 0 0 0  Injury with Fall? 0 0 0 0  Risk for fall due to : Impaired vision - - -  Follow up Falls prevention discussed - Falls prevention discussed -    FALL RISK PREVENTION PERTAINING TO THE HOME:  Any stairs in or around the home? Yes  If so, are there any without handrails? No  Home free of loose throw rugs in walkways, pet beds, electrical cords, etc? Yes  Adequate lighting in your home to reduce risk of falls? Yes   ASSISTIVE DEVICES UTILIZED TO PREVENT FALLS:  Life alert? No  Use of a cane, walker or w/c? No  Grab bars in the bathroom? No  Shower chair or bench in shower? No  Elevated toilet seat or a handicapped toilet? No   TIMED UP AND GO:  Was the test performed? No .   Cognitive Function:     6CIT Screen 02/22/2021  What Year? 0 points  What month? 0 points  What time? 0 points  Count back from 20 0 points  Months in reverse 4 points  Repeat phrase 2 points  Total Score 6    Immunizations Immunization History  Administered Date(s) Administered   Influenza,inj,Quad PF,6+ Mos 12/01/2020   Moderna SARS-COV2 Booster Vaccination 09/02/2020   Moderna Sars-Covid-2 Vaccination 05/28/2019, 06/25/2019   PNEUMOCOCCAL CONJUGATE-20 12/01/2020   Pneumococcal Polysaccharide-23 11/29/2016   Tdap 01/29/2013, 09/04/2020    TDAP status: Up to date  Flu Vaccine status: Up to date  Pneumococcal vaccine status: Up to date  Covid-19 vaccine status: Completed vaccines  Qualifies for Shingles Vaccine? No   Zostavax completed No   Shingrix Completed?: No.    Education has been provided regarding the importance of this vaccine. Patient has been advised to call insurance company to determine out of pocket expense if they have not yet received this vaccine. Advised may also receive vaccine at local pharmacy or Health Dept. Verbalized acceptance and understanding.  Screening Tests Health Maintenance  Topic Date Due    COVID-19 Vaccine (3 - Moderna risk series) 09/30/2020   TETANUS/TDAP  09/05/2030   INFLUENZA VACCINE  Completed   Hepatitis C Screening  Completed   HIV Screening  Completed   HPV VACCINES  Aged Out    Health Maintenance  Health Maintenance Due  Topic Date Due   COVID-19 Vaccine (3 - Moderna risk series) 09/30/2020       Additional Screening:  Hepatitis C Screening:  Completed 08/25/20  Vision Screening: Recommended annual ophthalmology exams for early detection of glaucoma and other disorders of the eye. Is the patient up to date with their annual eye exam?  Yes  Who is the provider or what is the name of the office in which the patient attends annual eye exams? [facility]  If pt is not established with a provider, would they like to be referred to a provider to establish care? No .   Dental Screening: Recommended annual dental exams for proper oral hygiene  Community Resource Referral / Chronic Care Management: CRR required this visit?  No   CCM required this visit?  No      Plan:     I have personally reviewed and noted the following in the patients chart:   Medical and social history Use of alcohol, tobacco or illicit drugs  Current medications and supplements including opioid prescriptions. Patient is not currently taking opioid prescriptions. Functional ability and status Nutritional status Physical  activity Advanced directives List of other physicians Hospitalizations, surgeries, and ER visits in previous 12 months Vitals Screenings to include cognitive, depression, and falls Referrals and appointments  In addition, I have reviewed and discussed with patient certain preventive protocols, quality metrics, and best practice recommendations. A written personalized care plan for preventive services as well as general preventive health recommendations were provided to patient.     Marzella Schlein, LPN   1/61/0960   Nurse Notes:  None

## 2021-02-23 ENCOUNTER — Ambulatory Visit: Payer: Medicare Other | Admitting: Family Medicine

## 2021-03-02 ENCOUNTER — Other Ambulatory Visit: Payer: Self-pay

## 2021-03-02 ENCOUNTER — Ambulatory Visit (INDEPENDENT_AMBULATORY_CARE_PROVIDER_SITE_OTHER): Payer: Medicare HMO | Admitting: Family Medicine

## 2021-03-02 ENCOUNTER — Encounter: Payer: Self-pay | Admitting: Family Medicine

## 2021-03-02 VITALS — BP 114/73 | HR 74 | Temp 98.0°F | Ht 70.0 in | Wt 258.8 lb

## 2021-03-02 DIAGNOSIS — E782 Mixed hyperlipidemia: Secondary | ICD-10-CM | POA: Diagnosis not present

## 2021-03-02 DIAGNOSIS — M79671 Pain in right foot: Secondary | ICD-10-CM

## 2021-03-02 DIAGNOSIS — E119 Type 2 diabetes mellitus without complications: Secondary | ICD-10-CM

## 2021-03-02 DIAGNOSIS — I10 Essential (primary) hypertension: Secondary | ICD-10-CM

## 2021-03-02 DIAGNOSIS — R251 Tremor, unspecified: Secondary | ICD-10-CM | POA: Diagnosis not present

## 2021-03-02 LAB — LIPID PANEL
Cholesterol: 186 mg/dL (ref 0–200)
HDL: 35.9 mg/dL — ABNORMAL LOW (ref >39.00)
NonHDL: 150.07
Total CHOL/HDL Ratio: 5
Triglycerides: 289 mg/dL — ABNORMAL HIGH (ref 0.0–149.0)
VLDL: 57.8 mg/dL — ABNORMAL HIGH (ref 0.0–40.0)

## 2021-03-02 LAB — HEMOGLOBIN A1C: Hgb A1c MFr Bld: 5.9 % (ref 4.6–6.5)

## 2021-03-02 LAB — LDL CHOLESTEROL, DIRECT: Direct LDL: 119 mg/dL

## 2021-03-02 MED ORDER — AMANTADINE HCL 100 MG PO CAPS: 100.0000 mg | ORAL_CAPSULE | Freq: Two times a day (BID) | ORAL | 0 refills | Status: AC

## 2021-03-02 NOTE — Progress Notes (Signed)
OFFICE VISIT  03/02/2021  CC:  Chief Complaint  Patient presents with   Follow-up    RCI, pt is fasting    HPI:    Patient is a 41 y.o. male who presents for 3 mo f/u diabetes, hypertension, and hyperlipidemia. A/P as of last visit: "1.) DM -- Patient has a past HbA1c of 7.6 that it will be rechecked today along with urine cr/albumin. Prescribed Actos for glucose control and no side effects (fluid retention and edema) were noted. Patient also denies symptoms of poor glucose control (polyuria, polydyspsia, sweats). Will continue Actos and reassess medication changes after HbA1c is completed. If HbA1c is elevated, will increase Actos prescription or add on a Flozin medication. If Cr/albumin is elevated, plan to increase Lisinopril to 60m daily. He has optometrist.   2) HTN: well controlled with lopressor 222mbid and lisinopril 10 qd -- ordered lytes/cr today   3) HLD: improved at last visit 01/2019 with Dr. HiDebara PickettContinuing on vascepa and gemfibrozil. lipid panel, Apo B level, and hepatic panel ordered--forward/CC results to Dr. HiDebara Pickett  4) Hepatic steatosis, obesity class II. He's continuing to try to make better diet choices and plans on starting walking program when the hot weather abates some.   5) Health maintenance exam: Reviewed age and gender appropriate health maintenance issues (prudent diet, regular exercise, health risks of tobacco and excessive alcohol, use of seatbelts, fire alarms in home, use of sunscreen).  Also reviewed age and gender appropriate health screening as well as vaccine recommendations. Vaccines: ALL UTD. Flu -> given, Pneumococcal vaccine 20 -> given Labs: cmet, HbA1c, direct LDL, lipid panel, TSH, CBC, albumin/cr, flp, apolipoprotein B, hep c screening-->future when fasting."  INTERIM HX: We and feels well.  He was seen in the ED for left-sided upper and lower abdominal pain on 01/26/2021: Urinalysis, c-Met, CBC, lipase all normal. 01/26/2021 CT  abdomen pelvis with contrast showed no abnormalities other than fatty liver. Discharged to home from the ED.  No new medications or referrals. Says the pain resolved not long after that ED visit.  He has improved his exercise habits, plays basketball pretty regularly. 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 He is down 15 pounds over the last 3 months.  No home blood pressure or sugar monitoring.  He complains of about 1 week history of pain on the top of his right foot.  No preceding trauma/injury. He does recall playing basketball before it started hurting. No redness.  It is causing some limp. No history of similar pain, no history of gout. He has not taken any medication for this or applied any ice or heat.  ROS as above, plus--> no fevers, no CP, no SOB, no wheezing, no cough, no dizziness, no HAs, no rashes, no melena/hematochezia.  No polyuria or polydipsia.  No myalgias.  No focal weakness, paresthesias, or tremors.  No acute vision or hearing abnormalities.  No dysuria or unusual/new urinary urgency or frequency.  No recent changes in lower legs. No n/v/d or abd pain.  No palpitations.       Past Medical History:  Diagnosis Date   ADHD (attention deficit hyperactivity disorder)    Anxiety    Bilateral carpal tunnel syndrome    transient relief from inj by ortho 04/2020; NCS confirmed bilat CTS->surgery planned as of 05/2020 [place]o f[place]   [place]   Cervical radiculopathy  Diabetes mellitus ([place]) 08/2020   Aug 2022->fasting gluc 130, Hba1c 7.6%   Dizziness    lightheaded   Food allergy    pineapple   GERD (gastroesophageal reflux disease)    Headache syndrome    Hepatic steatosis    u/s and CT confirmed 2020--no cirrhosis.   Hypertension    Hypertriglyceridemia    Very high (>800 while on vascepa and rosuva 06/2019)->rosuva and vascepa d/c'd and gemfibrozil started, also  referred pt to advanced lipid clinic   IBS (irritable bowel syndrome)    D.  bentyl helping ([facility])   Latex allergy    Low back pain    Mixed hyperlipidemia    OSA on CPAP 10/16/2016   Tremors of nervous system    arms, dx'd as lasting side effect from lithium use in remote past: psych has him on amantadine for this.   Vertigo     Past Surgical History:  Procedure Laterality Date   CARPAL TUNNEL RELEASE Left    L 05/2020.  Plan for R side to be done.   CARPAL TUNNEL RELEASE Right    07/2020   CHOLECYSTECTOMY N/A 12/26/2016   Procedure: LAPAROSCOPIC CHOLECYSTECTOMY;  Surgeon: Aviva Signs, MD;  Location: [facility];  Service: General;  Laterality: N/A;   KNEE SURGERY Right 1997   arthroscopic   polysomnogram  09/2016   Mild/mod OSA->CPAP (Dr. Joneen Caraway)   [place]    Outpatient Medications Prior to Visit  Medication Sig Dispense Refill   ABILIFY 10 MG tablet Take 5 mg by mouth 2 (two) times daily.      clonazePAM (KLONOPIN) 0.5 MG tablet Take 0.25-0.5 mg by mouth 2 (two) times daily. Takes 1/2 tablet in am and 1 tablet at bedtime     dicyclomine (BENTYL) 10 MG capsule TAKE 1 CAPSULE BY MOUTH 3 TIMES DAILY WITH MEALS AND AT BEDTIME AS NEEDED. HOLD FOR CONSTIPATION. 120 capsule 5   fluticasone (FLONASE) 50 MCG/ACT nasal spray INSTILL 2 SPRAYS INTO EACH NOSTRIL DAILY. 16 g 11   gemfibrozil (LOPID) 600 MG tablet Take 1 tablet (600 mg total) by mouth 2 (two) times daily before a meal. <PLEASE MAKE APPOINTMENT FOR REFILLS> 180 tablet 0   icosapent Ethyl (VASCEPA) 1 g capsule Take 2 capsules (2 g total) by mouth 2 (two) times daily. <PLEASE MAKE APPOINTMENT FOR REFILLS> 360 capsule 0   lisinopril (ZESTRIL) 10 MG tablet Take 1 tablet (10 mg total) by mouth daily. 90 tablet 1   metoprolol tartrate (LOPRESSOR) 25 MG tablet Take 1 tablet (25 mg total) by mouth 2 (two) times daily. 180 tablet 1   Multiple Vitamin (MULTIVITAMIN) tablet Take 1 tablet by mouth daily.      pantoprazole (PROTONIX) 40 MG tablet Take 1 tablet (40 mg total) by mouth daily before breakfast. 90 tablet 3   pioglitazone (ACTOS) 15 MG tablet Take 1 tablet (15 mg total) by mouth daily. 90 tablet 1   ZOLOFT 100 MG tablet Take 200 mg by mouth daily.     amantadine (SYMMETREL) 100 MG capsule Take 100 mg by mouth 2 (two) times daily.      fexofenadine (ALLEGRA) 180 MG tablet Take 1 tablet (180 mg total) by mouth daily. 90 tablet 1   No facility-administered medications prior to visit.    Allergies  Allergen Reactions   Pineapple Shortness Of Breath and Swelling   Latex Swelling   Morphine And Related Hives and Itching   Statins     "  locks urinary tract up"    ROS As per HPI  PE: Vitals with BMI 03/02/2021 01/26/2021 01/26/2021  Height _0  - -  Weight 258 lbs 13 oz - -  BMI 09.32 - -  Systolic 671 245 809  Diastolic 73 58 63  Pulse 74 93 94     Physical Exam  Gen: Alert, well appearing.  Patient is oriented to person, place, time, and situation. AFFECT: pleasant, lucid thought and speech. CV: RRR, no m/r/g.   LUNGS: CTA bilat, nonlabored resps, good aeration in all lung fields. EXT: no clubbing or cyanosis.  no edema.  Right foot: No erythema, warmth, or obvious swelling. He is mildly tender over the dorsal aspect of the midfoot region. Ankle and toes with full range of motion.  LABS:  Last CBC Lab Results  Component Value Date   WBC 6.8 01/25/2021   HGB 13.0 01/25/2021   HCT 36.3 (L) 01/25/2021   MCV 92.1 01/25/2021   MCH 33.0 01/25/2021   RDW 13.9 01/25/2021   PLT 235 98/33/8250   Last metabolic panel Lab Results  Component Value Date   GLUCOSE 182 (H) 01/25/2021   NA 137 01/25/2021   K 3.7 01/25/2021   CL 105 01/25/2021   CO2 23 01/25/2021   BUN 21 (H) 01/25/2021   CREATININE 1.01 01/25/2021   GFRNONAA >60 01/25/2021   CALCIUM 9.1 01/25/2021   PROT 7.5 01/25/2021   ALBUMIN 4.5 01/25/2021   BILITOT 0.1 (L) 01/25/2021   ALKPHOS 45  01/25/2021   AST 26 01/25/2021   ALT 33 01/25/2021   ANIONGAP 9 01/25/2021   Last lipids Lab Results  Component Value Date   CHOL 197 12/01/2020   HDL 34.20 (L) 12/01/2020   LDLCALC 82 01/18/2020   LDLDIRECT 87.0 12/01/2020   TRIG (H) 12/01/2020    617.0 Triglyceride is over 400; calculations on Lipids are invalid.   CHOLHDL 6 12/01/2020   Last hemoglobin A1c Lab Results  Component Value Date   HGBA1C 6.4 12/01/2020   Last thyroid functions Lab Results  Component Value Date   TSH 3.77 12/01/2020   IMPRESSION AND PLAN:  #1 diabetes, well controlled on pioglitazone 15 mg/day, diet and exercise improving. Hemoglobin A1c and fasting glucose today.  #2 HTN: well controlled with lopressor 75m bid and lisinopril 10 qd. Creatinine and electrolytes normal 1 mo ago.  #3 mixed hyperlipidemia: Followed by Dr. HDebara Pickett last visit with him was about a year ago.   Continue Vascepa and gemfibrozil. Pt states that statins have caused urinary retention for him in the past so he declines any further trials.  Checking lipid panel and apo lipo B today. Hepatic panel normal 1 mo ago. CC results to Dr. HDebara Pickett  #4 right foot pain.  Unclear etiology but suspect extensor tendon etiology. Apply heat, relative rest, nonweightbearing range of motion exercises. Signs/symptoms to call or return for were reviewed and pt expressed understanding. NOTE: bedside u/s today of ankle/foot w/out abnormality.  #5 tremor.  He has a history of tremor due to remote use of lithium. He has had trouble getting this refilled through his psychiatrist/pharmacy.  We will give him a month supply to have until he gets this figured out with them.  An After Visit Summary was printed and given to the patient.  FOLLOW UP: Return in about 3 months (around 05/30/2021) for routine chronic illness f/u. Next cpe 07/2021  Signed:  PCrissie Sickles MD  03/02/2021

## 2021-03-03 LAB — APOLIPOPROTEIN B: Apolipoprotein B: 115 mg/dL — ABNORMAL HIGH (ref <90)

## 2021-03-15 DIAGNOSIS — F4011 Social phobia, generalized: Secondary | ICD-10-CM | POA: Diagnosis not present

## 2021-03-15 DIAGNOSIS — F902 Attention-deficit hyperactivity disorder, combined type: Secondary | ICD-10-CM | POA: Diagnosis not present

## 2021-03-15 DIAGNOSIS — F3132 Bipolar disorder, current episode depressed, moderate: Secondary | ICD-10-CM | POA: Diagnosis not present

## 2021-03-27 ENCOUNTER — Ambulatory Visit: Payer: Medicare HMO | Admitting: Pulmonary Disease

## 2021-03-27 ENCOUNTER — Other Ambulatory Visit: Payer: Self-pay

## 2021-03-27 ENCOUNTER — Encounter: Payer: Self-pay | Admitting: Pulmonary Disease

## 2021-03-27 VITALS — BP 132/80 | HR 90 | Temp 98.6°F | Ht 70.0 in | Wt 260.4 lb

## 2021-03-27 DIAGNOSIS — G4730 Sleep apnea, unspecified: Secondary | ICD-10-CM

## 2021-03-27 DIAGNOSIS — G4733 Obstructive sleep apnea (adult) (pediatric): Secondary | ICD-10-CM

## 2021-03-27 DIAGNOSIS — E669 Obesity, unspecified: Secondary | ICD-10-CM | POA: Diagnosis not present

## 2021-03-27 DIAGNOSIS — G4726 Circadian rhythm sleep disorder, shift work type: Secondary | ICD-10-CM | POA: Diagnosis not present

## 2021-03-27 NOTE — Patient Instructions (Signed)
Follow up in 1 year.

## 2021-03-27 NOTE — Progress Notes (Signed)
[facility]  Chief Complaint  Patient presents with   Follow-up    CPAP working well.  Patient feels he is not getting enough sleep.     Constitutional:  BP 132/80 (BP Location: Left Arm, Patient Position: Sitting)    Pulse 90    Temp 98.6 F (37 C) (Temporal)    Ht 5\' 10"  (1.778 m)    Wt 260 lb 6.4 oz (118.1 kg)    SpO2 97% Comment: ra   BMI 37.36 kg/m   Past Medical History:  ADHD, Anxiety, Bipolar, Neck pain, GERD, Fatty liver, Headache, HTN, IBS, HLD, Vertigo, Tremor  Past Surgical History:  He  has a past surgical history that includes Wisdom tooth extraction (1997); Knee surgery (Right, 1997); Cholecystectomy (N/A, 12/26/2016); polysomnogram (09/2016); Carpal tunnel release (Left); and Carpal tunnel release (Right).  Brief Summary:  Bobby Jimenez is a 41 y.o. male former smoker with obstructive sleep apnea.       Subjective:   He uses CPAP nightly.  Has full face mask.  Gets mask leak due to his beard. This isn't causing sleep disruption.  Pressure is comfortable.  Not having sinus congestion, dry mouth.    Has variable work schedule during the week.  Makes it difficult to keep up with regular sleep schedule.  He is paying more attention to his diet and exercising more.  He reports losing 20 lbs over the past several months.  Physical Exam:   Appearance - well kempt   ENMT - no sinus tenderness, no oral exudate, no LAN, Mallampati 4 airway, no stridor  Respiratory - equal breath sounds bilaterally, no wheezing or rales  CV - s1s2 regular rate and rhythm, no murmurs  Ext - no clubbing, no edema  Skin - no rashes  Psych - normal mood and affect    Sleep Tests:  PSG 10/16/16 >> AHI 13.1, SpO2 low 88% PSG 10/30/17 >> AHI 32.1, SpO2 low 88%; CPAP 8 cm H2O CPAP 02/22/21 to 03/23/21 >> used on 24 of 30 nights with average 7 hrs 22 min.  Average AHI 1.4 with CPAP 12 cm H2O  Social History:  He  reports that he has  quit smoking. His smoking use included cigarettes. He has a 10.00 pack-year smoking history. His smokeless tobacco use includes snuff. He reports current alcohol use of about 1.0 standard drink per week. He reports that he does not use drugs.  Family History:  His family history includes High blood pressure in his mother; Seizures in his mother. He was adopted.     Assessment/Plan:   Obstructive sleep apnea. - he is compliant with CPAP and reports benefit from therapy - he uses 03/25/21 for his DME - continue CPAP 12 cm H2O  Obesity. - encouraged him to keep up with his weight loss efforts - discussed how weight loss can improve his sleep and sleep apnea  Shift work schedule affecting sleep. - discussed importance of maintaining a regular sleep-wake schedule - proper sleep hygiene reviewed  Time Spent Involved in Patient Care on Day of Examination:  26 minutes  Follow up:   Patient Instructions  Follow up in 1 year  Medication List:   Allergies as of 03/27/2021       Reactions   Pineapple Shortness Of Breath, Swelling   Latex Swelling   Morphine And Related Hives, Itching   Statins    "locks urinary tract up"  Medication List        Accurate as of March 27, 2021 10:09 AM. If you have any questions, ask your nurse or doctor.          Abilify 10 MG tablet Generic drug: ARIPiprazole Take 5 mg by mouth 2 (two) times daily.   amantadine 100 MG capsule Commonly known as: SYMMETREL Take 1 capsule (100 mg total) by mouth 2 (two) times daily.   clonazePAM 0.5 MG tablet Commonly known as: KLONOPIN Take 0.25-0.5 mg by mouth 2 (two) times daily. Takes 1/2 tablet in am and 1 tablet at bedtime   dicyclomine 10 MG capsule Commonly known as: BENTYL TAKE 1 CAPSULE BY MOUTH 3 TIMES DAILY WITH MEALS AND AT BEDTIME AS NEEDED. HOLD FOR CONSTIPATION.   fexofenadine 180 MG tablet Commonly known as: ALLEGRA Take 1 tablet (180 mg total) by mouth  daily.   fluticasone 50 MCG/ACT nasal spray Commonly known as: FLONASE INSTILL 2 SPRAYS INTO EACH NOSTRIL DAILY.   gemfibrozil 600 MG tablet Commonly known as: LOPID Take 1 tablet (600 mg total) by mouth 2 (two) times daily before a meal. <PLEASE MAKE APPOINTMENT FOR REFILLS>   icosapent Ethyl 1 g capsule Commonly known as: VASCEPA Take 2 capsules (2 g total) by mouth 2 (two) times daily. <PLEASE MAKE APPOINTMENT FOR REFILLS>   lisinopril 10 MG tablet Commonly known as: ZESTRIL Take 1 tablet (10 mg total) by mouth daily.   metoprolol tartrate 25 MG tablet Commonly known as: LOPRESSOR Take 1 tablet (25 mg total) by mouth 2 (two) times daily.   multivitamin tablet Take 1 tablet by mouth daily.   pantoprazole 40 MG tablet Commonly known as: PROTONIX Take 1 tablet (40 mg total) by mouth daily before breakfast.   pioglitazone 15 MG tablet Commonly known as: ACTOS Take 1 tablet (15 mg total) by mouth daily.   Zoloft 100 MG tablet Generic drug: sertraline Take 200 mg by mouth daily.        Signature:  Coralyn Helling, MD [facility] Pager - [phone] 03/27/2021, 10:09 AM

## 2021-04-15 ENCOUNTER — Other Ambulatory Visit: Payer: Self-pay | Admitting: Internal Medicine

## 2021-05-11 DIAGNOSIS — H6122 Impacted cerumen, left ear: Secondary | ICD-10-CM | POA: Diagnosis not present

## 2021-05-11 DIAGNOSIS — H6982 Other specified disorders of Eustachian tube, left ear: Secondary | ICD-10-CM | POA: Diagnosis not present

## 2021-05-11 DIAGNOSIS — H7202 Central perforation of tympanic membrane, left ear: Secondary | ICD-10-CM | POA: Diagnosis not present

## 2021-05-15 ENCOUNTER — Encounter: Payer: Self-pay | Admitting: Internal Medicine

## 2021-05-31 ENCOUNTER — Other Ambulatory Visit: Payer: Self-pay | Admitting: Internal Medicine

## 2021-06-12 DIAGNOSIS — F3132 Bipolar disorder, current episode depressed, moderate: Secondary | ICD-10-CM | POA: Diagnosis not present

## 2021-06-12 DIAGNOSIS — F902 Attention-deficit hyperactivity disorder, combined type: Secondary | ICD-10-CM | POA: Diagnosis not present

## 2021-06-12 DIAGNOSIS — F4011 Social phobia, generalized: Secondary | ICD-10-CM | POA: Diagnosis not present

## 2021-06-28 ENCOUNTER — Other Ambulatory Visit: Payer: Self-pay | Admitting: Family Medicine

## 2021-06-28 ENCOUNTER — Other Ambulatory Visit: Payer: Self-pay | Admitting: Internal Medicine

## 2021-07-06 DIAGNOSIS — M25561 Pain in right knee: Secondary | ICD-10-CM | POA: Diagnosis not present

## 2021-07-13 ENCOUNTER — Ambulatory Visit: Payer: Medicare HMO | Admitting: Family Medicine

## 2021-07-13 NOTE — Progress Notes (Deleted)
OFFICE VISIT  07/13/2021  CC: No chief complaint on file.   Patient is a 41 y.o. male who presents for "cyst in armpit".  HPI: ***   Past Medical History:  Diagnosis Date   ADHD (attention deficit hyperactivity disorder)    Anxiety    Bilateral carpal tunnel syndrome    transient relief from inj by ortho 04/2020; NCS confirmed bilat CTS->surgery planned as of 05/2020 ortho f/u   Bipolar disorder (HCC)    Cervical radiculopathy    Diabetes mellitus (HCC) 08/2020   Aug 2022->fasting gluc 130, Hba1c 7.6%   Dizziness    lightheaded   Food allergy    pineapple   GERD (gastroesophageal reflux disease)    Headache syndrome    Hepatic steatosis    u/s and CT confirmed 2020--no cirrhosis.   Hypertension    Hypertriglyceridemia    Very high (>800 while on vascepa and rosuva 06/2019)->rosuva and vascepa d/c'd and gemfibrozil started, also referred pt to advanced lipid clinic   IBS (irritable bowel syndrome)    D.  bentyl helping ([facility])   Latex allergy    Low back pain    Mixed hyperlipidemia    OSA on CPAP 10/16/2016   Tremors of nervous system    arms, dx'd as lasting side effect from lithium use in remote past: psych has him on amantadine for this.   Vertigo     Past Surgical History:  Procedure Laterality Date   CARPAL TUNNEL RELEASE Left    L 05/2020.  Plan for R side to be done.   CARPAL TUNNEL RELEASE Right    07/2020   CHOLECYSTECTOMY N/A 12/26/2016   Procedure: LAPAROSCOPIC CHOLECYSTECTOMY;  Surgeon: Franky Macho, MD;  Location: [facility];  Service: General;  Laterality: N/A;   KNEE SURGERY Right 1997   arthroscopic   polysomnogram  09/2016   Mild/mod OSA->CPAP (Dr. Lestine Mount)   WISDOM TOOTH EXTRACTION  1997    Outpatient Medications Prior to Visit  Medication Sig Dispense Refill   ABILIFY 10 MG tablet Take 5 mg by mouth 2 (two) times daily.      amantadine (SYMMETREL) 100 MG capsule Take 1 capsule (100 mg total) by mouth 2 (two) times daily. 60  capsule 0   clonazePAM (KLONOPIN) 0.5 MG tablet Take 0.25-0.5 mg by mouth 2 (two) times daily. Takes 1/2 tablet in am and 1 tablet at bedtime     dicyclomine (BENTYL) 10 MG capsule TAKE 1 CAPSULE BY MOUTH 3 TIMES DAILY WITH MEALS AND AT BEDTIME AS NEEDED. HOLD FOR CONSTIPATION. 120 capsule 5   fexofenadine (ALLEGRA) 180 MG tablet Take 1 tablet (180 mg total) by mouth daily. 90 tablet 1   fluticasone (FLONASE) 50 MCG/ACT nasal spray INSTILL 2 SPRAYS INTO EACH NOSTRIL DAILY. 16 g 11   gemfibrozil (LOPID) 600 MG tablet TAKE 1 TABLET 2 TIMES DAILY BEFORE A MEAL (NEED MD APPOINTMENT FOR REFILLS) 180 tablet 0   icosapent Ethyl (VASCEPA) 1 g capsule Take 2 capsules (2 g total) by mouth 2 (two) times daily. Must keep upcoming cardiology appointment for further refills 240 capsule 0   lisinopril (ZESTRIL) 10 MG tablet TAKE 1 TABLET EVERY DAY 90 tablet 0   metoprolol tartrate (LOPRESSOR) 25 MG tablet TAKE 1 TABLET TWICE DAILY 180 tablet 0   Multiple Vitamin (MULTIVITAMIN) tablet Take 1 tablet by mouth daily.     pantoprazole (PROTONIX) 40 MG tablet Take 1 tablet (40 mg total) by mouth daily before breakfast. 90 tablet 3  pioglitazone (ACTOS) 15 MG tablet TAKE 1 TABLET EVERY DAY 90 tablet 0   ZOLOFT 100 MG tablet Take 200 mg by mouth daily.     No facility-administered medications prior to visit.    Allergies  Allergen Reactions   Pineapple Shortness Of Breath and Swelling   Latex Swelling   Morphine And Related Hives and Itching   Statins     "locks urinary tract up"    ROS As per HPI  PE:    03/27/2021    9:48 AM 03/02/2021   10:13 AM 01/26/2021    4:00 AM  Vitals with BMI  Height 5\' 10"  5\' 10"    Weight 260 lbs 6 oz 258 lbs 13 oz   BMI 37.36 37.13   Systolic 132 114  Diastolic 80 73 58  Pulse 90 74 93     Physical Exam  ***  LABS:  Last CBC Lab Results  Component Value Date   WBC 6.8 01/25/2021   HGB 13.0 01/25/2021   HCT 36.3 (L) 01/25/2021   MCV 92.1 01/25/2021    MCH 33.0 01/25/2021   RDW 13.9 01/25/2021   PLT 235 01/25/2021   Last metabolic panel Lab Results  Component Value Date   GLUCOSE 182 (H) 01/25/2021   NA 137 01/25/2021   K 3.7 01/25/2021   CL 105 01/25/2021   CO2 23 01/25/2021   BUN 21 (H) 01/25/2021   CREATININE 1.01 01/25/2021   GFRNONAA >60 01/25/2021   CALCIUM 9.1 01/25/2021   PROT 7.5 01/25/2021   ALBUMIN 4.5 01/25/2021   BILITOT 0.1 (L) 01/25/2021   ALKPHOS 45 01/25/2021   AST 26 01/25/2021   ALT 33 01/25/2021   ANIONGAP 9 01/25/2021   IMPRESSION AND PLAN:  No problem-specific Assessment & Plan notes found for this encounter.   An After Visit Summary was printed and given to the patient.  FOLLOW UP: No follow-ups on file.  Signed:  01/27/2021, MD           07/13/2021

## 2021-07-17 ENCOUNTER — Encounter: Payer: Self-pay | Admitting: Family Medicine

## 2021-07-17 ENCOUNTER — Ambulatory Visit (INDEPENDENT_AMBULATORY_CARE_PROVIDER_SITE_OTHER): Payer: Medicare HMO | Admitting: Family Medicine

## 2021-07-17 VITALS — BP 110/68 | HR 79 | Temp 98.1°F | Ht 70.0 in | Wt 259.6 lb

## 2021-07-17 DIAGNOSIS — M79621 Pain in right upper arm: Secondary | ICD-10-CM | POA: Diagnosis not present

## 2021-07-17 DIAGNOSIS — L918 Other hypertrophic disorders of the skin: Secondary | ICD-10-CM | POA: Diagnosis not present

## 2021-07-17 MED ORDER — MUPIROCIN 2 % EX OINT: 1.0000 | TOPICAL_OINTMENT | Freq: Three times a day (TID) | CUTANEOUS | 0 refills | Status: DC

## 2021-07-17 NOTE — Progress Notes (Signed)
OFFICE VISIT  07/17/2021  CC:  Chief Complaint  Patient presents with   Cyst    Under right arm; present for 2 weeks. Has not applied anything. Tried squeezing once but nothing came out.     Patient is a 41 y.o. male who presents for "cyst R armpit"  HPI: Patient noted a small area of pain in the right armpit approximately 2 weeks ago.  Is not growing but still feels some pain in the area.  He tried squeezing the area to pop it but nothing happened.  He denies fever or malaise. No similar area anywhere else in the body  Past Medical History:  Diagnosis Date   ADHD (attention deficit hyperactivity disorder)    Anxiety    Bilateral carpal tunnel syndrome    transient relief from inj by ortho 04/2020; NCS confirmed bilat CTS->surgery planned as of 05/2020 ortho f/u   Bipolar disorder (HCC)    Cervical radiculopathy    Diabetes mellitus (HCC) 08/2020   Aug 2022->fasting gluc 130, Hba1c 7.6%   Dizziness    lightheaded   Food allergy    pineapple   GERD (gastroesophageal reflux disease)    Headache syndrome    Hepatic steatosis    u/s and CT confirmed 2020--no cirrhosis.   Hypertension    Hypertriglyceridemia    Very high (>800 while on vascepa and rosuva 06/2019)->rosuva and vascepa d/c'd and gemfibrozil started, also referred pt to advanced lipid clinic   IBS (irritable bowel syndrome)    D.  bentyl helping ([facility])   Latex allergy    Low back pain    Mixed hyperlipidemia    OSA on CPAP 10/16/2016   Tremors of nervous system    arms, dx'd as lasting side effect from lithium use in remote past: psych has him on amantadine for this.   Vertigo     Past Surgical History:  Procedure Laterality Date   CARPAL TUNNEL RELEASE Left    L 05/2020.  Plan for R side to be done.   CARPAL TUNNEL RELEASE Right    07/2020   CHOLECYSTECTOMY N/A 12/26/2016   Procedure: LAPAROSCOPIC CHOLECYSTECTOMY;  Surgeon: Franky Macho, MD;  Location: [facility];  Service: General;  Laterality:  N/A;   KNEE SURGERY Right 1997   arthroscopic   polysomnogram  09/2016   Mild/mod OSA->CPAP (Dr. Lestine Mount)   WISDOM TOOTH EXTRACTION  1997    Outpatient Medications Prior to Visit  Medication Sig Dispense Refill   ABILIFY 10 MG tablet Take 5 mg by mouth 2 (two) times daily.      amantadine (SYMMETREL) 100 MG capsule Take 1 capsule (100 mg total) by mouth 2 (two) times daily. 60 capsule 0   clonazePAM (KLONOPIN) 0.5 MG tablet Take 0.25-0.5 mg by mouth 2 (two) times daily. Takes 1/2 tablet in am and 1 tablet at bedtime     dicyclomine (BENTYL) 10 MG capsule TAKE 1 CAPSULE BY MOUTH 3 TIMES DAILY WITH MEALS AND AT BEDTIME AS NEEDED. HOLD FOR CONSTIPATION. 120 capsule 5   fluticasone (FLONASE) 50 MCG/ACT nasal spray INSTILL 2 SPRAYS INTO EACH NOSTRIL DAILY. 16 g 11   gemfibrozil (LOPID) 600 MG tablet TAKE 1 TABLET 2 TIMES DAILY BEFORE A MEAL (NEED MD APPOINTMENT FOR REFILLS) 180 tablet 0   icosapent Ethyl (VASCEPA) 1 g capsule Take 2 capsules (2 g total) by mouth 2 (two) times daily. Must keep upcoming cardiology appointment for further refills 240 capsule 0   lisinopril (ZESTRIL) 10 MG  tablet TAKE 1 TABLET EVERY DAY 90 tablet 0   metoprolol tartrate (LOPRESSOR) 25 MG tablet TAKE 1 TABLET TWICE DAILY 180 tablet 0   Multiple Vitamin (MULTIVITAMIN) tablet Take 1 tablet by mouth daily.     pantoprazole (PROTONIX) 40 MG tablet Take 1 tablet (40 mg total) by mouth daily before breakfast. 90 tablet 3   pioglitazone (ACTOS) 15 MG tablet TAKE 1 TABLET EVERY DAY 90 tablet 0   ZOLOFT 100 MG tablet Take 200 mg by mouth daily.     fexofenadine (ALLEGRA) 180 MG tablet Take 1 tablet (180 mg total) by mouth daily. 90 tablet 1   No facility-administered medications prior to visit.    Allergies  Allergen Reactions   Pineapple Shortness Of Breath and Swelling   Latex Swelling   Morphine And Related Hives and Itching   Statins     "locks urinary tract up"    ROS As per HPI  PE:    07/17/2021    10:40 AM 03/27/2021    9:48 AM 03/02/2021   10:13 AM  Vitals with BMI  Height 5\' 10"  5\' 10"  5\' 10"   Weight 259 lbs 10 oz 260 lbs 6 oz 258 lbs 13 oz  BMI 37.25 37.36 37.13  Systolic 110 132  Diastolic 68 80 73  Pulse 79 90 74     Physical Exam  General: Alert and well-appearing  Right axilla with 2 small irritated skin tags.  No surrounding erythema.  No subcutaneous nodule. No induration.  It is mildly tender to touch.  LABS:  Last CBC Lab Results  Component Value Date   WBC 6.8 01/25/2021   HGB 13.0 01/25/2021   HCT 36.3 (L) 01/25/2021   MCV 92.1 01/25/2021   MCH 33.0 01/25/2021   RDW 13.9 01/25/2021   PLT 235 01/25/2021   Last metabolic panel Lab Results  Component Value Date   GLUCOSE 182 (H) 01/25/2021   NA 137 01/25/2021   K 3.7 01/25/2021   CL 105 01/25/2021   CO2 23 01/25/2021   BUN 21 (H) 01/25/2021   CREATININE 1.01 01/25/2021   GFRNONAA >60 01/25/2021   CALCIUM 9.1 01/25/2021   PROT 7.5 01/25/2021   ALBUMIN 4.5 01/25/2021   BILITOT 0.1 (L) 01/25/2021   ALKPHOS 45 01/25/2021   AST 26 01/25/2021   ALT 33 01/25/2021   ANIONGAP 9 01/25/2021   Last hemoglobin A1c Lab Results  Component Value Date   HGBA1C 5.9 03/02/2021   IMPRESSION AND PLAN:  Irritated acrochordon x2, right axilla. We will do a course of Bactroban ointment 3 times daily x10 days. Allow this location to get plenty of air/avoid excessive moisture and heat Signs/symptoms to call or return for were reviewed and pt expressed understanding.  He is due for f/u rci appt.  An After Visit Summary was printed and given to the patient.  FOLLOW UP: Return in about 2 weeks (around 07/31/2021) for f/u axilla and RCI.  Signed:  01/27/2021, MD           07/17/2021

## 2021-07-24 DIAGNOSIS — F330 Major depressive disorder, recurrent, mild: Secondary | ICD-10-CM | POA: Diagnosis not present

## 2021-07-24 DIAGNOSIS — F4310 Post-traumatic stress disorder, unspecified: Secondary | ICD-10-CM | POA: Diagnosis not present

## 2021-07-27 ENCOUNTER — Encounter: Payer: Self-pay | Admitting: Family Medicine

## 2021-07-31 ENCOUNTER — Encounter: Payer: Self-pay | Admitting: Family Medicine

## 2021-07-31 ENCOUNTER — Ambulatory Visit (INDEPENDENT_AMBULATORY_CARE_PROVIDER_SITE_OTHER): Payer: Medicare HMO | Admitting: Family Medicine

## 2021-07-31 VITALS — BP 114/72 | HR 71 | Temp 97.9°F | Ht 70.0 in | Wt 259.2 lb

## 2021-07-31 DIAGNOSIS — Z0000 Encounter for general adult medical examination without abnormal findings: Secondary | ICD-10-CM | POA: Diagnosis not present

## 2021-07-31 DIAGNOSIS — E119 Type 2 diabetes mellitus without complications: Secondary | ICD-10-CM | POA: Diagnosis not present

## 2021-07-31 DIAGNOSIS — I10 Essential (primary) hypertension: Secondary | ICD-10-CM

## 2021-07-31 LAB — POCT GLYCOSYLATED HEMOGLOBIN (HGB A1C)
HbA1c POC (<> result, manual entry): 5.3 % (ref 4.0–5.6)
HbA1c, POC (controlled diabetic range): 5.3 % (ref 0.0–7.0)
HbA1c, POC (prediabetic range): 5.3 % — AB (ref 5.7–6.4)
Hemoglobin A1C: 5.3 % (ref 4.0–5.6)

## 2021-07-31 NOTE — Patient Instructions (Signed)
Apply over the counter zinc oxide ointment (A&D ointment is one option) to affected areas of armpits. Apply your deodorant before the ointment.    Health Maintenance, Male Adopting a healthy lifestyle and getting preventive care are important in promoting health and wellness. Ask your health care provider about: The right schedule for you to have regular tests and exams. Things you can do on your own to prevent diseases and keep yourself healthy. What should I know about diet, weight, and exercise? Eat a healthy diet  Eat a diet that includes plenty of vegetables, fruits, low-fat dairy products, and lean protein. Do not eat a lot of foods that are high in solid fats, added sugars, or sodium. Maintain a healthy weight Body mass index (BMI) is a measurement that can be used to identify possible weight problems. It estimates body fat based on height and weight. Your health care provider can help determine your BMI and help you achieve or maintain a healthy weight. Get regular exercise Get regular exercise. This is one of the most important things you can do for your health. Most adults should: Exercise for at least 150 minutes each week. The exercise should increase your heart rate and make you sweat (moderate-intensity exercise). Do strengthening exercises at least twice a week. This is in addition to the moderate-intensity exercise. Spend less time sitting. Even light physical activity can be beneficial. Watch cholesterol and blood lipids Have your blood tested for lipids and cholesterol at 43 years of age, then have this test every 5 years. You may need to have your cholesterol levels checked more often if: Your lipid or cholesterol levels are high. You are older than 43 years of age. You are at high risk for heart disease. What should I know about cancer screening? Many types of cancers can be detected early and may often be prevented. Depending on your health history and family history,  you may need to have cancer screening at various ages. This may include screening for: Colorectal cancer. Prostate cancer. Skin cancer. Lung cancer. What should I know about heart disease, diabetes, and high blood pressure? Blood pressure and heart disease High blood pressure causes heart disease and increases the risk of stroke. This is more likely to develop in people who have high blood pressure readings or are overweight. Talk with your health care provider about your target blood pressure readings. Have your blood pressure checked: Every 3-5 years if you are 42-73 years of age. Every year if you are 57 years old or older. If you are between the ages of 20 and 37 and are a current or former smoker, ask your health care provider if you should have a one-time screening for abdominal aortic aneurysm (AAA). Diabetes Have regular diabetes screenings. This checks your fasting blood sugar level. Have the screening done: Once every three years after age 85 if you are at a normal weight and have a low risk for diabetes. More often and at a younger age if you are overweight or have a high risk for diabetes. What should I know about preventing infection? Hepatitis B If you have a higher risk for hepatitis B, you should be screened for this virus. Talk with your health care provider to find out if you are at risk for hepatitis B infection. Hepatitis C Blood testing is recommended for: Everyone born from [birth year] through 1967. Anyone with known risk factors for hepatitis C. Sexually transmitted infections (STIs) You should be screened each year for STIs, including  gonorrhea and chlamydia, if: You are sexually active and are younger than 43 years of age. You are older than 43 years of age and your health care provider tells you that you are at risk for this type of infection. Your sexual activity has changed since you were last screened, and you are at increased risk for chlamydia or gonorrhea. Ask  your health care provider if you are at risk. Ask your health care provider about whether you are at high risk for HIV. Your health care provider may recommend a prescription medicine to help prevent HIV infection. If you choose to take medicine to prevent HIV, you should first get tested for HIV. You should then be tested every 3 months for as long as you are taking the medicine. Follow these instructions at home: Alcohol use Do not drink alcohol if your health care provider tells you not to drink. If you drink alcohol: Limit how much you have to 0-2 drinks a day. Know how much alcohol is in your drink. In the [place] one drink equals one 12 oz bottle of beer (355 mL), one 5 oz glass of wine (148 mL), or one 1 oz glass of hard liquor (44 mL). Lifestyle Do not use any products that contain nicotine or tobacco. These products include cigarettes, chewing tobacco, and vaping devices, such as e-cigarettes. If you need help quitting, ask your health care provider. Do not use street drugs. Do not share needles. Ask your health care provider for help if you need support or information about quitting drugs. General instructions Schedule regular health, dental, and eye exams. Stay current with your vaccines. Tell your health care provider if: You often feel depressed. You have ever been abused or do not feel safe at home. Summary Adopting a healthy lifestyle and getting preventive care are important in promoting health and wellness. Follow your health care provider's instructions about healthy diet, exercising, and getting tested or screened for diseases. Follow your health care provider's instructions on monitoring your cholesterol and blood pressure. This information is not intended to replace advice given to you by your health care provider. Make sure you discuss any questions you have with your health care provider. Document Revised: 06/06/2020 Document Reviewed: 06/06/2020 Elsevier Patient  Education  2023 [employer].

## 2021-07-31 NOTE — Progress Notes (Signed)
OFFICE VISIT  07/31/2021  CC:  Chief Complaint  Patient presents with   Diabetes    Point of care a1c completed today   Hypertension   Hyperlipidemia    Pt is fasting    Patient is a 41 y.o. male who presents for annual health maintenance exam and 5 mo f/u diabetes, hypertension, and hyperlipidemia. A/P as of last visit: "#1 diabetes, well controlled on pioglitazone 15 mg/day, diet and exercise improving. Hemoglobin A1c and fasting glucose today.   #2 HTN: well controlled with lopressor 25mg  bid and lisinopril 10 qd. Creatinine and electrolytes normal 1 mo ago.   #3 mixed hyperlipidemia: Followed by Dr. , last visit with him was about a year ago.   Continue Vascepa and gemfibrozil. Pt states that statins have caused urinary retention for him in the past so he declines any further trials.  Checking lipid panel and apo lipo B today. Hepatic panel normal 1 mo ago. CC results to Dr. Rennis Golden.   #4 right foot pain.  Unclear etiology but suspect extensor tendon etiology. Apply heat, relative rest, nonweightbearing range of motion exercises. Signs/symptoms to call or return for were reviewed and pt expressed understanding. NOTE: bedside u/s today of ankle/foot w/out abnormality.   #5 tremor.  He has a history of tremor due to remote use of lithium. He has had trouble getting this refilled through his psychiatrist/pharmacy.  We will give him a month supply to have until he gets this figured out with them."  INTERIM HX: Patient feels good.  No home glucose or blood pressure monitoring. He is fully compliant with medications. He eats a fairly good diet. Physical at work but no formal exercise.   Past Medical History:  Diagnosis Date   ADHD (attention deficit hyperactivity disorder)    Anxiety    Bilateral carpal tunnel syndrome    transient relief from inj by ortho 04/2020; NCS confirmed bilat CTS->surgery planned as of 05/2020 ortho f/u   Bipolar disorder (HCC)    Cervical  radiculopathy    Diabetes mellitus (HCC) 08/2020   Aug 2022->fasting gluc 130, Hba1c 7.6%   Dizziness    lightheaded   Food allergy    pineapple   GERD (gastroesophageal reflux disease)    Headache syndrome    Hepatic steatosis    u/s and CT confirmed 2020--no cirrhosis.   Hypertension    Hypertriglyceridemia    Very high (>800 while on vascepa and rosuva 06/2019)->rosuva and vascepa d/c'd and gemfibrozil started, also referred pt to advanced lipid clinic   IBS (irritable bowel syndrome)    D.  bentyl helping ([facility])   Latex allergy    Low back pain    Mixed hyperlipidemia    OSA on CPAP 10/16/2016   Tremors of nervous system    arms, dx'd as lasting side effect from lithium use in remote past: psych has him on amantadine for this.   Vertigo     Past Surgical History:  Procedure Laterality Date   CARPAL TUNNEL RELEASE Left    L 05/2020.  Plan for R side to be done.   CARPAL TUNNEL RELEASE Right    07/2020   CHOLECYSTECTOMY N/A 12/26/2016   Procedure: LAPAROSCOPIC CHOLECYSTECTOMY;  Surgeon: 12/28/2016, MD;  Location: [facility];  Service: General;  Laterality: N/A;   KNEE SURGERY Right 1997   arthroscopic   polysomnogram  09/2016   Mild/mod OSA->CPAP (Dr. 10/2016)   WISDOM TOOTH EXTRACTION  1997   Family History  Adopted:  Yes  Problem Relation Age of Onset   High blood pressure Mother    Seizures Mother    Colon cancer Neg Hx    Social History   Socioeconomic History   Marital status: Divorced    Spouse name: Not on file   Number of children: 0   Years of education: 13   Highest education level: Not on file  Occupational History   Not on file  Tobacco Use   Smoking status: Former    Packs/day: 1.00    Years: 10.00    Total pack years: 10.00    Types: Cigarettes   Smokeless tobacco: Current    Types: Snuff   Tobacco comments:    quit Nov 2018  Vaping Use   Vaping Use: Never used  Substance and Sexual Activity   Alcohol use: Yes     Alcohol/week: 1.0 standard drink of alcohol    Types: 1 Cans of beer per week    Comment: heavy in past, short period of time in 20s.; 05/24/20 occ   Drug use: No    Comment: Quit 1996   Sexual activity: Not on file  Other Topics Concern   Not on file  Social History Narrative   Divorced, no children.   Educ: college at [employer]   Patient lives at home with his father and unemployed.  Disability for psych reasons.   Education some college.   Both handed.   Caffeine three cups daily soda and coffee.   Alc: occ beer (remote hx of alc abuse, marijuana, acid--no IV drugs) in 2005-2006, surround his problems coping with GF's death.   Tobacco: former cig smoker, 20 pack-yr hx , quit 2018.   Social Determinants of Health   Financial Resource Strain: Low Risk  (02/22/2021)   Overall Financial Resource Strain (CARDIA)    Difficulty of Paying Living Expenses: Not hard at all  Food Insecurity: No Food Insecurity (02/22/2021)   Hunger Vital Sign    Worried About Running Out of Food in the Last Year: Never true    Ran Out of Food in the Last Year: Never true  Transportation Needs: No Transportation Needs (02/22/2021)   PRAPARE - Administrator, Civil ServiceTransportation    Lack of Transportation (Medical): No    Lack of Transportation (Non-Medical): No  Physical Activity: Sufficiently Active (02/22/2021)   Exercise Vital Sign    Days of Exercise per Week: 5 days    Minutes of Exercise per Session: 120 min  Stress: No Stress Concern Present (02/22/2021)   [employer] - Occupational Stress Questionnaire    Feeling of Stress : Not at all  Social Connections: Socially Isolated (02/22/2021)   Social Connection and Isolation Panel [NHANES]    Frequency of Communication with Friends and Family: Once a week    Frequency of Social Gatherings with Friends and Family: More than three times a week    Attends Religious Services: Never    Database administratorActive Member of Clubs or Organizations: No    Attends Tax inspectorClub or  Organization Meetings: Never    Marital Status: Divorced   Outpatient Medications Prior to Visit  Medication Sig Dispense Refill   ABILIFY 10 MG tablet Take 5 mg by mouth 2 (two) times daily.      amantadine (SYMMETREL) 100 MG capsule Take 1 capsule (100 mg total) by mouth 2 (two) times daily. 60 capsule 0   clonazePAM (KLONOPIN) 0.5 MG tablet Take 0.25-0.5 mg by mouth 2 (two) times daily. Takes 1/2 tablet in  am and 1 tablet at bedtime     dicyclomine (BENTYL) 10 MG capsule TAKE 1 CAPSULE BY MOUTH 3 TIMES DAILY WITH MEALS AND AT BEDTIME AS NEEDED. HOLD FOR CONSTIPATION. 120 capsule 5   fluticasone (FLONASE) 50 MCG/ACT nasal spray INSTILL 2 SPRAYS INTO EACH NOSTRIL DAILY. 16 g 11   gemfibrozil (LOPID) 600 MG tablet TAKE 1 TABLET 2 TIMES DAILY BEFORE A MEAL (NEED MD APPOINTMENT FOR REFILLS) 180 tablet 0   icosapent Ethyl (VASCEPA) 1 g capsule Take 2 capsules (2 g total) by mouth 2 (two) times daily. Must keep upcoming cardiology appointment for further refills 240 capsule 0   lisinopril (ZESTRIL) 10 MG tablet TAKE 1 TABLET EVERY DAY 90 tablet 0   metoprolol tartrate (LOPRESSOR) 25 MG tablet TAKE 1 TABLET TWICE DAILY 180 tablet 0   Multiple Vitamin (MULTIVITAMIN) tablet Take 1 tablet by mouth daily.     mupirocin ointment (BACTROBAN) 2 % Apply 1 Application topically 3 (three) times daily. 15 g 0   pantoprazole (PROTONIX) 40 MG tablet Take 1 tablet (40 mg total) by mouth daily before breakfast. 90 tablet 3   pioglitazone (ACTOS) 15 MG tablet TAKE 1 TABLET EVERY DAY 90 tablet 0   ZOLOFT 100 MG tablet Take 200 mg by mouth daily.     fexofenadine (ALLEGRA) 180 MG tablet Take 1 tablet (180 mg total) by mouth daily. 90 tablet 1   No facility-administered medications prior to visit.    Allergies  Allergen Reactions   Pineapple Shortness Of Breath and Swelling   Latex Swelling   Morphine And Related Hives and Itching   Statins     "locks urinary tract up"    Review of Systems  Constitutional:   Negative for chills and fever.  HENT:  Negative for congestion, ear pain and sore throat.   Eyes:  Negative for discharge and redness.  Respiratory:  Negative for cough, shortness of breath and wheezing.   Cardiovascular:  Negative for chest pain, palpitations and leg swelling.  Gastrointestinal:  Negative for abdominal pain, blood in stool, diarrhea, nausea and vomiting.  Genitourinary:  Negative for dysuria, flank pain, frequency, hematuria and urgency.  Musculoskeletal:  Negative for back pain and myalgias.  Skin:  Negative for rash.  Neurological:  Negative for dizziness, weakness and headaches.  Endo/Heme/Allergies:  Does not bruise/bleed easily.  Psychiatric/Behavioral:  The patient is not nervous/anxious.     PE:    07/31/2021    9:58 AM 07/17/2021   10:40 AM 03/27/2021    9:48 AM  Vitals with BMI  Height 5\' 10"  5\' 10"  5\' 10"   Weight 259 lbs 3 oz 259 lbs 10 oz 260 lbs 6 oz  BMI 37.19 [id number]  Systolic [id number]  Diastolic 72 68 80  Pulse 71 79 90   Physical Exam  Gen: Alert, well appearing.  Patient is oriented to person, place, time, and situation. AFFECT: pleasant, lucid thought and speech. ENT: Ears: EACs clear, normal epithelium.  TMs with good light reflex and landmarks bilaterally.  Eyes: no injection, icteris, swelling, or exudate.  EOMI, PERRLA. Nose: no drainage or turbinate edema/swelling.  No injection or focal lesion.  Mouth: lips without lesion/swelling.  Oral mucosa pink and moist.  Dentition intact and without obvious caries or gingival swelling.  Oropharynx without erythema, exudate, or swelling.  Neck: supple/nontender.  No LAD, mass, or TM.  Carotid pulses 2+ bilaterally, without bruits. CV: RRR, no m/r/g.   LUNGS: CTA bilat, nonlabored resps, good  aeration in all lung fields. ABD: soft, NT, ND, BS normal.  No hepatospenomegaly or mass.  No bruits. EXT: no clubbing, cyanosis, or edema.  Musculoskeletal: no joint swelling, erythema, warmth, or  tenderness.  ROM of all joints intact. Skin - no sores or suspicious lesions or rashes or color changes   LABS:  Last CBC Lab Results  Component Value Date   WBC 6.8 01/25/2021   HGB 13.0 01/25/2021   HCT 36.3 (L) 01/25/2021   MCV 92.1 01/25/2021   MCH 33.0 01/25/2021   RDW 13.9 01/25/2021   PLT 235 01/25/2021   Last metabolic panel Lab Results  Component Value Date   GLUCOSE 182 (H) 01/25/2021   NA 137 01/25/2021   K 3.7 01/25/2021   CL 105 01/25/2021   CO2 23 01/25/2021   BUN 21 (H) 01/25/2021   CREATININE 1.01 01/25/2021   GFRNONAA >60 01/25/2021   CALCIUM 9.1 01/25/2021   PROT 7.5 01/25/2021   ALBUMIN 4.5 01/25/2021   BILITOT 0.1 (L) 01/25/2021   ALKPHOS 45 01/25/2021   AST 26 01/25/2021   ALT 33 01/25/2021   ANIONGAP 9 01/25/2021   Last lipids Lab Results  Component Value Date   CHOL 186 03/02/2021   HDL 35.90 (L) 03/02/2021   LDLCALC 82 01/18/2020   LDLDIRECT 119.0 03/02/2021   TRIG 289.0 (H) 03/02/2021   CHOLHDL 5 03/02/2021   Last hemoglobin A1c Lab Results  Component Value Date   HGBA1C 5.9 03/02/2021   Last thyroid functions Lab Results  Component Value Date   TSH 3.77 12/01/2020   POC HbA1c today is 5.3%  IMPRESSION AND PLAN:  #1 type 2 diabetes.  Very well controlled. We will go ahead and get off Actos and see how things go. Complete metabolic panel today.  #2 hypertension, well controlled on lisinopril 10 mg a day and Lopressor 25 mg twice daily. Electrolytes and creatinine today.  #3 hypertriglyceridemia.  Patient is on gemfibrozil 600 mg twice daily and Vascepa 2 g twice daily. He has made appropriate follow-up appointment with Dr. Rennis Golden. Hepatic panel today.  #4 Health maintenance exam: Reviewed age and gender appropriate health maintenance issues (prudent diet, regular exercise, health risks of tobacco and excessive alcohol, use of seatbelts, fire alarms in home, use of sunscreen).  Also reviewed age and gender appropriate  health screening as well as vaccine recommendations. Vaccines: ALL UTD Labs: cmet Prostate ca screening:  average risk patient= as per latest guidelines, start screening at 42 yrs of age. Colon ca screening: average risk patient= as per latest guidelines, start screening at age 13.  An After Visit Summary was printed and given to the patient.  FOLLOW UP: No follow-ups on file.  Signed:  Santiago Bumpers, MD           07/31/2021

## 2021-07-31 NOTE — Addendum Note (Signed)
Addended by: Renaldo Reel S on: 07/31/2021 12:32 PM   Modules accepted: Orders

## 2021-08-01 LAB — COMPREHENSIVE METABOLIC PANEL
AG Ratio: 1.8 (calc) (ref 1.0–2.5)
ALT: 19 U/L (ref 9–46)
AST: 14 U/L (ref 10–40)
Albumin: 4.7 g/dL (ref 3.6–5.1)
Alkaline phosphatase (APISO): 48 U/L (ref 36–130)
BUN/Creatinine Ratio: 35 (calc) — ABNORMAL HIGH (ref 6–22)
BUN: 29 mg/dL — ABNORMAL HIGH (ref 7–25)
CO2: 21 mmol/L (ref 20–32)
Calcium: 9.6 mg/dL (ref 8.6–10.3)
Chloride: 106 mmol/L (ref 98–110)
Creat: 0.84 mg/dL (ref 0.60–1.29)
Globulin: 2.6 g/dL (calc) (ref 1.9–3.7)
Glucose, Bld: 99 mg/dL (ref 65–99)
Potassium: 4.3 mmol/L (ref 3.5–5.3)
Sodium: 138 mmol/L (ref 135–146)
Total Bilirubin: 0.3 mg/dL (ref 0.2–1.2)
Total Protein: 7.3 g/dL (ref 6.1–8.1)

## 2021-08-02 ENCOUNTER — Other Ambulatory Visit: Payer: Self-pay | Admitting: Internal Medicine

## 2021-08-02 DIAGNOSIS — E781 Pure hyperglyceridemia: Secondary | ICD-10-CM

## 2021-08-04 DIAGNOSIS — E781 Pure hyperglyceridemia: Secondary | ICD-10-CM | POA: Diagnosis not present

## 2021-08-05 LAB — LIPID PANEL
Chol/HDL Ratio: 5.7 ratio — ABNORMAL HIGH (ref 0.0–5.0)
Cholesterol, Total: 195 mg/dL (ref 100–199)
HDL: 34 mg/dL — ABNORMAL LOW (ref >39)
LDL Chol Calc (NIH): 105 mg/dL — ABNORMAL HIGH (ref 0–99)
Triglycerides: 326 mg/dL — ABNORMAL HIGH (ref 0–149)
VLDL Cholesterol Cal: 56 mg/dL — ABNORMAL HIGH (ref 5–40)

## 2021-08-05 LAB — LDL CHOLESTEROL, DIRECT: LDL Direct: 116 mg/dL — ABNORMAL HIGH (ref 0–99)

## 2021-08-07 ENCOUNTER — Other Ambulatory Visit: Payer: Self-pay | Admitting: Internal Medicine

## 2021-08-07 MED ORDER — ICOSAPENT ETHYL 1 G PO CAPS: 2.0000 g | ORAL_CAPSULE | Freq: Two times a day (BID) | ORAL | 1 refills | Status: DC

## 2021-08-07 MED ORDER — GEMFIBROZIL 600 MG PO TABS: ORAL_TABLET | ORAL | 1 refills | Status: DC

## 2021-08-17 ENCOUNTER — Ambulatory Visit: Payer: Medicare HMO | Admitting: Internal Medicine

## 2021-08-21 DIAGNOSIS — F330 Major depressive disorder, recurrent, mild: Secondary | ICD-10-CM | POA: Diagnosis not present

## 2021-08-21 DIAGNOSIS — F4310 Post-traumatic stress disorder, unspecified: Secondary | ICD-10-CM | POA: Diagnosis not present

## 2021-08-27 NOTE — Progress Notes (Unsigned)
Referring Provider: Jeoffrey Massed, MD Primary Care Physician:  Jeoffrey Massed, MD Primary GI Physician: Dr. Jena Gauss  No chief complaint on file.   HPI:   Bobby Jimenez is a 41 y.o. male with history of IBS, GERD, fatty liver with some concern for coarsened echotexture in Sept 2020 but CT in Oct 2020 and December 2022 without stigmata of advanced liver disease. Spleen normal. No thrombocytopenia. He is presenting today for routine follow-up.   Last seen in our office 05/24/2020.  GERD is well controlled with Protonix daily.  IBS symptoms at baseline.  Occasional looser stools using dicyclomine once or twice a week at the most.  No alarm symptoms.  No other significant GI symptoms and no stigmata of advanced liver disease.  Recommended continuing current medications, continue to follow LFTs yearly, and discussed diet/exercise/behavior modification due to fatty liver, follow-up in 1 year.  Today:    LFTs normal in July 2023.  Platelets wnl December 2022.    Past Medical History:  Diagnosis Date   ADHD (attention deficit hyperactivity disorder)    Anxiety    Bilateral carpal tunnel syndrome    transient relief from inj by ortho 04/2020; NCS confirmed bilat CTS->surgery planned as of 05/2020 ortho f/u   Bipolar disorder (HCC)    Cervical radiculopathy    Diabetes mellitus (HCC) 08/2020   Aug 2022->fasting gluc 130, Hba1c 7.6%   Dizziness    lightheaded   Food allergy    pineapple   GERD (gastroesophageal reflux disease)    Headache syndrome    Hepatic steatosis    u/s and CT confirmed 2020--no cirrhosis.   Hypertension    Hypertriglyceridemia    Very high (>800 while on vascepa and rosuva 06/2019)->rosuva and vascepa d/c'd and gemfibrozil started, also referred pt to advanced lipid clinic   IBS (irritable bowel syndrome)    D.  bentyl helping ([facility])   Latex allergy    Low back pain    Mixed hyperlipidemia    OSA on CPAP 10/16/2016   Tremors of  nervous system    arms, dx'd as lasting side effect from lithium use in remote past: psych has him on amantadine for this.   Vertigo     Past Surgical History:  Procedure Laterality Date   CARPAL TUNNEL RELEASE Left    L 05/2020.  Plan for R side to be done.   CARPAL TUNNEL RELEASE Right    07/2020   CHOLECYSTECTOMY N/A 12/26/2016   Procedure: LAPAROSCOPIC CHOLECYSTECTOMY;  Surgeon: Franky Macho, MD;  Location: [facility];  Service: General;  Laterality: N/A;   KNEE SURGERY Right 1997   arthroscopic   polysomnogram  09/2016   Mild/mod OSA->CPAP (Dr. Lestine Mount)   WISDOM TOOTH EXTRACTION  1997    Current Outpatient Medications  Medication Sig Dispense Refill   ABILIFY 10 MG tablet Take 5 mg by mouth 2 (two) times daily.      amantadine (SYMMETREL) 100 MG capsule Take 1 capsule (100 mg total) by mouth 2 (two) times daily. 60 capsule 0   clonazePAM (KLONOPIN) 0.5 MG tablet Take 0.25-0.5 mg by mouth 2 (two) times daily. Takes 1/2 tablet in am and 1 tablet at bedtime     dicyclomine (BENTYL) 10 MG capsule TAKE 1 CAPSULE BY MOUTH 3 TIMES DAILY WITH MEALS AND AT BEDTIME AS NEEDED. HOLD FOR CONSTIPATION. 120 capsule 5   fexofenadine (ALLEGRA) 180 MG tablet Take 1 tablet (180 mg total) by mouth daily. 90  tablet 1   fluticasone (FLONASE) 50 MCG/ACT nasal spray INSTILL 2 SPRAYS INTO EACH NOSTRIL DAILY. 16 g 11   gemfibrozil (LOPID) 600 MG tablet TAKE 1 TABLET 2 TIMES DAILY BEFORE A MEAL 180 tablet 1   icosapent Ethyl (VASCEPA) 1 g capsule Take 2 capsules (2 g total) by mouth 2 (two) times daily. 360 capsule 1   lisinopril (ZESTRIL) 10 MG tablet TAKE 1 TABLET EVERY DAY 90 tablet 0   metoprolol tartrate (LOPRESSOR) 25 MG tablet TAKE 1 TABLET TWICE DAILY 180 tablet 0   Multiple Vitamin (MULTIVITAMIN) tablet Take 1 tablet by mouth daily.     mupirocin ointment (BACTROBAN) 2 % Apply 1 Application topically 3 (three) times daily. 15 g 0   pantoprazole (PROTONIX) 40 MG tablet Take 1 tablet (40 mg total) by  mouth daily before breakfast. 90 tablet 3   ZOLOFT 100 MG tablet Take 200 mg by mouth daily.     No current facility-administered medications for this visit.    Allergies as of 08/28/2021 - Review Complete 07/31/2021  Allergen Reaction Noted   Pineapple Shortness Of Breath and Swelling 05/14/2014   Latex Swelling 05/14/2014   Morphine and related Hives and Itching 05/14/2014   Statins  12/30/2018    Family History  Adopted: Yes  Problem Relation Age of Onset   High blood pressure Mother    Seizures Mother    Colon cancer Neg Hx     Social History   Socioeconomic History   Marital status: Divorced    Spouse name: Not on file   Number of children: 0   Years of education: 13   Highest education level: Not on file  Occupational History   Not on file  Tobacco Use   Smoking status: Former    Packs/day: 1.00    Years: 10.00    Total pack years: 10.00    Types: Cigarettes   Smokeless tobacco: Current    Types: Snuff   Tobacco comments:    quit Nov 2018  Vaping Use   Vaping Use: Never used  Substance and Sexual Activity   Alcohol use: Yes    Alcohol/week: 1.0 standard drink of alcohol    Types: 1 Cans of beer per week    Comment: heavy in past, short period of time in 20s.; 05/24/20 occ   Drug use: No    Comment: Quit 1996   Sexual activity: Not on file  Other Topics Concern   Not on file  Social History Narrative   Divorced, no children.   Educ: college at [employer]   Patient lives at home with his father and unemployed.  Disability for psych reasons.   Education some college.   Both handed.   Caffeine three cups daily soda and coffee.   Alc: occ beer (remote hx of alc abuse, marijuana, acid--no IV drugs) in 2005-2006, surround his problems coping with GF's death.   Tobacco: former cig smoker, 20 pack-yr hx , quit 2018.   Social Determinants of Health   Financial Resource Strain: Low Risk  (02/22/2021)   Overall Financial Resource Strain (CARDIA)    Difficulty of  Paying Living Expenses: Not hard at all  Food Insecurity: No Food Insecurity (02/22/2021)   Hunger Vital Sign    Worried About Running Out of Food in the Last Year: Never true    Ran Out of Food in the Last Year: Never true  Transportation Needs: No Transportation Needs (02/22/2021)   PRAPARE - Transportation  Lack of Transportation (Medical): No    Lack of Transportation (Non-Medical): No  Physical Activity: Sufficiently Active (02/22/2021)   Exercise Vital Sign    Days of Exercise per Week: 5 days    Minutes of Exercise per Session: 120 min  Stress: No Stress Concern Present (02/22/2021)   [employer] of Occupational Health - Occupational Stress Questionnaire    Feeling of Stress : Not at all  Social Connections: Socially Isolated (02/22/2021)   Social Connection and Isolation Panel [NHANES]    Frequency of Communication with Friends and Family: Once a week    Frequency of Social Gatherings with Friends and Family: More than three times a week    Attends Religious Services: Never    Database administrator or Organizations: No    Attends Engineer, structural: Never    Marital Status: Divorced    Review of Systems: Gen: Denies fever, chills, cold or flu like symptoms, pre-syncope, or syncope. CV: Denies chest pain, palpitations. Resp: Denies dyspnea, cough.  GI: See HPI Heme: See HPI  Physical Exam: There were no vitals taken for this visit. General:   Alert and oriented. No distress noted. Pleasant and cooperative.  Head:  Normocephalic and atraumatic. Eyes:  Conjuctiva clear without scleral icterus. Heart:  S1, S2 present without murmurs appreciated. Lungs:  Clear to auscultation bilaterally. No wheezes, rales, or rhonchi. No distress.  Abdomen:  +BS, soft, non-tender and non-distended. No rebound or guarding. No HSM or masses noted. Msk:  Symmetrical without gross deformities. Normal posture. Extremities:  Without edema. Neurologic:  Alert and  oriented  x4 Psych:  Normal mood and affect.    Assessment:     Plan:  ***   Ermalinda Memos, PA-C [facility] 08/28/2021

## 2021-08-28 ENCOUNTER — Encounter: Payer: Self-pay | Admitting: Gastroenterology

## 2021-08-28 ENCOUNTER — Ambulatory Visit (INDEPENDENT_AMBULATORY_CARE_PROVIDER_SITE_OTHER): Payer: Medicare HMO | Admitting: Gastroenterology

## 2021-08-28 VITALS — BP 115/73 | HR 80 | Temp 97.7°F | Ht 70.0 in | Wt 261.8 lb

## 2021-08-28 DIAGNOSIS — K580 Irritable bowel syndrome with diarrhea: Secondary | ICD-10-CM | POA: Diagnosis not present

## 2021-08-28 DIAGNOSIS — K219 Gastro-esophageal reflux disease without esophagitis: Secondary | ICD-10-CM

## 2021-08-28 DIAGNOSIS — R194 Change in bowel habit: Secondary | ICD-10-CM | POA: Diagnosis not present

## 2021-08-28 DIAGNOSIS — K625 Hemorrhage of anus and rectum: Secondary | ICD-10-CM | POA: Diagnosis not present

## 2021-08-28 MED ORDER — RIFAXIMIN 550 MG PO TABS: 550.0000 mg | ORAL_TABLET | Freq: Three times a day (TID) | ORAL | 0 refills | Status: AC

## 2021-08-28 NOTE — Patient Instructions (Addendum)
Please have blood work completed at [facility].  Start Xifaxan 550 mg 3 times daily for 14 days for IBS.  You may continue to use dicyclomine as needed.  Hold in setting of constipation.  Monitor for recurrent rectal bleeding.  You may try using Preparation H twice daily for 7 days for possible hemorrhoids.  If you continue to have rectal bleeding, we should proceed with a colonoscopy as we discussed.   Continue pantoprazole 40 mg daily.  We will follow-up with you in about 3 months. Please let me know if you have any worsening symptoms or other questions or concerns prior to your next visit.   Ermalinda Memos, PA-C [facility]

## 2021-08-29 ENCOUNTER — Telehealth: Payer: Self-pay | Admitting: *Deleted

## 2021-08-29 DIAGNOSIS — R194 Change in bowel habit: Secondary | ICD-10-CM | POA: Diagnosis not present

## 2021-08-29 DIAGNOSIS — K625 Hemorrhage of anus and rectum: Secondary | ICD-10-CM | POA: Diagnosis not present

## 2021-08-29 NOTE — Telephone Encounter (Signed)
Received approval letter for Xifaxan 550mg .

## 2021-08-31 LAB — CBC WITH DIFFERENTIAL/PLATELET
Basophils Absolute: 0 10*3/uL (ref 0.0–0.2)
Basos: 0 %
EOS (ABSOLUTE): 0.1 10*3/uL (ref 0.0–0.4)
Eos: 2 %
Hematocrit: 37.5 % (ref 37.5–51.0)
Hemoglobin: 13.3 g/dL (ref 13.0–17.7)
Immature Grans (Abs): 0 10*3/uL (ref 0.0–0.1)
Immature Granulocytes: 0 %
Lymphocytes Absolute: 1.5 10*3/uL (ref 0.7–3.1)
Lymphs: 23 %
MCH: 32 pg (ref 26.6–33.0)
MCHC: 35.5 g/dL (ref 31.5–35.7)
MCV: 90 fL (ref 79–97)
Monocytes Absolute: 0.4 10*3/uL (ref 0.1–0.9)
Monocytes: 6 %
Neutrophils Absolute: 4.6 10*3/uL (ref 1.4–7.0)
Neutrophils: 69 %
Platelets: 234 10*3/uL (ref 150–450)
RBC: 4.15 x10E6/uL (ref 4.14–5.80)
RDW: 14 % (ref 11.6–15.4)
WBC: 6.7 10*3/uL (ref 3.4–10.8)

## 2021-08-31 LAB — TSH: TSH: 1.62 u[IU]/mL (ref 0.450–4.500)

## 2021-08-31 LAB — IGA: IgA/Immunoglobulin A, Serum: 219 mg/dL (ref 90–386)

## 2021-08-31 LAB — TISSUE TRANSGLUTAMINASE, IGA: Transglutaminase IgA: <2 U/mL (ref 0–3)

## 2021-09-06 ENCOUNTER — Encounter: Payer: Self-pay | Admitting: *Deleted

## 2021-09-11 DIAGNOSIS — F3132 Bipolar disorder, current episode depressed, moderate: Secondary | ICD-10-CM | POA: Diagnosis not present

## 2021-09-11 DIAGNOSIS — F902 Attention-deficit hyperactivity disorder, combined type: Secondary | ICD-10-CM | POA: Diagnosis not present

## 2021-09-11 DIAGNOSIS — F4011 Social phobia, generalized: Secondary | ICD-10-CM | POA: Diagnosis not present

## 2021-09-14 ENCOUNTER — Other Ambulatory Visit: Payer: Self-pay | Admitting: Family Medicine

## 2021-09-25 DIAGNOSIS — F330 Major depressive disorder, recurrent, mild: Secondary | ICD-10-CM | POA: Diagnosis not present

## 2021-09-25 DIAGNOSIS — F4310 Post-traumatic stress disorder, unspecified: Secondary | ICD-10-CM | POA: Diagnosis not present

## 2021-10-25 DIAGNOSIS — F330 Major depressive disorder, recurrent, mild: Secondary | ICD-10-CM | POA: Diagnosis not present

## 2021-10-25 DIAGNOSIS — F319 Bipolar disorder, unspecified: Secondary | ICD-10-CM | POA: Diagnosis not present

## 2021-10-25 DIAGNOSIS — F4310 Post-traumatic stress disorder, unspecified: Secondary | ICD-10-CM | POA: Diagnosis not present

## 2021-10-30 DIAGNOSIS — F4310 Post-traumatic stress disorder, unspecified: Secondary | ICD-10-CM | POA: Diagnosis not present

## 2021-10-30 DIAGNOSIS — F330 Major depressive disorder, recurrent, mild: Secondary | ICD-10-CM | POA: Diagnosis not present

## 2021-11-14 NOTE — Progress Notes (Unsigned)
Primary Care Physician:  Jeoffrey Massed, MD  Primary Gastroenterologist: Gerrit Friends. Rourk, MD  Patient Location: Home Reason for Visit: follow up  Persons present on the virtual encounter, with roles: Patient -Bobby Jimenez; Provider - Brooke Bonito, NP   Total time (minutes) spent on medical discussion: 11 minutes  Virtual Visit Encounter Note Visit is conducted virtually and was requested by patient.   I connected with Bobby Jimenez on 11/15/21 at 10:30 AM EDT by video and verified that I am speaking with the correct person using two identifiers.   I discussed the limitations, risks, security and privacy concerns of performing an evaluation and management service by video and the availability of in person appointments. I also discussed with the patient that there may be a patient responsible charge related to this service. The patient expressed understanding and agreed to proceed.  Chief Complaint  Patient presents with   Follow-up    Pt doing better    History of Present Illness: Bobby Jimenez is a 41 y.o. male with a history of ADHD, anxiety, bipolar, HTN, hypertriglyceridemia, OSA on CPAP, diabetes, IBS, GERD, and fatty liver presenting today for follow-up.  Prior concern for coarsened echotexture in September 2020 however had CT in October 2020 and December 2022 without stigmata of advanced liver disease, no splenomegaly or thrombocytopenia.  Last office visit 08/28/2021 noting postprandial bowel movements with urgency and no abdominal pain for the prior 3 months.  Not specifically related to any type of food.  Typically having 3-4 bowel movements daily that were formed, no nocturnal stools.  On dicyclomine 3 times daily before meals.  Pioglitazone DC'd a few months prior.  Denied any recent antibiotics or illness.  Reported toilet tissue hematochezia for 3 months, a total of 3 times with nothing in the stool or water.  Denies any melena or rectal pain.  GERD  well controlled on pantoprazole 40 mg daily and denies any dysphagia.  Was working toward weight loss through diet and exercise and had reported 25 pound weight loss over 6 months - 1 year.  Given trial of Xifaxan.  Labs ordered.  Advised to continue dicyclomine.  Advised Preparation H twice daily for hemorrhoids.  Continue PPI daily.  CBC, TSH, celiac serologies all within normal limits.  Today: Xifaxin was helpful. Having about twice daily bowel movements. Formed stools, no abdominal pain or need to strain. No melena or BRBPR. Does not have any urgency anymore and bowel movements are scattered. Using dicyclomine as needed, maybe once per day for mild cramping. No issues with dairy products. Denies hemorrhoids.   GERD: Denies lack of appetite and early satiety. May have lost a little more weight. Reflux is well controlled since starting the pantoprazole, has been on it for a few years. Has tried to wean in the past but continued to have symptoms to where he wanted to start again. Would like to try to wean dosage. Denies nocturnal symptoms. Symptoms generally throughout the day.  Denies dysphagia, unintentional weight loss.    Medications Current Meds  Medication Sig   ABILIFY 10 MG tablet Take 5 mg by mouth 2 (two) times daily.    amantadine (SYMMETREL) 100 MG capsule Take 1 capsule (100 mg total) by mouth 2 (two) times daily.   clonazePAM (KLONOPIN) 0.5 MG tablet Take 0.25-0.5 mg by mouth 2 (two) times daily. Takes 1/2 tablet in am and 1 tablet at bedtime   dicyclomine (BENTYL) 10 MG capsule TAKE 1 CAPSULE  BY MOUTH 3 TIMES DAILY WITH MEALS AND AT BEDTIME AS NEEDED. HOLD FOR CONSTIPATION.   fluticasone (FLONASE) 50 MCG/ACT nasal spray INSTILL 2 SPRAYS INTO EACH NOSTRIL DAILY.   gemfibrozil (LOPID) 600 MG tablet TAKE 1 TABLET 2 TIMES DAILY BEFORE A MEAL   icosapent Ethyl (VASCEPA) 1 g capsule Take 2 capsules (2 g total) by mouth 2 (two) times daily.   lisinopril (ZESTRIL) 10 MG tablet TAKE 1 TABLET  EVERY DAY   metoprolol tartrate (LOPRESSOR) 25 MG tablet TAKE 1 TABLET TWICE DAILY   Multiple Vitamin (MULTIVITAMIN) tablet Take 1 tablet by mouth daily.   mupirocin ointment (BACTROBAN) 2 % Apply 1 Application topically 3 (three) times daily.   pantoprazole (PROTONIX) 40 MG tablet Take 1 tablet (40 mg total) by mouth daily before breakfast.   ZOLOFT 100 MG tablet Take 200 mg by mouth daily.     History Past Medical History:  Diagnosis Date   ADHD (attention deficit hyperactivity disorder)    Anxiety    Bilateral carpal tunnel syndrome    transient relief from inj by ortho 04/2020; NCS confirmed bilat CTS->surgery planned as of 05/2020 ortho f/u   Bipolar disorder ([place])    Cervical radiculopathy    Diabetes mellitus ([place]) 08/2020   Aug 2022->fasting gluc 130, Hba1c 7.6%   Dizziness    lightheaded   Food allergy    pineapple   GERD (gastroesophageal reflux disease)    Headache syndrome    Hepatic steatosis    u/s and CT confirmed 2020--no cirrhosis.   Hypertension    Hypertriglyceridemia    Very high (>800 while on vascepa and rosuva 06/2019)->rosuva and vascepa d/c'd and gemfibrozil started, also referred pt to advanced lipid clinic   IBS (irritable bowel syndrome)    D.  bentyl helping ([facility])   Latex allergy    Low back pain    Mixed hyperlipidemia    OSA on CPAP 10/16/2016   Tremors of nervous system    arms, dx'd as lasting side effect from lithium use in remote past: psych has him on amantadine for this.   Vertigo     Past Surgical History:  Procedure Laterality Date   CARPAL TUNNEL RELEASE Left    L 05/2020.  Plan for R side to be done.   CARPAL TUNNEL RELEASE Right    07/2020   CHOLECYSTECTOMY N/A 12/26/2016   Procedure: LAPAROSCOPIC CHOLECYSTECTOMY;  Surgeon: Aviva Signs, MD;  Location: [facility];  Service: General;  Laterality: N/A;   KNEE SURGERY Right 1997   arthroscopic   polysomnogram  09/2016   Mild/mod OSA->CPAP (Dr. Joneen Caraway)   [place]    Family History  Adopted: Yes  Problem Relation Age of Onset   High blood pressure Mother    Seizures Mother     Social History   Socioeconomic History   Marital status: Divorced    Spouse name: Not on file   Number of children: 0   Years of education: 13   Highest education level: Not on file  Occupational History   Not on file  Tobacco Use   Smoking status: Former    Packs/day: 1.00    Years: 10.00    Total pack years: 10.00    Types: Cigarettes   Smokeless tobacco: Former    Types: Snuff   Tobacco comments:    quit Nov 2018  Vaping Use   Vaping Use: Never used  Substance and Sexual Activity   Alcohol  use: Yes    Alcohol/week: 1.0 standard drink of alcohol    Types: 1 Cans of beer per week    Comment: heavy in past, short period of time in 20s.; 05/24/20 occ   Drug use: No    Comment: Quit 1996   Sexual activity: Not on file  Other Topics Concern   Not on file  Social History Narrative   Divorced, no children.   Educ: college at [employer]   Patient lives at home with his father and unemployed.  Disability for psych reasons.   Education some college.   Both handed.   Caffeine three cups daily soda and coffee.   Alc: occ beer (remote hx of alc abuse, marijuana, acid--no IV drugs) in 2005-2006, surround his problems coping with GF's death.   Tobacco: former cig smoker, 20 pack-yr hx , quit 2018.   Social Determinants of Health   Financial Resource Strain: Low Risk  (02/22/2021)   Overall Financial Resource Strain (CARDIA)    Difficulty of Paying Living Expenses: Not hard at all  Food Insecurity: No Food Insecurity (02/22/2021)   Hunger Vital Sign    Worried About Running Out of Food in the Last Year: Never true    Ran Out of Food in the Last Year: Never true  Transportation Needs: No Transportation Needs (02/22/2021)   PRAPARE - Administrator, Civil Service (Medical): No    Lack of Transportation (Non-Medical): No  Physical  Activity: Sufficiently Active (02/22/2021)   Exercise Vital Sign    Days of Exercise per Week: 5 days    Minutes of Exercise per Session: 120 min  Stress: No Stress Concern Present (02/22/2021)   [employer] of Occupational Health - Occupational Stress Questionnaire    Feeling of Stress : Not at all  Social Connections: Socially Isolated (02/22/2021)   Social Connection and Isolation Panel [NHANES]    Frequency of Communication with Friends and Family: Once a week    Frequency of Social Gatherings with Friends and Family: More than three times a week    Attends Religious Services: Never    Database administrator or Organizations: No    Attends Banker Meetings: Never    Marital Status: Divorced      Review of Systems: Gen: Denies fever, chills, anorexia. Denies fatigue, weakness, weight loss.  CV: Denies chest pain, palpitations, syncope, peripheral edema, and claudication. Resp: Denies dyspnea at rest, cough, wheezing, coughing up blood, and pleurisy. GI: see HPI Derm: Denies rash, itching, dry skin Psych: Denies depression, anxiety, memory loss, confusion. No homicidal or suicidal ideation.  Heme: Denies bruising, bleeding, and enlarged lymph nodes.  Observations/Objective: No distress. Alert and oriented. Pleasant. Well nourished. Normal mood and affect. Unable to perform complete physical exam due to video encounter.   Assessment:  Irritable bowel syndrome/Increased bowel frequency: Chronic history of IBS, previously using dicyclomine 3 times daily.  Prior work-up negative for celiac disease, hypothyroidism, or anemia.  Recently treated with course of Xifaxan and had significant improvement in symptoms.  He continues to have about 2 soft formed bowel movements daily.  Currently using dicyclomine only about once daily for mild abdominal cramping.  He denies any urgency, nocturnal stools, melena, BRBPR, lack of appetite, unintentional weight loss, or overt abdominal  pain.  He denies any issues with dairy products.  History of cholecystectomy in 2018, if recurrence of symptoms could consider trial of cholestyramine or repeat course of Xifaxan.  GERD: Has been well controlled  on pantoprazole 40 mg daily.  Has been on this medication for many years and does have interest in attempting to wean.  He has attempted to wean in the past but noticed return of symptoms that were noticeable enough to go back to daily dosing.  He is interested in cutting the dose and continuing daily dosing.  We will trial pantoprazole 20 mg daily.  We discussed using famotidine or Tums as needed for breakthrough symptoms and he is to let us know if symptoms worsen.  Plan:  Wean pantoprazole to 20 mg daily from 40 mg daily. GERD diet/lifestyle modifications Famotidine or Tums as needed for breakthrough reflux. Continue dicyclomine as needed for abdominal cramping Follow up in 4 months or sooner if needed.    Follow Up Instructions:  I discussed the assessment and treatment plan with the patient. The patient was provided an opportunity to ask questions and all were answered. The patient agreed with the plan and demonstrated an understanding of the instructions.   The patient was advised to call back or seek an in-person evaluation if the symptoms worsen or if the condition fails to improve as anticipated.    Brooke Bonito, MSN, APRN, FNP-BC, AGACNP-BC [facility]

## 2021-11-15 ENCOUNTER — Telehealth (INDEPENDENT_AMBULATORY_CARE_PROVIDER_SITE_OTHER): Payer: Medicare HMO | Admitting: Gastroenterology

## 2021-11-15 ENCOUNTER — Encounter: Payer: Self-pay | Admitting: Gastroenterology

## 2021-11-15 ENCOUNTER — Telehealth: Payer: Self-pay

## 2021-11-15 VITALS — Ht 70.0 in | Wt 255.0 lb

## 2021-11-15 DIAGNOSIS — R194 Change in bowel habit: Secondary | ICD-10-CM

## 2021-11-15 DIAGNOSIS — K580 Irritable bowel syndrome with diarrhea: Secondary | ICD-10-CM | POA: Diagnosis not present

## 2021-11-15 DIAGNOSIS — K219 Gastro-esophageal reflux disease without esophagitis: Secondary | ICD-10-CM

## 2021-11-15 MED ORDER — PANTOPRAZOLE SODIUM 20 MG PO TBEC
20.0000 mg | DELAYED_RELEASE_TABLET | Freq: Every day | ORAL | 2 refills | Status: DC
Start: 2021-11-15 — End: 2022-03-28

## 2021-11-15 NOTE — Patient Instructions (Signed)
I can finish her current supply of pantoprazole 40 mg, start taking pantoprazole 20 mg daily.  If you experience any breakthrough symptoms, you may use over-the-counter famotidine (Pepcid) or Tums as needed.  If you have breakthrough symptoms more than 3-4 times per week please call the office for further advice.  Follow a GERD diet:  Avoid fried, fatty, greasy, spicy, citrus foods. Avoid caffeine and carbonated beverages. Avoid chocolate. Try eating 4-6 small meals a day rather than 3 large meals. Do not eat within 3 hours of laying down. Prop head of bed up on wood or bricks to create a 6 inch incline.  Continue your dicyclomine as needed.  I hope you have a great holiday season, we will have you follow-up in about 4 months which can be done virtually unless you begin having abdominal pain.  It was a pleasure to see you today. I want to create trusting relationships with patients. If you receive a survey regarding your visit,  I greatly appreciate you taking time to fill this out on paper or through your MyChart. I value your feedback.  Venetia Night, MSN, FNP-BC, AGACNP-BC [facility]

## 2021-11-15 NOTE — Telephone Encounter (Signed)
error 

## 2021-11-17 ENCOUNTER — Other Ambulatory Visit: Payer: Self-pay | Admitting: Family Medicine

## 2021-11-17 ENCOUNTER — Telehealth: Payer: Medicare HMO | Admitting: Gastroenterology

## 2021-11-17 ENCOUNTER — Other Ambulatory Visit: Payer: Self-pay | Admitting: Gastroenterology

## 2021-11-17 NOTE — Telephone Encounter (Signed)
Pt was last seen for CPE 7/3, advised to f/u 3 months. LM for pt to return call to schedule.

## 2021-11-23 DIAGNOSIS — F4310 Post-traumatic stress disorder, unspecified: Secondary | ICD-10-CM | POA: Diagnosis not present

## 2021-11-23 DIAGNOSIS — F319 Bipolar disorder, unspecified: Secondary | ICD-10-CM | POA: Diagnosis not present

## 2021-11-23 DIAGNOSIS — F330 Major depressive disorder, recurrent, mild: Secondary | ICD-10-CM | POA: Diagnosis not present

## 2021-11-23 DIAGNOSIS — E782 Mixed hyperlipidemia: Secondary | ICD-10-CM | POA: Diagnosis not present

## 2021-11-23 DIAGNOSIS — Z79899 Other long term (current) drug therapy: Secondary | ICD-10-CM | POA: Diagnosis not present

## 2021-11-30 ENCOUNTER — Ambulatory Visit (HOSPITAL_BASED_OUTPATIENT_CLINIC_OR_DEPARTMENT_OTHER): Payer: Medicare HMO | Admitting: Internal Medicine

## 2021-11-30 ENCOUNTER — Encounter (HOSPITAL_BASED_OUTPATIENT_CLINIC_OR_DEPARTMENT_OTHER): Payer: Self-pay | Admitting: Internal Medicine

## 2021-11-30 VITALS — BP 110/64 | HR 81 | Ht 70.0 in | Wt 263.9 lb

## 2021-11-30 DIAGNOSIS — E781 Pure hyperglyceridemia: Secondary | ICD-10-CM | POA: Diagnosis not present

## 2021-11-30 MED ORDER — ROSUVASTATIN CALCIUM 5 MG PO TABS: 5.0000 mg | ORAL_TABLET | Freq: Every day | ORAL | 3 refills | Status: DC

## 2021-11-30 NOTE — Patient Instructions (Signed)
Medication Instructions:  START rosuvastatin (crestor) 5mg  daily   *If you need a refill on your cardiac medications before your next appointment, please call your pharmacy*   Lab Work: FASTING lab work to check cholesterol in about 3-4 months    If you have labs (blood work) drawn today and your tests are completely normal, you will receive your results only by: [place] (if you have MyChart) OR A paper copy in the mail If you have any lab test that is abnormal or we need to change your treatment, we will call you to review the results.    Follow-Up: At [facility], you and your health needs are our priority.  As part of our continuing mission to provide you with exceptional heart care, we have created designated Provider Care Teams.  These Care Teams include your primary Cardiologist (physician) and Advanced Practice Providers (APPs -  Physician Assistants and Nurse Practitioners) who all work together to provide you with the care you need, when you need it.  We recommend signing up for the patient portal called "MyChart".  Sign up information is provided on this After Visit Summary.  MyChart is used to connect with patients for Virtual Visits (Telemedicine).  Patients are able to view lab/test results, encounter notes, upcoming appointments, etc.  Non-urgent messages can be sent to your provider as well.   To learn more about what you can do with MyChart, go to [email or web address].    Your next appointment:    3-4 months with Dr. Debara Pickett

## 2021-11-30 NOTE — Progress Notes (Signed)
LIPID CLINIC CONSULT NOTE  Chief Complaint:  High triglycerides  Primary Care Physician: Jeoffrey Massed, MD  Primary Cardiologist:  None  HPI:  Bobby Jimenez is a 41 y.o. male who is being seen today for the evaluation of high triglycerides at the request of [name], [name], MD.  This is a pleasant 41 year old male is kindly referred for evaluation management of hypertriglyceridemia.  [name] has a longstanding history of elevated triglycerides.  He says his uncle by birth had high triglycerides and is very thin.  He did not know much about his father since he is adopted but his birth mother may have also had high triglycerides.  She is now deceased.  Over a year ago triglycerides were noted at 745, then improved somewhat to 581 after work with a dietitian and more recently 3 months ago were elevated 837.  Direct LDL was 53 and hemoglobin S2G was 5.6.  He has no coronary disease that is notable.  He denies any history of pancreatitis.  He is on medications that could alter his triglyceride metabolism, including Abilify which is known to raise triglycerides, as well as this sertraline and to a small extent metoprolol.  He works with a Therapist, sports, Dr. Jannifer Franklin.  He reports very rare alcohol use, no more than 1-2 drinks per week.  02/10/2020  [name] is seen today via telephone follow-up.  Overall he seems to be doing well.  He says he is improved his diet and is more physically active.  He is now working a job that requires more physical activity.  Mind has had continued improvement in his lipids.  Total cholesterol now 168, triglycerides 348 (down from over 800, approximately 6 months ago), HDL 29 and LDL of 82.  APO B level has come down from 122-107.  He is not currently on statin therapy.  11/30/2021  [name] is seen today for follow-up.  He has had an interval increase in his lipids.  Triglycerides have gone up from 289-326.  Total cholesterol 195, his direct LDL  now is 105.  As this is increased slightly, I do think he would benefit from addition of a low-dose statin.  He says he is tried statins in the distant past and had issues with urination but seem to improve after stopping it.  He is however willing to trial a low-dose statin.  PMHx:  Past Medical History:  Diagnosis Date   ADHD (attention deficit hyperactivity disorder)    Anxiety    Bilateral carpal tunnel syndrome    transient relief from inj by ortho 04/2020; NCS confirmed bilat CTS->surgery planned as of 05/2020 ortho f/u   Bipolar disorder ([facility])    Cervical radiculopathy    Diabetes mellitus ([facility]) 08/2020   Aug 2022->fasting gluc 130, Hba1c 7.6%   Dizziness    lightheaded   Food allergy    pineapple   GERD (gastroesophageal reflux disease)    Headache syndrome    Hepatic steatosis    u/s and CT confirmed 2020--no cirrhosis.   Hypertension    Hypertriglyceridemia    Very high (>800 while on vascepa and rosuva 06/2019)->rosuva and vascepa d/c'd and gemfibrozil started, also referred pt to advanced lipid clinic   IBS (irritable bowel syndrome)    D.  bentyl helping ([facility])   Latex allergy    Low back pain    Mixed hyperlipidemia    OSA on CPAP 10/16/2016   Tremors of nervous system    arms,  dx'd as lasting side effect from lithium use in remote past: psych has him on amantadine for this.   Vertigo     Past Surgical History:  Procedure Laterality Date   CARPAL TUNNEL RELEASE Left    L 05/2020.  Plan for R side to be done.   CARPAL TUNNEL RELEASE Right    07/2020   CHOLECYSTECTOMY N/A 12/26/2016   Procedure: LAPAROSCOPIC CHOLECYSTECTOMY;  Surgeon: Franky Macho, MD;  Location: [facility];  Service: General;  Laterality: N/A;   KNEE SURGERY Right 1997   arthroscopic   polysomnogram  09/2016   Mild/mod OSA->CPAP (Dr. Lestine Mount)   WISDOM TOOTH EXTRACTION  1997    FAMHx:  Family History  Adopted: Yes  Problem Relation Age of Onset   High blood pressure Mother     Seizures Mother     SOCHx:   reports that he has quit smoking. His smoking use included cigarettes. He has a 10.00 pack-year smoking history. He has quit using smokeless tobacco.  His smokeless tobacco use included snuff. He reports current alcohol use of about 1.0 standard drink of alcohol per week. He reports that he does not use drugs.  ALLERGIES:  Allergies  Allergen Reactions   Pineapple Shortness Of Breath and Swelling   Latex Swelling   Morphine And Related Hives and Itching   Statins     "locks urinary tract up"    ROS: Pertinent items noted in HPI and remainder of comprehensive ROS otherwise negative.  HOME MEDS: Current Outpatient Medications on File Prior to Visit  Medication Sig Dispense Refill   amantadine (SYMMETREL) 100 MG capsule Take 1 capsule (100 mg total) by mouth 2 (two) times daily. 60 capsule 0   ARIPiprazole (ABILIFY) 15 MG tablet Take 7.5 mg by mouth 2 (two) times daily.     clonazePAM (KLONOPIN) 0.5 MG tablet Take 0.25-0.5 mg by mouth 2 (two) times daily. Takes 1/2 tablet in am and 1 tablet at bedtime     dicyclomine (BENTYL) 10 MG capsule TAKE 1 CAPSULE BY MOUTH 3 TIMES DAILY WITH MEALS AND AT BEDTIME AS NEEDED. HOLD FOR CONSTIPATION. 120 capsule 5   fluticasone (FLONASE) 50 MCG/ACT nasal spray INSTILL 2 SPRAYS INTO EACH NOSTRIL DAILY. 16 g 2   gemfibrozil (LOPID) 600 MG tablet TAKE 1 TABLET 2 TIMES DAILY BEFORE A MEAL 180 tablet 1   icosapent Ethyl (VASCEPA) 1 g capsule Take 2 capsules (2 g total) by mouth 2 (two) times daily. 360 capsule 1   lisinopril (ZESTRIL) 10 MG tablet TAKE 1 TABLET EVERY DAY 90 tablet 0   metoprolol tartrate (LOPRESSOR) 25 MG tablet TAKE 1 TABLET TWICE DAILY 180 tablet 0   Multiple Vitamin (MULTIVITAMIN) tablet Take 1 tablet by mouth daily.     mupirocin ointment (BACTROBAN) 2 % Apply 1 Application topically 3 (three) times daily. 15 g 0   pantoprazole (PROTONIX) 20 MG tablet Take 1 tablet (20 mg total) by mouth daily. 30 tablet 2    ZOLOFT 100 MG tablet Take 200 mg by mouth daily.     fexofenadine (ALLEGRA) 180 MG tablet Take 1 tablet (180 mg total) by mouth daily. 90 tablet 1   No current facility-administered medications on file prior to visit.    LABS/IMAGING: No results found for this or any previous visit (from the past 48 hour(s)). No results found.  LIPID PANEL:    Component Value Date/Time   CHOL 195 08/04/2021 1133   TRIG 326 (H) 08/04/2021 1133   HDL  34 (L) 08/04/2021 1133   CHOLHDL 5.7 (H) 08/04/2021 1133   CHOLHDL 5 03/02/2021 1133   VLDL 57.8 (H) 03/02/2021 1133   LDLCALC 105 (H) 08/04/2021 1133   LDLDIRECT 116 (H) 08/04/2021 1133   LDLDIRECT 119.0 03/02/2021 1133    WEIGHTS: Wt Readings from Last 3 Encounters:  11/30/21 263 lb 14.4 oz (119.7 kg)  11/15/21 255 lb (115.7 kg)  08/28/21 261 lb 12.8 oz (118.8 kg)    VITALS: BP 110/64 (BP Location: Left Arm, Patient Position: Sitting, Cuff Size: Large)   Pulse 81   Ht 5\' 10"  (1.778 m)   Wt 263 lb 14.4 oz (119.7 kg)   SpO2 97%   BMI 37.87 kg/m   EXAM: Deferred  EKG: Deferred  ASSESSMENT: Primary hypertriglyceridemia  PLAN: 1.   Mr. Cathey has had an increase in his triglycerides recently.  He had an A1c in the diabetic range but was able to come off medication with improvement of A1c down to 5.3% with aggressive dietary modification.  Despite this his triglycerides are still elevated.  I will continue his current medications but would suggest adding low-dose statin therapy for additional benefits.  Although he has had some side effects with statins in the past including difficulty in urination, he was willing to try a lower dose which theoretically should have a lower risk of side effects.  Start rosuvastatin 5 mg daily.  We will plan repeat lipids in about 3 to 4 months and follow-up at that time.  Pixie Casino, MD, [facility], [place] Director of the Advanced Lipid Disorders &  Cardiovascular  Risk Reduction Clinic Diplomate of the American Board of Clinical Lipidology Attending Cardiologist  Direct Dial: [phone]  Fax: [phone]  Website:  [email or web address] 11/30/2021, 1:35 PM

## 2021-12-01 DIAGNOSIS — E781 Pure hyperglyceridemia: Secondary | ICD-10-CM | POA: Diagnosis not present

## 2021-12-02 LAB — LIPID PANEL
Chol/HDL Ratio: 6 ratio — ABNORMAL HIGH (ref 0.0–5.0)
Cholesterol, Total: 186 mg/dL (ref 100–199)
HDL: 31 mg/dL — ABNORMAL LOW (ref >39)
LDL Chol Calc (NIH): 79 mg/dL (ref 0–99)
Triglycerides: 472 mg/dL — ABNORMAL HIGH (ref 0–149)
VLDL Cholesterol Cal: 76 mg/dL — ABNORMAL HIGH (ref 5–40)

## 2021-12-12 DIAGNOSIS — F330 Major depressive disorder, recurrent, mild: Secondary | ICD-10-CM | POA: Diagnosis not present

## 2021-12-12 DIAGNOSIS — F4310 Post-traumatic stress disorder, unspecified: Secondary | ICD-10-CM | POA: Diagnosis not present

## 2021-12-15 ENCOUNTER — Encounter: Payer: Self-pay | Admitting: Internal Medicine

## 2021-12-25 ENCOUNTER — Other Ambulatory Visit: Payer: Self-pay | Admitting: Family Medicine

## 2021-12-25 DIAGNOSIS — H7202 Central perforation of tympanic membrane, left ear: Secondary | ICD-10-CM | POA: Diagnosis not present

## 2021-12-25 DIAGNOSIS — H6123 Impacted cerumen, bilateral: Secondary | ICD-10-CM | POA: Diagnosis not present

## 2021-12-25 DIAGNOSIS — H906 Mixed conductive and sensorineural hearing loss, bilateral: Secondary | ICD-10-CM | POA: Diagnosis not present

## 2022-01-09 DIAGNOSIS — H52223 Regular astigmatism, bilateral: Secondary | ICD-10-CM | POA: Diagnosis not present

## 2022-01-09 DIAGNOSIS — E119 Type 2 diabetes mellitus without complications: Secondary | ICD-10-CM | POA: Diagnosis not present

## 2022-01-09 DIAGNOSIS — H5213 Myopia, bilateral: Secondary | ICD-10-CM | POA: Diagnosis not present

## 2022-01-09 DIAGNOSIS — H35033 Hypertensive retinopathy, bilateral: Secondary | ICD-10-CM | POA: Diagnosis not present

## 2022-02-02 ENCOUNTER — Other Ambulatory Visit (HOSPITAL_BASED_OUTPATIENT_CLINIC_OR_DEPARTMENT_OTHER): Payer: Self-pay | Admitting: Internal Medicine

## 2022-02-06 DIAGNOSIS — M25521 Pain in right elbow: Secondary | ICD-10-CM | POA: Diagnosis not present

## 2022-02-06 DIAGNOSIS — T1590XA Foreign body on external eye, part unspecified, unspecified eye, initial encounter: Secondary | ICD-10-CM | POA: Diagnosis not present

## 2022-02-10 DIAGNOSIS — M25521 Pain in right elbow: Secondary | ICD-10-CM | POA: Diagnosis not present

## 2022-02-13 DIAGNOSIS — M25521 Pain in right elbow: Secondary | ICD-10-CM | POA: Diagnosis not present

## 2022-02-15 DIAGNOSIS — F330 Major depressive disorder, recurrent, mild: Secondary | ICD-10-CM | POA: Diagnosis not present

## 2022-02-15 DIAGNOSIS — F319 Bipolar disorder, unspecified: Secondary | ICD-10-CM | POA: Diagnosis not present

## 2022-02-15 DIAGNOSIS — F4310 Post-traumatic stress disorder, unspecified: Secondary | ICD-10-CM | POA: Diagnosis not present

## 2022-02-21 ENCOUNTER — Ambulatory Visit: Payer: Medicare HMO

## 2022-02-21 NOTE — Patient Instructions (Signed)
Health Maintenance, Male Adopting a healthy lifestyle and getting preventive care are important in promoting health and wellness. Ask your health care provider about: The right schedule for you to have regular tests and exams. Things you can do on your own to prevent diseases and keep yourself healthy. What should I know about diet, weight, and exercise? Eat a healthy diet  Eat a diet that includes plenty of vegetables, fruits, low-fat dairy products, and lean protein. Do not eat a lot of foods that are high in solid fats, added sugars, or sodium. Maintain a healthy weight Body mass index (BMI) is a measurement that can be used to identify possible weight problems. It estimates body fat based on height and weight. Your health care provider can help determine your BMI and help you achieve or maintain a healthy weight. Get regular exercise Get regular exercise. This is one of the most important things you can do for your health. Most adults should: Exercise for at least 150 minutes each week. The exercise should increase your heart rate and make you sweat (moderate-intensity exercise). Do strengthening exercises at least twice a week. This is in addition to the moderate-intensity exercise. Spend less time sitting. Even light physical activity can be beneficial. Watch cholesterol and blood lipids Have your blood tested for lipids and cholesterol at 44 years of age, then have this test every 5 years. You may need to have your cholesterol levels checked more often if: Your lipid or cholesterol levels are high. You are older than 44 years of age. You are at high risk for heart disease. What should I know about cancer screening? Many types of cancers can be detected early and may often be prevented. Depending on your health history and family history, you may need to have cancer screening at various ages. This may include screening for: Colorectal cancer. Prostate cancer. Skin cancer. Lung  cancer. What should I know about heart disease, diabetes, and high blood pressure? Blood pressure and heart disease High blood pressure causes heart disease and increases the risk of stroke. This is more likely to develop in people who have high blood pressure readings or are overweight. Talk with your health care provider about your target blood pressure readings. Have your blood pressure checked: Every 3-5 years if you are 18-39 years of age. Every year if you are 40 years old or older. If you are between the ages of 65 and 75 and are a current or former smoker, ask your health care provider if you should have a one-time screening for abdominal aortic aneurysm (AAA). Diabetes Have regular diabetes screenings. This checks your fasting blood sugar level. Have the screening done: Once every three years after age 45 if you are at a normal weight and have a low risk for diabetes. More often and at a younger age if you are overweight or have a high risk for diabetes. What should I know about preventing infection? Hepatitis B If you have a higher risk for hepatitis B, you should be screened for this virus. Talk with your health care provider to find out if you are at risk for hepatitis B infection. Hepatitis C Blood testing is recommended for: Everyone born from 1945 through 1965. Anyone with known risk factors for hepatitis C. Sexually transmitted infections (STIs) You should be screened each year for STIs, including gonorrhea and chlamydia, if: You are sexually active and are younger than 44 years of age. You are older than 44 years of age and your   health care provider tells you that you are at risk for this type of infection. Your sexual activity has changed since you were last screened, and you are at increased risk for chlamydia or gonorrhea. Ask your health care provider if you are at risk. Ask your health care provider about whether you are at high risk for HIV. Your health care provider  may recommend a prescription medicine to help prevent HIV infection. If you choose to take medicine to prevent HIV, you should first get tested for HIV. You should then be tested every 3 months for as long as you are taking the medicine. Follow these instructions at home: Alcohol use Do not drink alcohol if your health care provider tells you not to drink. If you drink alcohol: Limit how much you have to 0-2 drinks a day. Know how much alcohol is in your drink. In the [place] one drink equals one 12 oz bottle of beer (355 mL), one 5 oz glass of wine (148 mL), or one 1 oz glass of hard liquor (44 mL). Lifestyle Do not use any products that contain nicotine or tobacco. These products include cigarettes, chewing tobacco, and vaping devices, such as e-cigarettes. If you need help quitting, ask your health care provider. Do not use street drugs. Do not share needles. Ask your health care provider for help if you need support or information about quitting drugs. General instructions Schedule regular health, dental, and eye exams. Stay current with your vaccines. Tell your health care provider if: You often feel depressed. You have ever been abused or do not feel safe at home. Summary Adopting a healthy lifestyle and getting preventive care are important in promoting health and wellness. Follow your health care provider's instructions about healthy diet, exercising, and getting tested or screened for diseases. Follow your health care provider's instructions on monitoring your cholesterol and blood pressure. This information is not intended to replace advice given to you by your health care provider. Make sure you discuss any questions you have with your health care provider. Document Revised: 06/06/2020 Document Reviewed: 06/06/2020 Elsevier Patient Education  2023 Elsevier Inc.  

## 2022-02-26 ENCOUNTER — Encounter: Payer: Self-pay | Admitting: Family Medicine

## 2022-02-26 ENCOUNTER — Ambulatory Visit (INDEPENDENT_AMBULATORY_CARE_PROVIDER_SITE_OTHER): Payer: Medicare HMO

## 2022-02-26 ENCOUNTER — Ambulatory Visit (INDEPENDENT_AMBULATORY_CARE_PROVIDER_SITE_OTHER): Payer: Medicare HMO | Admitting: Family Medicine

## 2022-02-26 VITALS — BP 113/69 | HR 88 | Temp 98.0°F | Ht 69.75 in | Wt 260.8 lb

## 2022-02-26 VITALS — BP 113/69 | HR 88 | Temp 98.0°F | Ht 69.75 in | Wt 260.0 lb

## 2022-02-26 DIAGNOSIS — I10 Essential (primary) hypertension: Secondary | ICD-10-CM

## 2022-02-26 DIAGNOSIS — E782 Mixed hyperlipidemia: Secondary | ICD-10-CM

## 2022-02-26 DIAGNOSIS — Z0000 Encounter for general adult medical examination without abnormal findings: Secondary | ICD-10-CM

## 2022-02-26 DIAGNOSIS — E119 Type 2 diabetes mellitus without complications: Secondary | ICD-10-CM | POA: Diagnosis not present

## 2022-02-26 LAB — COMPREHENSIVE METABOLIC PANEL
ALT: 30 U/L (ref 0–53)
AST: 21 U/L (ref 0–37)
Albumin: 4.7 g/dL (ref 3.5–5.2)
Alkaline Phosphatase: 60 U/L (ref 39–117)
BUN: 17 mg/dL (ref 6–23)
CO2: 23 mEq/L (ref 19–32)
Calcium: 9.4 mg/dL (ref 8.4–10.5)
Chloride: 105 mEq/L (ref 96–112)
Creatinine, Ser: 0.85 mg/dL (ref 0.40–1.50)
GFR: 106.73 mL/min (ref >60.00)
Glucose, Bld: 129 mg/dL — ABNORMAL HIGH (ref 70–99)
Potassium: 4.4 mEq/L (ref 3.5–5.1)
Sodium: 138 mEq/L (ref 135–145)
Total Bilirubin: 0.4 mg/dL (ref 0.2–1.2)
Total Protein: 7 g/dL (ref 6.0–8.3)

## 2022-02-26 LAB — POCT GLYCOSYLATED HEMOGLOBIN (HGB A1C)
HbA1c POC (<> result, manual entry): 5.7 % (ref 4.0–5.6)
HbA1c, POC (controlled diabetic range): 5.7 % (ref 0.0–7.0)
HbA1c, POC (prediabetic range): 5.7 % (ref 5.7–6.4)
Hemoglobin A1C: 5.7 % — AB (ref 4.0–5.6)

## 2022-02-26 LAB — MICROALBUMIN / CREATININE URINE RATIO
Creatinine,U: 111.6 mg/dL
Microalb Creat Ratio: 0.7 mg/g (ref 0.0–30.0)
Microalb, Ur: 0.8 mg/dL (ref 0.0–1.9)

## 2022-02-26 MED ORDER — MUPIROCIN 2 % EX OINT: 1.0000 | TOPICAL_OINTMENT | Freq: Three times a day (TID) | CUTANEOUS | 0 refills | Status: DC

## 2022-02-26 NOTE — Progress Notes (Signed)
Subjective:   Bobby Jimenez is a 42 y.o. male who presents for Medicare Annual/Subsequent preventive examination.  Review of Systems    Defer to PCP        Objective:    There were no vitals filed for this visit. There is no height or weight on file to calculate BMI.     02/26/2022   10:28 AM 02/22/2021    8:10 AM 01/25/2021   11:25 PM 09/04/2020    2:54 PM 01/27/2020   10:36 AM 12/26/2016    7:24 AM 12/23/2016    3:06 PM  Advanced Directives  Does Patient Have a Medical Advance Directive? No No No No No No No  Would patient like information on creating a medical advance directive?  No - Patient declined No - Patient declined  Yes (MAU/Ambulatory/Procedural Areas - Information given) No - Patient declined No - Patient declined    Current Medications (verified) Outpatient Encounter Medications as of 02/26/2022  Medication Sig   amantadine (SYMMETREL) 100 MG capsule Take 1 capsule (100 mg total) by mouth 2 (two) times daily.   ARIPiprazole (ABILIFY) 15 MG tablet Take 7.5 mg by mouth 2 (two) times daily.   clonazePAM (KLONOPIN) 0.5 MG tablet Take 0.25-0.5 mg by mouth 2 (two) times daily. Takes 1/2 tablet in am and 1 tablet at bedtime   dicyclomine (BENTYL) 10 MG capsule TAKE 1 CAPSULE BY MOUTH 3 TIMES DAILY WITH MEALS AND AT BEDTIME AS NEEDED. HOLD FOR CONSTIPATION.   fluticasone (FLONASE) 50 MCG/ACT nasal spray INSTILL 2 SPRAYS INTO EACH NOSTRIL DAILY.   gemfibrozil (LOPID) 600 MG tablet TAKE 1 TABLET TWICE DAILY BEFORE MEALS   lisinopril (ZESTRIL) 10 MG tablet TAKE 1 TABLET EVERY DAY   meloxicam (MOBIC) 15 MG tablet Take 15 mg by mouth daily.   metoprolol tartrate (LOPRESSOR) 25 MG tablet TAKE 1 TABLET TWICE DAILY   Multiple Vitamin (MULTIVITAMIN) tablet Take 1 tablet by mouth daily.   mupirocin ointment (BACTROBAN) 2 % Apply 1 Application topically 3 (three) times daily.   pantoprazole (PROTONIX) 20 MG tablet Take 1 tablet (20 mg total) by mouth daily.   rosuvastatin  (CRESTOR) 5 MG tablet Take 1 tablet (5 mg total) by mouth daily.   VASCEPA 1 g capsule TAKE 2 CAPSULES TWICE DAILY   ZOLOFT 100 MG tablet Take 200 mg by mouth daily.   fexofenadine (ALLEGRA) 180 MG tablet Take 1 tablet (180 mg total) by mouth daily.   No facility-administered encounter medications on file as of 02/26/2022.    Allergies (verified) Pineapple, Latex, Morphine and related, and Statins   History: Past Medical History:  Diagnosis Date   ADHD (attention deficit hyperactivity disorder)    Anxiety    Bilateral carpal tunnel syndrome    transient relief from inj by ortho 04/2020; NCS confirmed bilat CTS->surgery planned as of 05/2020 ortho f/u   Bipolar disorder (HCC)    Cervical radiculopathy    Diabetes mellitus (HCC) 08/2020   Aug 2022->fasting gluc 130, Hba1c 7.6%   Dizziness    lightheaded   Food allergy    pineapple   GERD (gastroesophageal reflux disease)    Headache syndrome    Hepatic steatosis    u/s and CT confirmed 2020--no cirrhosis.   Hypertension    Hypertriglyceridemia    Very high (>800 while on vascepa and rosuva 06/2019)->rosuva and vascepa d/c'd and gemfibrozil started, also referred pt to advanced lipid clinic.   IBS (irritable bowel syndrome)    D.  bentyl helping ([facility])   Latex allergy    Low back pain    Mixed hyperlipidemia    OSA on CPAP 10/16/2016   Tremors of nervous system    arms, dx'd as lasting side effect from lithium use in remote past: psych has him on amantadine for this.   Vertigo    Past Surgical History:  Procedure Laterality Date   CARPAL TUNNEL RELEASE Left    L 05/2020.  Plan for R side to be done.   CARPAL TUNNEL RELEASE Right    07/2020   CHOLECYSTECTOMY N/A 12/26/2016   Procedure: LAPAROSCOPIC CHOLECYSTECTOMY;  Surgeon: Franky Macho, MD;  Location: [facility];  Service: General;  Laterality: N/A;   KNEE SURGERY Right 1997   arthroscopic   polysomnogram  09/2016   Mild/mod OSA->CPAP (Dr. Lestine Mount)   WISDOM  TOOTH EXTRACTION  1997   Family History  Adopted: Yes  Problem Relation Age of Onset   High blood pressure Mother    Seizures Mother    Social History   Socioeconomic History   Marital status: Divorced    Spouse name: Not on file   Number of children: 0   Years of education: 13   Highest education level: Not on file  Occupational History   Not on file  Tobacco Use   Smoking status: Former    Packs/day: 1.00    Years: 10.00    Total pack years: 10.00    Types: Cigarettes   Smokeless tobacco: Former    Types: Snuff   Tobacco comments:    quit Nov 2018  Vaping Use   Vaping Use: Never used  Substance and Sexual Activity   Alcohol use: Yes    Alcohol/week: 1.0 standard drink of alcohol    Types: 1 Cans of beer per week    Comment: heavy in past, short period of time in 20s.; 05/24/20 occ   Drug use: No    Comment: Quit 1996   Sexual activity: Not Currently  Other Topics Concern   Not on file  Social History Narrative   Divorced, no children.   Educ: college at [employer]   Patient lives at home with his father and unemployed.  Disability for psych reasons.   Education some college.   Both handed.   Caffeine three cups daily soda and coffee.   Alc: occ beer (remote hx of alc abuse, marijuana, acid--no IV drugs) in 2005-2006, surround his problems coping with GF's death.   Tobacco: former cig smoker, 20 pack-yr hx , quit 2018.   Social Determinants of Health   Financial Resource Strain: Low Risk  (02/26/2022)   Overall Financial Resource Strain (CARDIA)    Difficulty of Paying Living Expenses: Not hard at all  Food Insecurity: No Food Insecurity (02/26/2022)   Hunger Vital Sign    Worried About Running Out of Food in the Last Year: Never true    Ran Out of Food in the Last Year: Never true  Transportation Needs: No Transportation Needs (02/26/2022)   PRAPARE - Administrator, Civil Service (Medical): No    Lack of Transportation (Non-Medical): No  Physical  Activity: Sufficiently Active (02/26/2022)   Exercise Vital Sign    Days of Exercise per Week: 5 days    Minutes of Exercise per Session: 120 min  Stress: No Stress Concern Present (02/26/2022)   [employer] of Occupational Health - Occupational Stress Questionnaire    Feeling of Stress : Not at all  Social Connections: Socially Isolated (02/26/2022)   Social Connection and Isolation Panel [NHANES]    Frequency of Communication with Friends and Family: Once a week    Frequency of Social Gatherings with Friends and Family: More than three times a week    Attends Religious Services: Never    Database administrator or Organizations: No    Attends Banker Meetings: Never    Marital Status: Divorced    Tobacco Counseling Counseling given: Not Answered Tobacco comments: quit Nov 2018   Clinical Intake:  Pre-visit preparation completed: Yes  Pain : No/denies pain     Nutritional Risks: None Diabetes: No  How often do you need to have someone help you when you read instructions, pamphlets, or other written materials from your doctor or pharmacy?: 1 - Never  Diabetic?NO  Interpreter Needed?: No  Information entered by :: Analy Bassford,CMA   Activities of Daily Living    02/26/2022   10:29 AM  In your present state of health, do you have any difficulty performing the following activities:  Hearing? 1  Vision? 0  Difficulty concentrating or making decisions? 0  Walking or climbing stairs? 0  Dressing or bathing? 0  Doing errands, shopping? 0  Preparing Food and eating ? N  Using the Toilet? N  In the past six months, have you accidently leaked urine? N  Do you have problems with loss of bowel control? N  Managing your Medications? N  Managing your Finances? N  Housekeeping or managing your Housekeeping? N    Patient Care Team: Jeoffrey Massed, MD as PCP - General (Family Medicine) Jena Gauss Gerrit Friends, MD as Consulting Physician  (Gastroenterology) Beryle Beams, MD as Consulting Physician (Neurology) Rennis Golden Lisette Abu, MD as Consulting Physician (Cardiology) Mack Hook, MD as Consulting Physician (Orthopedic Surgery)  Indicate any recent Medical Services you may have received from other than [facility] providers in the past year (date may be approximate).     Assessment:   This is a routine wellness examination for [place].  Hearing/Vision screen No results found.  Dietary issues and exercise activities discussed: Current Exercise Habits: Home exercise routine   Goals Addressed   None   Depression Screen    02/26/2022    9:17 AM 07/17/2021   10:45 AM 02/22/2021    8:08 AM 12/01/2020    9:08 AM 02/17/2020    4:46 PM 01/27/2020   10:38 AM 02/23/2019    1:20 PM  PHQ 2/9 Scores  PHQ - 2 Score 0 0 0 0 0 0 0    Fall Risk    02/26/2022   10:29 AM 02/22/2021    8:11 AM 02/17/2020    4:46 PM 01/27/2020   10:37 AM 02/23/2019    1:20 PM  Fall Risk   Falls in the past year? 0 0 0 0 0  Number falls in past yr: 0 0 0 0 0  Injury with Fall? 0 0 0 0 0  Risk for fall due to : No Fall Risks Impaired vision     Follow up Falls evaluation completed Falls prevention discussed  Falls prevention discussed     FALL RISK PREVENTION PERTAINING TO THE HOME:  Any stairs in or around the home? Yes  If so, are there any without handrails? No  Home free of loose throw rugs in walkways, pet beds, electrical cords, etc? Yes  Adequate lighting in your home to reduce risk of falls? Yes   ASSISTIVE DEVICES UTILIZED TO  PREVENT FALLS:  Life alert? No  Use of a cane, walker or w/c? No  Grab bars in the bathroom? No  Shower chair or bench in shower? No  Elevated toilet seat or a handicapped toilet? No   TIMED UP AND GO:  Was the test performed? Yes .  Length of time to ambulate 10 feet: 10 sec.   Gait steady and fast without use of assistive device  Cognitive Function:        02/26/2022   10:16 AM 02/22/2021    8:12  AM  6CIT Screen  What Year? 0 points 0 points  What month? 0 points 0 points  What time? 0 points 0 points  Count back from 20 0 points 0 points  Months in reverse 0 points 4 points  Repeat phrase 0 points 2 points  Total Score 0 points 6 points    Immunizations Immunization History  Administered Date(s) Administered   Influenza,inj,Quad PF,6+ Mos 12/01/2020   Moderna Covid-19 Vaccine Bivalent Booster 42yrs & up 12/14/2020   Moderna SARS-COV2 Booster Vaccination 09/02/2020   Moderna Sars-Covid-2 Vaccination 05/28/2019, 06/25/2019   PNEUMOCOCCAL CONJUGATE-20 12/01/2020   Pneumococcal Polysaccharide-23 11/29/2016   Tdap 01/29/2013, 09/04/2020    TDAP status: Up to date  Flu Vaccine status: Declined, Education has been provided regarding the importance of this vaccine but patient still declined. Advised may receive this vaccine at local pharmacy or Health Dept. Aware to provide a copy of the vaccination record if obtained from local pharmacy or Health Dept. Verbalized acceptance and understanding.  Pneumococcal vaccine status: Up to date Does not currently apply to patient.   Covid-19 vaccine status: Declined, Education has been provided regarding the importance of this vaccine but patient still declined. Advised may receive this vaccine at local pharmacy or Health Dept.or vaccine clinic. Aware to provide a copy of the vaccination record if obtained from local pharmacy or Health Dept. Verbalized acceptance and understanding.  Qualifies for Shingles Vaccine? No   Zostavax completed  N/A    Shingrix Completed?: No.    Education has been provided regarding the importance of this vaccine. Patient has been advised to call insurance company to determine out of pocket expense if they have not yet received this vaccine. Advised may also receive vaccine at local pharmacy or Health Dept. Verbalized acceptance and understanding.  Screening Tests Health Maintenance  Topic Date Due   Diabetic  kidney evaluation - Urine ACR  12/01/2021   COVID-19 Vaccine (4 - 2023-24 season) 03/09/2022 (Originally 09/29/2021)   INFLUENZA VACCINE  04/29/2022 (Originally 08/29/2021)   Diabetic kidney evaluation - eGFR measurement  08/01/2022   Medicare Annual Wellness (AWV)  02/27/2023   DTaP/Tdap/Td (3 - Td or Tdap) 09/05/2030   Hepatitis C Screening  Completed   HIV Screening  Completed   HPV VACCINES  Aged Out    Health Maintenance  Health Maintenance Due  Topic Date Due   Diabetic kidney evaluation - Urine ACR  12/01/2021    Colorectal cancer screening: No longer required.  Does not currently apply to patient.  Lung Cancer Screening: (Low Dose CT Chest recommended if Age 47-80 years, 30 pack-year currently smoking OR have quit w/in 15years.) does not qualify.   Lung Cancer Screening Referral: n/a   Additional Screening:  Hepatitis C Screening: does qualify; Completed 08/25/2020  Vision Screening: Recommended annual ophthalmology exams for early detection of glaucoma and other disorders of the eye. Is the patient up to date with their annual eye exam?  n/a  Who is the provider or what is the name of the office in which the patient attends annual eye exams? N/A  If pt is not established with a provider, would they like to be referred to a provider to establish care? No .   Dental Screening: Recommended annual dental exams for proper oral hygiene  Community Resource Referral / Chronic Care Management: CRR required this visit?  No   CCM required this visit?  No      Plan:     I have personally reviewed and noted the following in the patient's chart:   Medical and social history Use of alcohol, tobacco or illicit drugs  Current medications and supplements including opioid prescriptions. Patient is not currently taking opioid prescriptions. Functional ability and status Nutritional status Physical activity Advanced directives List of other physicians Hospitalizations,  surgeries, and ER visits in previous 12 months Vitals Screenings to include cognitive, depression, and falls Referrals and appointments  In addition, I have reviewed and discussed with patient certain preventive protocols, quality metrics, and best practice recommendations. A written personalized care plan for preventive services as well as general preventive health recommendations were provided to patient.     Edgar Frisk, [facility]   02/26/2022   Nurse Notes: Face to Face 30 minute visit.    Mr. Zeitz , Thank you for taking time to come for your Medicare Wellness Visit. I appreciate your ongoing commitment to your health goals. Please review the following plan we discussed and let me know if I can assist you in the future.   These are the goals we discussed:  Goals      Patient Stated     Lose weight by eating healthier     Patient Stated     Lose weight and keep in shape         This is a list of the screening recommended for you and due dates:  Health Maintenance  Topic Date Due   Yearly kidney health urinalysis for diabetes  12/01/2021   COVID-19 Vaccine (4 - 2023-24 season) 03/09/2022*   Flu Shot  04/29/2022*   Yearly kidney function blood test for diabetes  08/01/2022   Medicare Annual Wellness Visit  02/27/2023   DTaP/Tdap/Td vaccine (3 - Td or Tdap) 09/05/2030   Hepatitis C Screening: USPSTF Recommendation to screen - Ages 30-79 yo.  Completed   HIV Screening  Completed   HPV Vaccine  Aged Out  *Topic was postponed. The date shown is not the original due date.

## 2022-02-26 NOTE — Patient Instructions (Signed)
Health Maintenance, Male Adopting a healthy lifestyle and getting preventive care are important in promoting health and wellness. Ask your health care provider about: The right schedule for you to have regular tests and exams. Things you can do on your own to prevent diseases and keep yourself healthy. What should I know about diet, weight, and exercise? Eat a healthy diet  Eat a diet that includes plenty of vegetables, fruits, low-fat dairy products, and lean protein. Do not eat a lot of foods that are high in solid fats, added sugars, or sodium. Maintain a healthy weight Body mass index (BMI) is a measurement that can be used to identify possible weight problems. It estimates body fat based on height and weight. Your health care provider can help determine your BMI and help you achieve or maintain a healthy weight. Get regular exercise Get regular exercise. This is one of the most important things you can do for your health. Most adults should: Exercise for at least 150 minutes each week. The exercise should increase your heart rate and make you sweat (moderate-intensity exercise). Do strengthening exercises at least twice a week. This is in addition to the moderate-intensity exercise. Spend less time sitting. Even light physical activity can be beneficial. Watch cholesterol and blood lipids Have your blood tested for lipids and cholesterol at 44 years of age, then have this test every 5 years. You may need to have your cholesterol levels checked more often if: Your lipid or cholesterol levels are high. You are older than 44 years of age. You are at high risk for heart disease. What should I know about cancer screening? Many types of cancers can be detected early and may often be prevented. Depending on your health history and family history, you may need to have cancer screening at various ages. This may include screening for: Colorectal cancer. Prostate cancer. Skin cancer. Lung  cancer. What should I know about heart disease, diabetes, and high blood pressure? Blood pressure and heart disease High blood pressure causes heart disease and increases the risk of stroke. This is more likely to develop in people who have high blood pressure readings or are overweight. Talk with your health care provider about your target blood pressure readings. Have your blood pressure checked: Every 3-5 years if you are 18-39 years of age. Every year if you are 40 years old or older. If you are between the ages of 65 and 75 and are a current or former smoker, ask your health care provider if you should have a one-time screening for abdominal aortic aneurysm (AAA). Diabetes Have regular diabetes screenings. This checks your fasting blood sugar level. Have the screening done: Once every three years after age 45 if you are at a normal weight and have a low risk for diabetes. More often and at a younger age if you are overweight or have a high risk for diabetes. What should I know about preventing infection? Hepatitis B If you have a higher risk for hepatitis B, you should be screened for this virus. Talk with your health care provider to find out if you are at risk for hepatitis B infection. Hepatitis C Blood testing is recommended for: Everyone born from 1945 through 1965. Anyone with known risk factors for hepatitis C. Sexually transmitted infections (STIs) You should be screened each year for STIs, including gonorrhea and chlamydia, if: You are sexually active and are younger than 44 years of age. You are older than 44 years of age and your   health care provider tells you that you are at risk for this type of infection. Your sexual activity has changed since you were last screened, and you are at increased risk for chlamydia or gonorrhea. Ask your health care provider if you are at risk. Ask your health care provider about whether you are at high risk for HIV. Your health care provider  may recommend a prescription medicine to help prevent HIV infection. If you choose to take medicine to prevent HIV, you should first get tested for HIV. You should then be tested every 3 months for as long as you are taking the medicine. Follow these instructions at home: Alcohol use Do not drink alcohol if your health care provider tells you not to drink. If you drink alcohol: Limit how much you have to 0-2 drinks a day. Know how much alcohol is in your drink. In the [place] one drink equals one 12 oz bottle of beer (355 mL), one 5 oz glass of wine (148 mL), or one 1 oz glass of hard liquor (44 mL). Lifestyle Do not use any products that contain nicotine or tobacco. These products include cigarettes, chewing tobacco, and vaping devices, such as e-cigarettes. If you need help quitting, ask your health care provider. Do not use street drugs. Do not share needles. Ask your health care provider for help if you need support or information about quitting drugs. General instructions Schedule regular health, dental, and eye exams. Stay current with your vaccines. Tell your health care provider if: You often feel depressed. You have ever been abused or do not feel safe at home. Summary Adopting a healthy lifestyle and getting preventive care are important in promoting health and wellness. Follow your health care provider's instructions about healthy diet, exercising, and getting tested or screened for diseases. Follow your health care provider's instructions on monitoring your cholesterol and blood pressure. This information is not intended to replace advice given to you by your health care provider. Make sure you discuss any questions you have with your health care provider. Document Revised: 06/06/2020 Document Reviewed: 06/06/2020 Elsevier Patient Education  2023 Elsevier Inc.  

## 2022-02-26 NOTE — Progress Notes (Signed)
OFFICE VISIT  02/26/2022  CC:  Chief Complaint  Patient presents with   Annual Exam    Pt is fasting    Patient is a 42 y.o. male who presents for 50-month follow-up diabetes, hypertension, and hypertriglyceridemia. A/P as of last visit: "1 type 2 diabetes.  Very well controlled. We will go ahead and get off Actos and see how things go. Complete metabolic panel today.   #2 hypertension, well controlled on lisinopril 10 mg a day and Lopressor 25 mg twice daily. Electrolytes and creatinine today.   #3 hypertriglyceridemia.  Patient is on gemfibrozil 600 mg twice daily and Vascepa 2 g twice daily. He has made appropriate follow-up appointment with Dr. Debara Pickett. Hepatic panel today.   #4 Health maintenance exam: Reviewed age and gender appropriate health maintenance issues (prudent diet, regular exercise, health risks of tobacco and excessive alcohol, use of seatbelts, fire alarms in home, use of sunscreen).  Also reviewed age and gender appropriate health screening as well as vaccine recommendations. Vaccines: ALL UTD Labs: cmet Prostate ca screening:  average risk patient= as per latest guidelines, start screening at 73 yrs of age. Colon ca screening: average risk patient= as per latest guidelines, start screening at age 8."  INTERIM HX: Doing well. Hemoglobin A1c at his psychiatrist was 6.1% in October 2023.   He saw Dr. Debara Pickett for follow-up hypertriglyceridemia on 11/30/2021 and rosuvastatin 5 mg daily was added to his Vascepa and gemfibrozil.  Past Medical History:  Diagnosis Date   ADHD (attention deficit hyperactivity disorder)    Anxiety    Bilateral carpal tunnel syndrome    transient relief from inj by ortho 04/2020; NCS confirmed bilat CTS->surgery planned as of 05/2020 ortho f/u   Bipolar disorder ([place])    Cervical radiculopathy    Diabetes mellitus ([place]) 08/2020   Aug 2022->fasting gluc 130, Hba1c 7.6%   Dizziness    lightheaded   Food allergy    pineapple   GERD  (gastroesophageal reflux disease)    Headache syndrome    Hepatic steatosis    u/s and CT confirmed 2020--no cirrhosis.   Hypertension    Hypertriglyceridemia    Very high (>800 while on vascepa and rosuva 06/2019)->rosuva and vascepa d/c'd and gemfibrozil started, also referred pt to advanced lipid clinic   IBS (irritable bowel syndrome)    D.  bentyl helping ([facility])   Latex allergy    Low back pain    Mixed hyperlipidemia    OSA on CPAP 10/16/2016   Tremors of nervous system    arms, dx'd as lasting side effect from lithium use in remote past: psych has him on amantadine for this.   Vertigo     Past Surgical History:  Procedure Laterality Date   CARPAL TUNNEL RELEASE Left    L 05/2020.  Plan for R side to be done.   CARPAL TUNNEL RELEASE Right    07/2020   CHOLECYSTECTOMY N/A 12/26/2016   Procedure: LAPAROSCOPIC CHOLECYSTECTOMY;  Surgeon: Aviva Signs, MD;  Location: [facility];  Service: General;  Laterality: N/A;   KNEE SURGERY Right 1997   arthroscopic   polysomnogram  09/2016   Mild/mod OSA->CPAP (Dr. Joneen Caraway)   [place]    Outpatient Medications Prior to Visit  Medication Sig Dispense Refill   amantadine (SYMMETREL) 100 MG capsule Take 1 capsule (100 mg total) by mouth 2 (two) times daily. 60 capsule 0   ARIPiprazole (ABILIFY) 15 MG tablet Take 7.5 mg by mouth  2 (two) times daily.     clonazePAM (KLONOPIN) 0.5 MG tablet Take 0.25-0.5 mg by mouth 2 (two) times daily. Takes 1/2 tablet in am and 1 tablet at bedtime     dicyclomine (BENTYL) 10 MG capsule TAKE 1 CAPSULE BY MOUTH 3 TIMES DAILY WITH MEALS AND AT BEDTIME AS NEEDED. HOLD FOR CONSTIPATION. 120 capsule 5   fluticasone (FLONASE) 50 MCG/ACT nasal spray INSTILL 2 SPRAYS INTO EACH NOSTRIL DAILY. 16 g 0   gemfibrozil (LOPID) 600 MG tablet TAKE 1 TABLET TWICE DAILY BEFORE MEALS 180 tablet 3   lisinopril (ZESTRIL) 10 MG tablet TAKE 1 TABLET EVERY DAY 90 tablet 0   meloxicam (MOBIC) 15 MG  tablet Take 15 mg by mouth daily.     metoprolol tartrate (LOPRESSOR) 25 MG tablet TAKE 1 TABLET TWICE DAILY 180 tablet 0   Multiple Vitamin (MULTIVITAMIN) tablet Take 1 tablet by mouth daily.     pantoprazole (PROTONIX) 20 MG tablet Take 1 tablet (20 mg total) by mouth daily. 30 tablet 2   rosuvastatin (CRESTOR) 5 MG tablet Take 1 tablet (5 mg total) by mouth daily. 90 tablet 3   VASCEPA 1 g capsule TAKE 2 CAPSULES TWICE DAILY 360 capsule 3   ZOLOFT 100 MG tablet Take 200 mg by mouth daily.     fexofenadine (ALLEGRA) 180 MG tablet Take 1 tablet (180 mg total) by mouth daily. 90 tablet 1   mupirocin ointment (BACTROBAN) 2 % Apply 1 Application topically 3 (three) times daily. 15 g 0   No facility-administered medications prior to visit.    Allergies  Allergen Reactions   Pineapple Shortness Of Breath and Swelling   Latex Swelling   Morphine And Related Hives and Itching   Statins     "locks urinary tract up"    Review of Systems As per HPI  PE:    02/26/2022    9:17 AM 11/30/2021    1:18 PM 11/15/2021    9:15 AM  Vitals with BMI  Height 5' 9.75" 5\' 10"  5\' 10"   Weight 260 lbs 13 oz 263 lbs 14 oz 255 lbs  BMI 37.67 37.87 36.59  Systolic 113 110   Diastolic 69 64   Pulse 88 81      Physical Exam  Gen: Alert, well appearing.  Patient is oriented to person, place, time, and situation. AFFECT: pleasant, lucid thought and speech. CV: RRR, no m/r/g.   LUNGS: CTA bilat, nonlabored resps, good aeration in all lung fields.   LABS:  Last CBC Lab Results  Component Value Date   WBC 6.7 08/29/2021   HGB 13.3 08/29/2021   HCT 37.5 08/29/2021   MCV 90 08/29/2021   MCH 32.0 08/29/2021   RDW 14.0 08/29/2021   PLT 234 08/29/2021   Last metabolic panel Lab Results  Component Value Date   GLUCOSE 99 07/31/2021   NA 138 07/31/2021   K 4.3 07/31/2021   CL 106 07/31/2021   CO2 21 07/31/2021   BUN 29 (H) 07/31/2021   CREATININE 0.84 07/31/2021   GFRNONAA >60 01/25/2021    CALCIUM 9.6 07/31/2021   PROT 7.3 07/31/2021   ALBUMIN 4.5 01/25/2021   BILITOT 0.3 07/31/2021   ALKPHOS 45 01/25/2021   AST 14 07/31/2021   ALT 19 07/31/2021   ANIONGAP 9 01/25/2021   Last lipids Lab Results  Component Value Date   CHOL 186 12/01/2021   HDL 31 (L) 12/01/2021   LDLCALC 79 12/01/2021   LDLDIRECT 116 (H) 08/04/2021  TRIG 472 (H) 12/01/2021   CHOLHDL 6.0 (H) 12/01/2021   Last hemoglobin A1c Lab Results  Component Value Date   HGBA1C 5.7 (A) 02/26/2022   HGBA1C 5.7 02/26/2022   HGBA1C 5.7 02/26/2022   HGBA1C 5.7 02/26/2022   Last thyroid functions Lab Results  Component Value Date   TSH 1.620 08/29/2021   IMPRESSION AND PLAN:  1) DM 2, great control.  Was on pioglitazone at one point but got off when A1c dropped significantly.   POC Hba1c today is 5.7%. Urine microalb/cr today.  #2, hypertension.  Well-controlled on lisinopril 10 mg a day and Lopressor 25 mg twice a day.  Electrolytes and creatinine today.  3.  Mixed hyperlipidemia.  His main issue has been very high triglycerides. Followed by Dr. Debara Pickett with advanced lipid clinic. Currently on gemfibrozil 600 mg twice a day and Vascepa 2 g twice a day, and rosuvastatin 5 mg a day.  He has follow-up with Dr. Debara Pickett on 04/19/2022. Hepatic panel today.  An After Visit Summary was printed and given to the patient.  FOLLOW UP: Return in about 6 months (around 08/27/2022) for annual CPE (fasting).  Signed:  Crissie Sickles, MD           02/26/2022

## 2022-03-07 ENCOUNTER — Encounter: Payer: Self-pay | Admitting: Gastroenterology

## 2022-03-07 ENCOUNTER — Telehealth (INDEPENDENT_AMBULATORY_CARE_PROVIDER_SITE_OTHER): Payer: Medicare HMO | Admitting: Gastroenterology

## 2022-03-07 DIAGNOSIS — Z79899 Other long term (current) drug therapy: Secondary | ICD-10-CM

## 2022-03-07 NOTE — Progress Notes (Signed)
Patient not seen. No answer for visit and patient has not called back.

## 2022-03-15 DIAGNOSIS — M25521 Pain in right elbow: Secondary | ICD-10-CM | POA: Diagnosis not present

## 2022-03-16 ENCOUNTER — Telehealth: Payer: Self-pay | Admitting: Family Medicine

## 2022-03-16 MED ORDER — LISINOPRIL 10 MG PO TABS: 10.0000 mg | ORAL_TABLET | Freq: Every day | ORAL | 1 refills | Status: DC

## 2022-03-16 MED ORDER — METOPROLOL TARTRATE 25 MG PO TABS: 25.0000 mg | ORAL_TABLET | Freq: Two times a day (BID) | ORAL | 1 refills | Status: DC

## 2022-03-16 NOTE — Telephone Encounter (Signed)
Pt is needing a refill on metoprolol tartrate (LOPRESSOR) 25 MG tablet  and   lisinopril (ZESTRIL) 10 MG tablet    Pharmacy is confirmed and correct as [facility]

## 2022-03-19 NOTE — Telephone Encounter (Signed)
Pt advised refills sent. °

## 2022-03-26 DIAGNOSIS — F330 Major depressive disorder, recurrent, mild: Secondary | ICD-10-CM | POA: Diagnosis not present

## 2022-03-26 DIAGNOSIS — F4310 Post-traumatic stress disorder, unspecified: Secondary | ICD-10-CM | POA: Diagnosis not present

## 2022-03-27 ENCOUNTER — Other Ambulatory Visit: Payer: Self-pay | Admitting: Gastroenterology

## 2022-03-27 ENCOUNTER — Other Ambulatory Visit: Payer: Self-pay | Admitting: Family Medicine

## 2022-03-28 DIAGNOSIS — M25521 Pain in right elbow: Secondary | ICD-10-CM | POA: Diagnosis not present

## 2022-04-19 ENCOUNTER — Ambulatory Visit (HOSPITAL_BASED_OUTPATIENT_CLINIC_OR_DEPARTMENT_OTHER): Payer: Medicare HMO | Admitting: Internal Medicine

## 2022-04-19 ENCOUNTER — Encounter (HOSPITAL_BASED_OUTPATIENT_CLINIC_OR_DEPARTMENT_OTHER): Payer: Self-pay | Admitting: Internal Medicine

## 2022-04-19 ENCOUNTER — Ambulatory Visit: Payer: Medicare HMO | Admitting: Pulmonary Disease

## 2022-04-19 VITALS — BP 121/74 | HR 85 | Ht 69.75 in | Wt 267.0 lb

## 2022-04-19 DIAGNOSIS — E781 Pure hyperglyceridemia: Secondary | ICD-10-CM

## 2022-04-19 NOTE — Patient Instructions (Addendum)
Medication Instructions:  Your physician has recommended you make the following change in your medication:   Increase: Rosuvastatin 10mg  daily  *If you need a refill on your cardiac medications before your next appointment, please call your pharmacy*   Lab Work: FASTING lab work to check cholesterol about 1 week before your next visit   Follow-Up: At [facility], you and your health needs are our priority.  As part of our continuing mission to provide you with exceptional heart care, we have created designated Provider Care Teams.  These Care Teams include your primary Cardiologist (physician) and Advanced Practice Providers (APPs -  Physician Assistants and Nurse Practitioners) who all work together to provide you with the care you need, when you need it.  We recommend signing up for the patient portal called "MyChart".  Sign up information is provided on this After Visit Summary.  MyChart is used to connect with patients for Virtual Visits (Telemedicine).  Patients are able to view lab/test results, encounter notes, upcoming appointments, etc.  Non-urgent messages can be sent to your provider as well.   To learn more about what you can do with MyChart, go to [email or web address].    Your next appointment:   6 month(s)  Provider:   K. Mali Hilty, MD

## 2022-04-19 NOTE — Progress Notes (Signed)
LIPID CLINIC CONSULT NOTE  Chief Complaint:  High triglycerides  Primary Care Physician: Tammi Sou, MD  Primary Cardiologist:  None  HPI:  Bobby Jimenez is a 42 y.o. male who is being seen today for the evaluation of high triglycerides at the request of [name], Adrian Blackwater, MD.  This is a pleasant 42 year old male is kindly referred for evaluation management of hypertriglyceridemia.  Bobby Jimenez has a longstanding history of elevated triglycerides.  He says his uncle by birth had high triglycerides and is very thin.  He did not know much about his father since he is adopted but his birth mother may have also had high triglycerides.  She is now deceased.  Over a year ago triglycerides were noted at 745, then improved somewhat to 581 after work with a dietitian and more recently 3 months ago were elevated 837.  Direct LDL was 53 and hemoglobin A1c was 5.6.  He has no coronary disease that is notable.  He denies any history of pancreatitis.  He is on medications that could alter his triglyceride metabolism, including Abilify which is known to raise triglycerides, as well as this sertraline and to a small extent metoprolol.  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  He reports very rare alcohol use, no more than 1-2 drinks per week.  02/10/2020  Bobby Jimenez is seen today via telephone follow-up.  Overall he seems to be doing well.  He says he is improved his diet and is more physically active.  He is now working a job that requires more physical activity.  Mind has had continued improvement in his lipids.  Total cholesterol now 168, triglycerides 348 (down from over 800, approximately 6 months ago), HDL 29 and LDL of 82.  APO B level has come down from 122-107.  He is not currently on statin therapy.  11/30/2021  Bobby Jimenez is seen today for follow-up.  He has had an interval increase in his lipids.  Triglycerides have gone up from 289-326.  Total cholesterol 195, his direct LDL  now is 105.  As this is increased slightly, I do think he would benefit from addition of a low-dose statin.  He says he is tried statins in the distant past and had issues with urination but seem to improve after stopping it.  He is however willing to trial a low-dose statin.  04/19/2022  Bobby Jimenez is seen today in follow-up.  His last lipids in November showed a small increase in triglycerides.  Total cholesterol however is come down to 186, triglycerides 472, HDL 31 and LDL is 79.  He seems to be tolerating low-dose rosuvastatin without any additional issues.  PMHx:  Past Medical History:  Diagnosis Date   ADHD (attention deficit hyperactivity disorder)    Anxiety    Bilateral carpal tunnel syndrome    transient relief from inj by ortho 04/2020; NCS confirmed bilat CTS->surgery planned as of 05/2020 ortho f/u   Bipolar disorder ([place])    Cervical radiculopathy    Diabetes mellitus ([place]) 08/2020   Aug 2022->fasting gluc 130, Hba1c 7.6%   Dizziness    lightheaded   Food allergy    pineapple   GERD (gastroesophageal reflux disease)    Headache syndrome    Hepatic steatosis    u/s and CT confirmed 2020--no cirrhosis.   Hypertension    Hypertriglyceridemia    Very high (>800 while on vascepa and rosuva 06/2019)->rosuva and vascepa d/c'd and gemfibrozil started, also referred pt to  advanced lipid clinic.   IBS (irritable bowel syndrome)    D.  bentyl helping ([facility])   Latex allergy    Low back pain    Mixed hyperlipidemia    OSA on CPAP 10/16/2016   Tremors of nervous system    arms, dx'd as lasting side effect from lithium use in remote past: psych has him on amantadine for this.   Vertigo     Past Surgical History:  Procedure Laterality Date   CARPAL TUNNEL RELEASE Left    L 05/2020.  Plan for R side to be done.   CARPAL TUNNEL RELEASE Right    07/2020   CHOLECYSTECTOMY N/A 12/26/2016   Procedure: LAPAROSCOPIC CHOLECYSTECTOMY;  Surgeon: Aviva Signs, MD;   Location: [facility];  Service: General;  Laterality: N/A;   KNEE SURGERY Right 1997   arthroscopic   polysomnogram  09/2016   Mild/mod OSA->CPAP (Dr. Joneen Caraway)   WISDOM TOOTH EXTRACTION  1997    FAMHx:  Family History  Adopted: Yes  Problem Relation Age of Onset   High blood pressure Mother    Seizures Mother     SOCHx:   reports that he has quit smoking. His smoking use included cigarettes. He has a 10.00 pack-year smoking history. He has quit using smokeless tobacco.  His smokeless tobacco use included snuff. He reports current alcohol use of about 1.0 standard drink of alcohol per week. He reports that he does not use drugs.  ALLERGIES:  Allergies  Allergen Reactions   Pineapple Shortness Of Breath and Swelling   Latex Swelling   Morphine And Related Hives and Itching   Statins     "locks urinary tract up"    ROS: Pertinent items noted in HPI and remainder of comprehensive ROS otherwise negative.  HOME MEDS: Current Outpatient Medications on File Prior to Visit  Medication Sig Dispense Refill   amantadine (SYMMETREL) 100 MG capsule Take 1 capsule (100 mg total) by mouth 2 (two) times daily. 60 capsule 0   ARIPiprazole (ABILIFY) 15 MG tablet Take 7.5 mg by mouth 2 (two) times daily.     clonazePAM (KLONOPIN) 0.5 MG tablet Take 0.25-0.5 mg by mouth 2 (two) times daily. Takes 1/2 tablet in am and 1 tablet at bedtime     dicyclomine (BENTYL) 10 MG capsule TAKE 1 CAPSULE BY MOUTH 3 TIMES DAILY WITH MEALS AND AT BEDTIME AS NEEDED. HOLD FOR CONSTIPATION. 120 capsule 5   fexofenadine (ALLEGRA) 180 MG tablet Take 1 tablet (180 mg total) by mouth daily. 90 tablet 1   fluticasone (FLONASE) 50 MCG/ACT nasal spray INSTILL 2 SPRAYS INTO EACH NOSTRIL DAILY. 16 g 3   gemfibrozil (LOPID) 600 MG tablet TAKE 1 TABLET TWICE DAILY BEFORE MEALS 180 tablet 3   lisinopril (ZESTRIL) 10 MG tablet Take 1 tablet (10 mg total) by mouth daily. 90 tablet 1   metoprolol tartrate (LOPRESSOR) 25 MG tablet  Take 1 tablet (25 mg total) by mouth 2 (two) times daily. 180 tablet 1   Multiple Vitamin (MULTIVITAMIN) tablet Take 1 tablet by mouth daily.     pantoprazole (PROTONIX) 20 MG tablet TAKE ONE TABLET BY MOUTH ONCE DAILY. 30 tablet 4   rosuvastatin (CRESTOR) 5 MG tablet Take 1 tablet (5 mg total) by mouth daily. 90 tablet 3   VASCEPA 1 g capsule TAKE 2 CAPSULES TWICE DAILY 360 capsule 3   ZOLOFT 100 MG tablet Take 200 mg by mouth daily.     No current facility-administered medications on  file prior to visit.    LABS/IMAGING: No results found for this or any previous visit (from the past 48 hour(s)). No results found.  LIPID PANEL:    Component Value Date/Time   CHOL 186 12/01/2021 1143   TRIG 472 (H) 12/01/2021 1143   HDL 31 (L) 12/01/2021 1143   CHOLHDL 6.0 (H) 12/01/2021 1143   CHOLHDL 5 03/02/2021 1133   VLDL 57.8 (H) 03/02/2021 1133   LDLCALC 79 12/01/2021 1143   LDLDIRECT 116 (H) 08/04/2021 1133   LDLDIRECT 119.0 03/02/2021 1133    WEIGHTS: Wt Readings from Last 3 Encounters:  04/19/22 267 lb (121.1 kg)  02/26/22 260 lb (117.9 kg)  02/26/22 260 lb 12.8 oz (118.3 kg)    VITALS: BP 121/74 (BP Location: Right Arm, Patient Position: Sitting, Cuff Size: Large)   Pulse 85   Ht 5' 9.75" (1.772 m)   Wt 267 lb (121.1 kg)   BMI 38.59 kg/m   EXAM: Deferred  EKG: Deferred  ASSESSMENT: Primary hypertriglyceridemia  PLAN: 1.   Bobby Jimenez has had recent increases in his triglycerides with last labs checked in November.  He has gained some weight and I suspect his triglycerides might be higher.  He seems to be tolerating low-dose rosuvastatin.  I advise increasing it further from 5 to 10 mg daily.  Will continue his gemfibrozil and Vascepa.  He will continue to work on diet and activity.  Plan repeat lipids in about 4 to 6 months and follow-up with me afterwards.  Pixie Casino, MD, [facility], [place] Director of the Advanced Lipid  Disorders &  Cardiovascular Risk Reduction Clinic Diplomate of the American Board of Clinical Lipidology Attending Cardiologist  Direct Dial: [phone]  Fax: [phone]  Website:  [email or web address] 04/19/2022, 3:05 PM

## 2022-04-20 ENCOUNTER — Ambulatory Visit: Payer: Medicare HMO | Admitting: Pulmonary Disease

## 2022-04-20 ENCOUNTER — Encounter: Payer: Self-pay | Admitting: Pulmonary Disease

## 2022-04-20 VITALS — BP 139/79 | HR 83 | Ht 69.0 in | Wt 261.6 lb

## 2022-04-20 DIAGNOSIS — G4733 Obstructive sleep apnea (adult) (pediatric): Secondary | ICD-10-CM | POA: Diagnosis not present

## 2022-04-20 NOTE — Patient Instructions (Signed)
Will arrange for new CPAP machine  Follow up in 4 months 

## 2022-04-20 NOTE — Progress Notes (Signed)
[facility]  Chief Complaint  Patient presents with   Follow-up    Pt f/u for OSA he is interested in the [place] device and wants to know if he meets the criteria    Constitutional:  BP 139/79   Pulse 83   Ht 5\' 9"  (1.753 m)   Wt 261 lb 9.6 oz (118.7 kg)   SpO2 97%   BMI 38.63 kg/m   Past Medical History:  ADHD, Anxiety, Bipolar, Neck pain, GERD, Fatty liver, Headache, HTN, IBS, HLD, Vertigo, Tremor, DM type 2  Past Surgical History:  He  has a past surgical history that includes Wisdom tooth extraction (1997); Knee surgery (Right, 1997); Cholecystectomy (N/A, 12/26/2016); polysomnogram (09/2016); Carpal tunnel release (Left); and Carpal tunnel release (Right).  Brief Summary:  Bobby Jimenez is a 42 y.o. male former smoker with obstructive sleep apnea.       Subjective:   He asked about Inspire device.  Uses CPAP nightly.  Mask slips off sometimes.  Pressure setting comfortable.  Not having sinus congestion or dry mouth.  He changed his diet after he was told he has diabetes and is hoping to lose weight.  Physical Exam:   Appearance - well kempt   ENMT - no sinus tenderness, no oral exudate, no LAN, Mallampati 4 airway, no stridor  Respiratory - equal breath sounds bilaterally, no wheezing or rales  CV - s1s2 regular rate and rhythm, no murmurs  Ext - no clubbing, no edema  Skin - no rashes  Psych - normal mood and affect    Sleep Tests:  PSG 10/16/16 >> AHI 13.1, SpO2 low 88% PSG 10/30/17 >> AHI 32.1, SpO2 low 88%; CPAP 8 cm H2O CPAP 03/21/22 to 04/18/12 >> used on 26 of 30 nights with average 6 hrs 53 min.  Average AHI 5.9 with CPAP 12 cm H2O  Social History:  He  reports that he has quit smoking. His smoking use included cigarettes. He has a 10.00 pack-year smoking history. He has quit using smokeless tobacco.  His smokeless tobacco use included snuff. He reports current alcohol use of about 1.0 standard  drink of alcohol per week. He reports that he does not use drugs.  Family History:  His family history includes High blood pressure in his mother; Seizures in his mother. He was adopted.     Assessment/Plan:   Obstructive sleep apnea. - he is compliant with CPAP and reports benefit from therapy - he uses [facility] for his DME - his current CPAP is more than 44 yrs old - will arrange for new [employer] auto CPAP 5 to 15 cm H2O  Obesity. - encouraged him to keep up with his weight loss efforts - discussed how weight loss can improve his sleep and sleep apnea  Time Spent Involved in Patient Care on Day of Examination:  18 minutes  Follow up:   Patient Instructions  Will arrange for new CPAP machine  Follow up in 4 months  Medication List:   Allergies as of 04/20/2022       Reactions   Pineapple Shortness Of Breath, Swelling   Latex Swelling   Morphine And Related Hives, Itching   Statins    "locks urinary tract up"        Medication List        Accurate as of April 20, 2022 10:15 AM. If you have any questions, ask your nurse or doctor.  amantadine 100 MG capsule Commonly known as: SYMMETREL Take 1 capsule (100 mg total) by mouth 2 (two) times daily.   ARIPiprazole 15 MG tablet Commonly known as: ABILIFY Take 7.5 mg by mouth 2 (two) times daily.   clonazePAM 0.5 MG tablet Commonly known as: KLONOPIN Take 0.25-0.5 mg by mouth 2 (two) times daily. Takes 1/2 tablet in am and 1 tablet at bedtime   dicyclomine 10 MG capsule Commonly known as: BENTYL TAKE 1 CAPSULE BY MOUTH 3 TIMES DAILY WITH MEALS AND AT BEDTIME AS NEEDED. HOLD FOR CONSTIPATION.   fexofenadine 180 MG tablet Commonly known as: ALLEGRA Take 1 tablet (180 mg total) by mouth daily.   fluticasone 50 MCG/ACT nasal spray Commonly known as: FLONASE INSTILL 2 SPRAYS INTO EACH NOSTRIL DAILY.   gemfibrozil 600 MG tablet Commonly known as: LOPID TAKE 1 TABLET TWICE DAILY BEFORE  MEALS   lisinopril 10 MG tablet Commonly known as: ZESTRIL Take 1 tablet (10 mg total) by mouth daily.   metoprolol tartrate 25 MG tablet Commonly known as: LOPRESSOR Take 1 tablet (25 mg total) by mouth 2 (two) times daily.   multivitamin tablet Take 1 tablet by mouth daily.   pantoprazole 20 MG tablet Commonly known as: PROTONIX TAKE ONE TABLET BY MOUTH ONCE DAILY.   rosuvastatin 5 MG tablet Commonly known as: CRESTOR Take 1 tablet (5 mg total) by mouth daily.   Vascepa 1 g capsule Generic drug: icosapent Ethyl TAKE 2 CAPSULES TWICE DAILY   Zoloft 100 MG tablet Generic drug: sertraline Take 200 mg by mouth daily.        Signature:  Chesley Mires, MD [place] Pager - [phone] 04/20/2022, 10:15 AM

## 2022-04-25 ENCOUNTER — Telehealth: Payer: Self-pay | Admitting: *Deleted

## 2022-04-25 DIAGNOSIS — G4733 Obstructive sleep apnea (adult) (pediatric): Secondary | ICD-10-CM

## 2022-04-25 NOTE — Telephone Encounter (Signed)
Patient states new CPAP machine not covered under [employer] from [employer] (out of network) and would like an alternative to get a new CPAP machine. Please call and advise patient on alternatives.

## 2022-04-25 NOTE — Telephone Encounter (Signed)
I will need a cpap order placed on the template since I will need to switch companies due to his insurance

## 2022-04-27 NOTE — Telephone Encounter (Signed)
New cpap order placed with the correct template. Order needs to go somewhere other than [facility] since insurance will not cover it. Nothing further needed

## 2022-05-15 ENCOUNTER — Telehealth: Payer: Self-pay | Admitting: Internal Medicine

## 2022-05-15 MED ORDER — ROSUVASTATIN CALCIUM 10 MG PO TABS: 10.0000 mg | ORAL_TABLET | Freq: Every day | ORAL | 3 refills | Status: DC

## 2022-05-15 NOTE — Telephone Encounter (Signed)
Per Dr. Blanchie Dessert last office note patient advised to increase rosuvastatin  to . New rx to pharmacy on file

## 2022-05-15 NOTE — Telephone Encounter (Signed)
Pt c/o medication issue:  1. Name of Medication: rosuvastatin (CRESTOR) 5 MG tablet   2. How are you currently taking this medication (dosage and times per day)?    3. Are you having a reaction (difficulty breathing--STAT)? no  4. What is your medication issue? On patient last office visit (3/21) dr. Rennis Golden increase medication to . But we patient called to get a refill they pharmacy still have it at . Please advise

## 2022-07-27 ENCOUNTER — Other Ambulatory Visit: Payer: Self-pay

## 2022-07-27 ENCOUNTER — Telehealth: Payer: Self-pay

## 2022-07-27 DIAGNOSIS — I10 Essential (primary) hypertension: Secondary | ICD-10-CM

## 2022-07-27 MED ORDER — LISINOPRIL 10 MG PO TABS
10.0000 mg | ORAL_TABLET | Freq: Every day | ORAL | 0 refills | Status: DC
Start: 2022-07-27 — End: 2022-11-05

## 2022-07-27 NOTE — Telephone Encounter (Signed)
Prescription Request  07/27/2022  LOV: Visit date not found  What is the name of the medication or equipment? lisinopril (ZESTRIL) 10 MG tablet   Have you contacted your pharmacy to request a refill? No  Not going to [facility] - please delete and update to [facility]  Which pharmacy would you like this sent to?  [facility] [place], [place] - [address] [place] [address] Phone: [phone] Fax: [phone]  [facility] - [place], [place] - [address] [place] [address] Phone: [phone] Fax: [phone]    Patient notified that their request is being sent to the clinical staff for review and that they should receive a response within 2 business days.   Please advise at Mobile [phone] (mobile)

## 2022-07-27 NOTE — Telephone Encounter (Signed)
Refill sent.

## 2022-08-27 ENCOUNTER — Telehealth: Payer: Self-pay | Admitting: Family Medicine

## 2022-08-27 ENCOUNTER — Other Ambulatory Visit: Payer: Self-pay

## 2022-08-27 MED ORDER — METOPROLOL TARTRATE 25 MG PO TABS: 25.0000 mg | ORAL_TABLET | Freq: Two times a day (BID) | ORAL | 0 refills | Status: DC

## 2022-08-27 NOTE — Telephone Encounter (Signed)
7 day supply sent until scheduled appt, pt made aware

## 2022-08-27 NOTE — Telephone Encounter (Signed)
Patient has upcoming appointment, however he is requesting temp fill on metoprolol tartrate (LOPRESSOR) 25 MG tablet  until his appointment. The pharmacy is [facility] in [place], [place]

## 2022-08-29 NOTE — Patient Instructions (Signed)

## 2022-08-30 ENCOUNTER — Ambulatory Visit: Payer: Medicare Other | Admitting: Family Medicine

## 2022-08-30 ENCOUNTER — Encounter: Payer: Self-pay | Admitting: Family Medicine

## 2022-08-30 VITALS — BP 112/74 | HR 70 | Temp 98.4°F | Ht 70.0 in | Wt 260.2 lb

## 2022-08-30 DIAGNOSIS — E119 Type 2 diabetes mellitus without complications: Secondary | ICD-10-CM

## 2022-08-30 DIAGNOSIS — I10 Essential (primary) hypertension: Secondary | ICD-10-CM | POA: Diagnosis not present

## 2022-08-30 DIAGNOSIS — E781 Pure hyperglyceridemia: Secondary | ICD-10-CM

## 2022-08-30 LAB — COMPREHENSIVE METABOLIC PANEL
ALT: 27 U/L (ref 0–53)
AST: 19 U/L (ref 0–37)
Albumin: 4.8 g/dL (ref 3.5–5.2)
Alkaline Phosphatase: 67 U/L (ref 39–117)
BUN: 16 mg/dL (ref 6–23)
CO2: 26 mEq/L (ref 19–32)
Calcium: 9.8 mg/dL (ref 8.4–10.5)
Chloride: 102 mEq/L (ref 96–112)
Creatinine, Ser: 0.83 mg/dL (ref 0.40–1.50)
GFR: 107.11 mL/min (ref >60.00)
Glucose, Bld: 104 mg/dL — ABNORMAL HIGH (ref 70–99)
Potassium: 4.1 mEq/L (ref 3.5–5.1)
Sodium: 137 mEq/L (ref 135–145)
Total Bilirubin: 0.4 mg/dL (ref 0.2–1.2)
Total Protein: 7.4 g/dL (ref 6.0–8.3)

## 2022-08-30 LAB — LIPID PANEL
Cholesterol: 156 mg/dL (ref 0–200)
HDL: 38.1 mg/dL — ABNORMAL LOW (ref >39.00)
NonHDL: 117.67
Total CHOL/HDL Ratio: 4
Triglycerides: 360 mg/dL — ABNORMAL HIGH (ref 0.0–149.0)
VLDL: 72 mg/dL — ABNORMAL HIGH (ref 0.0–40.0)

## 2022-08-30 LAB — POCT GLYCOSYLATED HEMOGLOBIN (HGB A1C)
HbA1c POC (<> result, manual entry): 5.7 % (ref 4.0–5.6)
HbA1c, POC (controlled diabetic range): 5.7 % (ref 0.0–7.0)
HbA1c, POC (prediabetic range): 5.7 % (ref 5.7–6.4)
Hemoglobin A1C: 5.7 % — AB (ref 4.0–5.6)

## 2022-08-30 LAB — LDL CHOLESTEROL, DIRECT: Direct LDL: 76 mg/dL

## 2022-08-30 MED ORDER — METOPROLOL TARTRATE 25 MG PO TABS: 25.0000 mg | ORAL_TABLET | Freq: Two times a day (BID) | ORAL | 1 refills | Status: DC

## 2022-08-30 NOTE — Progress Notes (Signed)
Office Note 08/30/2022  CC:  Chief Complaint  Patient presents with   Annual Exam    Fasting CPE   Patient is a 42 y.o. male who is here for 79-month follow-up diabetes, hypertension, and hyperlipidemia. A/P as of last visit: "1) DM 2, great control.  Was on pioglitazone at one point but got off when A1c dropped significantly.   POC Hba1c today is 5.7%. Urine microalb/cr today.   #2, hypertension.  Well-controlled on lisinopril 10 mg a day and Lopressor 25 mg twice a day.  Electrolytes and creatinine today.   3.  Mixed hyperlipidemia.  His main issue has been very high triglycerides. Followed by Dr. Rennis Golden with advanced lipid clinic. Currently on gemfibrozil 600 mg twice a day and Vascepa 2 g twice a day, and rosuvastatin 5 mg a day.  He has follow-up with Dr. Rennis Golden on 04/19/2022. Hepatic panel today."  INTERIM HX: He is feeling well. He tries to eat a good diet and is very active but no formal exercise. No home glucose or blood pressure monitoring.  Most recent follow-up with Dr. Rennis Golden was 04/19/22: His rosuvastatin was increased to 10 mg daily and he remains on the gemfibrozil and Vascepa.  PMP AWARE reviewed today: most recent rx for clonazepam was filled 12/25/2021, # 45, rx Dr. Thedore Mins. No red flags.  ROS as above, plus--> no fevers, no CP, no SOB, no wheezing, no cough, no dizziness, no HAs, no rashes, no melena/hematochezia.  No polyuria or polydipsia.  No myalgias or arthralgias.  No focal weakness, paresthesias, or tremors.  No acute vision or hearing abnormalities.  No dysuria or unusual/new urinary urgency or frequency.  No recent changes in lower legs. No n/v/d or abd pain.  No palpitations.    Past Medical History:  Diagnosis Date   ADHD (attention deficit hyperactivity disorder)    Anxiety    Bilateral carpal tunnel syndrome    transient relief from inj by ortho 04/2020; NCS confirmed bilat CTS->surgery planned as of 05/2020 ortho f/u   Bipolar disorder  (HCC)    Cervical radiculopathy    Diabetes mellitus (HCC) 08/2020   Aug 2022->fasting gluc 130, Hba1c 7.6%   Dizziness    lightheaded   Food allergy    pineapple   GERD (gastroesophageal reflux disease)    Headache syndrome    Hepatic steatosis    u/s and CT confirmed 2020--no cirrhosis.   Hypertension    Hypertriglyceridemia    Very high (>800 while on vascepa and rosuva 06/2019)->rosuva and vascepa d/c'd and gemfibrozil started, also referred pt to advanced lipid clinic.   IBS (irritable bowel syndrome)    D.  bentyl helping ([facility])   Latex allergy    Low back pain    Mixed hyperlipidemia    OSA on CPAP 10/16/2016   Tremors of nervous system    arms, dx'd as lasting side effect from lithium use in remote past: psych has him on amantadine for this.   Vertigo     Past Surgical History:  Procedure Laterality Date   CARPAL TUNNEL RELEASE Left    L 05/2020.  Plan for R side to be done.   CARPAL TUNNEL RELEASE Right    07/2020   CHOLECYSTECTOMY N/A 12/26/2016   Procedure: LAPAROSCOPIC CHOLECYSTECTOMY;  Surgeon: Franky Macho, MD;  Location: [facility];  Service: General;  Laterality: N/A;   KNEE SURGERY Right 1997   arthroscopic   polysomnogram  09/2016   Mild/mod OSA->CPAP (Dr. Lestine Mount)  WISDOM TOOTH EXTRACTION  1997    Family History  Adopted: Yes  Problem Relation Age of Onset   High blood pressure Mother    Seizures Mother     Social History   Socioeconomic History   Marital status: Divorced    Spouse name: Not on file   Number of children: 0   Years of education: 13   Highest education level: Not on file  Occupational History   Not on file  Tobacco Use   Smoking status: Former    Current packs/day: 1.00    Average packs/day: 1 pack/day for 10.0 years (10.0 ttl pk-yrs)    Types: Cigarettes   Smokeless tobacco: Former    Types: Snuff   Tobacco comments:    quit Nov 2018  Vaping Use   Vaping status: Never Used  Substance and Sexual Activity    Alcohol use: Yes    Alcohol/week: 1.0 standard drink of alcohol    Types: 1 Cans of beer per week    Comment: heavy in past, short period of time in 20s.; 05/24/20 occ   Drug use: No    Comment: Quit 1996   Sexual activity: Not Currently  Other Topics Concern   Not on file  Social History Narrative   Divorced, no children.   Educ: college at [employer]   Patient lives at home with his father and unemployed.  Disability for psych reasons.   Education some college.   Both handed.   Caffeine three cups daily soda and coffee.   Alc: occ beer (remote hx of alc abuse, marijuana, acid--no IV drugs) in 2005-2006, surround his problems coping with GF's death.   Tobacco: former cig smoker, 20 pack-yr hx , quit 2018.   Social Determinants of Health   Financial Resource Strain: Low Risk  (02/26/2022)   Overall Financial Resource Strain (CARDIA)    Difficulty of Paying Living Expenses: Not hard at all  Food Insecurity: No Food Insecurity (02/26/2022)   Hunger Vital Sign    Worried About Running Out of Food in the Last Year: Never true    Ran Out of Food in the Last Year: Never true  Transportation Needs: No Transportation Needs (02/26/2022)   PRAPARE - Administrator, Civil Service (Medical): No    Lack of Transportation (Non-Medical): No  Physical Activity: Sufficiently Active (02/26/2022)   Exercise Vital Sign    Days of Exercise per Week: 5 days    Minutes of Exercise per Session: 120 min  Stress: No Stress Concern Present (02/26/2022)   [employer] of Occupational Health - Occupational Stress Questionnaire    Feeling of Stress : Not at all  Social Connections: Socially Isolated (02/26/2022)   Social Connection and Isolation Panel [NHANES]    Frequency of Communication with Friends and Family: Once a week    Frequency of Social Gatherings with Friends and Family: More than three times a week    Attends Religious Services: Never    Database administrator or Organizations: No     Attends Banker Meetings: Never    Marital Status: Divorced  Catering manager Violence: Not At Risk (02/26/2022)   Humiliation, Afraid, Rape, and Kick questionnaire    Fear of Current or Ex-Partner: No    Emotionally Abused: No    Physically Abused: No    Sexually Abused: No    Outpatient Medications Prior to Visit  Medication Sig Dispense Refill   amantadine (SYMMETREL) 100 MG capsule Take 1  capsule (100 mg total) by mouth 2 (two) times daily. 60 capsule 0   ARIPiprazole (ABILIFY) 15 MG tablet Take 7.5 mg by mouth 2 (two) times daily.     clonazePAM (KLONOPIN) 0.5 MG tablet Take 0.25-0.5 mg by mouth 2 (two) times daily. Takes 1/2 tablet in am and 1 tablet at bedtime     fluticasone (FLONASE) 50 MCG/ACT nasal spray INSTILL 2 SPRAYS INTO EACH NOSTRIL DAILY. 16 g 3   gemfibrozil (LOPID) 600 MG tablet TAKE 1 TABLET TWICE DAILY BEFORE MEALS 180 tablet 3   lisinopril (ZESTRIL) 10 MG tablet Take 1 tablet (10 mg total) by mouth daily. 90 tablet 0   Multiple Vitamin (MULTIVITAMIN) tablet Take 1 tablet by mouth daily.     pantoprazole (PROTONIX) 20 MG tablet TAKE ONE TABLET BY MOUTH ONCE DAILY. 30 tablet 4   rosuvastatin (CRESTOR) 10 MG tablet Take 1 tablet (10 mg total) by mouth daily. 90 tablet 3   VASCEPA 1 g capsule TAKE 2 CAPSULES TWICE DAILY 360 capsule 3   ZOLOFT 100 MG tablet Take 200 mg by mouth daily.     metoprolol tartrate (LOPRESSOR) 25 MG tablet Take 1 tablet (25 mg total) by mouth 2 (two) times daily. 7 tablet 0   dicyclomine (BENTYL) 10 MG capsule TAKE 1 CAPSULE BY MOUTH 3 TIMES DAILY WITH MEALS AND AT BEDTIME AS NEEDED. HOLD FOR CONSTIPATION. (Patient not taking: Reported on 04/20/2022) 120 capsule 5   fexofenadine (ALLEGRA) 180 MG tablet Take 1 tablet (180 mg total) by mouth daily. 90 tablet 1   No facility-administered medications prior to visit.    Allergies  Allergen Reactions   Pineapple Shortness Of Breath and Swelling   Latex Swelling   Morphine And  Codeine Hives and Itching   Statins     "locks urinary tract up"     PE;    08/30/2022    8:28 AM 04/20/2022    9:51 AM 04/19/2022    2:26 PM  Vitals with BMI  Height 5\' 10"  5\' 9"  5' 9.75"  Weight 260 lbs 3 oz 261 lbs 10 oz 267 lbs  BMI 37.33 38.61 38.57  Systolic 112 139 130  Diastolic 74 79 74  Pulse 70 83 85  Gen: Alert, well appearing.  Patient is oriented to person, place, time, and situation. AFFECT: pleasant, lucid thought and speech. No further exam today  Pertinent labs:  Lab Results  Component Value Date   TSH 1.620 08/29/2021   Lab Results  Component Value Date   WBC 6.7 08/29/2021   HGB 13.3 08/29/2021   HCT 37.5 08/29/2021   MCV 90 08/29/2021   PLT 234 08/29/2021   Lab Results  Component Value Date   CREATININE 0.85 02/26/2022   BUN 17 02/26/2022   NA 138 02/26/2022   K 4.4 slightly hemolyzed; grossly lipemic 02/26/2022   CL 105 02/26/2022   CO2 23 02/26/2022   Lab Results  Component Value Date   ALT 30 02/26/2022   AST 21 02/26/2022   ALKPHOS 60 02/26/2022   BILITOT 0.4 02/26/2022   Lab Results  Component Value Date   CHOL 186 12/01/2021   Lab Results  Component Value Date   HDL 31 (L) 12/01/2021   Lab Results  Component Value Date   LDLCALC 79 12/01/2021   Lab Results  Component Value Date   TRIG 472 (H) 12/01/2021   Lab Results  Component Value Date   CHOLHDL 6.0 (H) 12/01/2021   Lab Results  Component Value Date   HGBA1C 5.7 (A) 08/30/2022   HGBA1C 5.7 08/30/2022   HGBA1C 5.7 08/30/2022   HGBA1C 5.7 08/30/2022   ASSESSMENT AND PLAN:   DM 2, great control.  Was on pioglitazone at one point but got off when A1c dropped significantly.   POC Hba1c today is 5.7%. Urine microalb/cr today.   #2, hypertension.  Well-controlled on lisinopril 10 mg a day and Lopressor 25 mg twice a day.  Electrolytes and creatinine today.   3.  Mixed hyperlipidemia.  His main issue has been very high triglycerides. Followed by Dr. Rennis Golden with  advanced lipid clinic. Currently on gemfibrozil 600 mg twice a day and Vascepa 2 g twice a day, and rosuvastatin 10 mg a day.  He has follow-up with Dr. Rennis Golden on 10/04/2022. Lipid panel and hepatic panel today.  An After Visit Summary was printed and given to the patient.  FOLLOW UP:  Return in about 6 months (around 03/02/2023) for routine chronic illness f/u.  Signed:  Santiago Bumpers, MD           08/30/2022

## 2022-09-18 ENCOUNTER — Telehealth: Payer: Self-pay | Admitting: Pulmonary Disease

## 2022-09-18 NOTE — Telephone Encounter (Signed)
Patient received a bill from [facility], because they were unable to get the needed information from our office. [facility] is in need of an updated script for airesense 10 auto w/ humiair alimate lineair 1 and proof that patient still sees Dr. Craige Cotta. Please advise.

## 2022-09-24 NOTE — Telephone Encounter (Signed)
Please follow up with [facility] regarding patient message. Thank you!

## 2022-09-24 NOTE — Telephone Encounter (Signed)
Patient was seen in March 2024 and now only has Medicare A &  B. There needs to be a new order placed for cpap

## 2022-09-26 ENCOUNTER — Other Ambulatory Visit: Payer: Self-pay

## 2022-09-26 DIAGNOSIS — G4733 Obstructive sleep apnea (adult) (pediatric): Secondary | ICD-10-CM

## 2022-09-26 NOTE — Telephone Encounter (Signed)
I have placed new orders for CPAP machine to go to [facility]. Closing encounter. NFN

## 2022-10-04 ENCOUNTER — Other Ambulatory Visit: Payer: Self-pay | Admitting: Gastroenterology

## 2022-10-04 ENCOUNTER — Other Ambulatory Visit: Payer: Self-pay | Admitting: Family Medicine

## 2022-10-04 ENCOUNTER — Ambulatory Visit (HOSPITAL_BASED_OUTPATIENT_CLINIC_OR_DEPARTMENT_OTHER): Payer: Medicare Other | Admitting: Internal Medicine

## 2022-10-04 ENCOUNTER — Encounter (HOSPITAL_BASED_OUTPATIENT_CLINIC_OR_DEPARTMENT_OTHER): Payer: Self-pay | Admitting: Internal Medicine

## 2022-10-04 VITALS — BP 111/68 | HR 75 | Ht 70.0 in | Wt 264.2 lb

## 2022-10-04 DIAGNOSIS — E781 Pure hyperglyceridemia: Secondary | ICD-10-CM

## 2022-10-04 NOTE — Progress Notes (Signed)
LIPID CLINIC CONSULT NOTE  Chief Complaint:  Follow-up high triglycerides  Primary Care Physician: Jeoffrey Massed, MD  Primary Cardiologist:  None  HPI:  Bobby Jimenez is a 42 y.o. male who is being seen today for the evaluation of high triglycerides at the request of [name], [name], MD.  This is a pleasant 42 year old male is kindly referred for evaluation management of hypertriglyceridemia.  Bobby Jimenez has a longstanding history of elevated triglycerides.  He says his uncle by birth had high triglycerides and is very thin.  He did not know much about his father since he is adopted but his birth mother may have also had high triglycerides.  She is now deceased.  Over a year ago triglycerides were noted at 745, then improved somewhat to 581 after work with a dietitian and more recently 3 months ago were elevated 837.  Direct LDL was 53 and hemoglobin E9B was 5.6.  He has no coronary disease that is notable.  He denies any history of pancreatitis.  He is on medications that could alter his triglyceride metabolism, including Abilify which is known to raise triglycerides, as well as this sertraline and to a small extent metoprolol.  He works with a Therapist, sports, Dr. Jannifer Franklin.  He reports very rare alcohol use, no more than 1-2 drinks per week.  02/10/2020  Bobby Jimenez is seen today via telephone follow-up.  Overall he seems to be doing well.  He says he is improved his diet and is more physically active.  He is now working a job that requires more physical activity.  Mind has had continued improvement in his lipids.  Total cholesterol now 168, triglycerides 348 (down from over 800, approximately 6 months ago), HDL 29 and LDL of 82.  APO B level has come down from 122-107.  He is not currently on statin therapy.  11/30/2021  Bobby Jimenez is seen today for follow-up.  He has had an interval increase in his lipids.  Triglycerides have gone up from 289-326.  Total cholesterol 195, his  direct LDL now is 105.  As this is increased slightly, I do think he would benefit from addition of a low-dose statin.  He says he is tried statins in the distant past and had issues with urination but seem to improve after stopping it.  He is however willing to trial a low-dose statin.  04/19/2022  Bobby Jimenez is seen today in follow-up.  His last lipids in November showed a small increase in triglycerides.  Total cholesterol however is come down to 186, triglycerides 472, HDL 31 and LDL is 79.  He seems to be tolerating low-dose rosuvastatin without any additional issues.  10/04/2022  Bobby Jimenez returns today for follow-up of high triglycerides.  He did have recent repeat labs which showed a direct LDL of 76, total cholesterol 156, triglycerides 360 and HDL 38.  This is actually the lowest his triglycerides have been in some time.  PMHx:  Past Medical History:  Diagnosis Date   ADHD (attention deficit hyperactivity disorder)    Anxiety    Bilateral carpal tunnel syndrome    transient relief from inj by ortho 04/2020; NCS confirmed bilat CTS->surgery planned as of 05/2020 ortho f/u   Bipolar disorder (HCC)    Cervical radiculopathy    Diabetes mellitus (HCC) 08/2020   Aug 2022->fasting gluc 130, Hba1c 7.6%   Dizziness    lightheaded   Food allergy    pineapple   GERD (gastroesophageal reflux disease)  Headache syndrome    Hepatic steatosis    u/s and CT confirmed 2020--no cirrhosis.   Hypertension    Hypertriglyceridemia    Very high (>800 while on vascepa and rosuva 06/2019)->rosuva and vascepa d/c'd and gemfibrozil started, also referred pt to advanced lipid clinic.   IBS (irritable bowel syndrome)    D.  bentyl helping ([facility])   Latex allergy    Low back pain    Mixed hyperlipidemia    OSA on CPAP 10/16/2016   Tremors of nervous system    arms, dx'd as lasting side effect from lithium use in remote past: psych has him on amantadine for this.   Vertigo      Past Surgical History:  Procedure Laterality Date   CARPAL TUNNEL RELEASE Left    L 05/2020.  Plan for R side to be done.   CARPAL TUNNEL RELEASE Right    07/2020   CHOLECYSTECTOMY N/A 12/26/2016   Procedure: LAPAROSCOPIC CHOLECYSTECTOMY;  Surgeon: Franky Macho, MD;  Location: [facility];  Service: General;  Laterality: N/A;   KNEE SURGERY Right 1997   arthroscopic   polysomnogram  09/2016   Mild/mod OSA->CPAP (Dr. Lestine Mount)   WISDOM TOOTH EXTRACTION  1997    FAMHx:  Family History  Adopted: Yes  Problem Relation Age of Onset   High blood pressure Mother    Seizures Mother     SOCHx:   reports that he has quit smoking. His smoking use included cigarettes. He has a 10 pack-year smoking history. He has quit using smokeless tobacco.  His smokeless tobacco use included snuff. He reports current alcohol use of about 1.0 standard drink of alcohol per week. He reports that he does not use drugs.  ALLERGIES:  Allergies  Allergen Reactions   Pineapple Shortness Of Breath and Swelling   Latex Swelling   Morphine And Codeine Hives and Itching   Statins     "locks urinary tract up"    ROS: Pertinent items noted in HPI and remainder of comprehensive ROS otherwise negative.  HOME MEDS: Current Outpatient Medications on File Prior to Visit  Medication Sig Dispense Refill   amantadine (SYMMETREL) 100 MG capsule Take 1 capsule (100 mg total) by mouth 2 (two) times daily. 60 capsule 0   ARIPiprazole (ABILIFY) 15 MG tablet Take 7.5 mg by mouth 2 (two) times daily.     clonazePAM (KLONOPIN) 0.5 MG tablet Take 0.25-0.5 mg by mouth 2 (two) times daily. Takes 1/2 tablet in am and 1 tablet at bedtime     fexofenadine (ALLEGRA) 180 MG tablet Take 1 tablet (180 mg total) by mouth daily. 90 tablet 1   fluticasone (FLONASE) 50 MCG/ACT nasal spray INSTILL 2 SPRAYS INTO EACH NOSTRIL DAILY. 16 g 3   gemfibrozil (LOPID) 600 MG tablet TAKE 1 TABLET TWICE DAILY BEFORE MEALS 180 tablet 3   lisinopril  (ZESTRIL) 10 MG tablet Take 1 tablet (10 mg total) by mouth daily. 90 tablet 0   metoprolol tartrate (LOPRESSOR) 25 MG tablet Take 1 tablet (25 mg total) by mouth 2 (two) times daily. 180 tablet 1   Multiple Vitamin (MULTIVITAMIN) tablet Take 1 tablet by mouth daily.     pantoprazole (PROTONIX) 20 MG tablet TAKE ONE TABLET BY MOUTH ONCE DAILY. 30 tablet 4   rosuvastatin (CRESTOR) 10 MG tablet Take 1 tablet (10 mg total) by mouth daily. 90 tablet 3   VASCEPA 1 g capsule TAKE 2 CAPSULES TWICE DAILY 360 capsule 3   ZOLOFT 100  MG tablet Take 200 mg by mouth daily.     No current facility-administered medications on file prior to visit.    LABS/IMAGING: No results found for this or any previous visit (from the past 48 hour(s)). No results found.  LIPID PANEL:    Component Value Date/Time   CHOL 156 08/30/2022 0851   CHOL 186 12/01/2021 1143   TRIG 360.0 (H) 08/30/2022 0851   HDL 38.10 (L) 08/30/2022 0851   HDL 31 (L) 12/01/2021 1143   CHOLHDL 4 08/30/2022 0851   VLDL 72.0 (H) 08/30/2022 0851   LDLCALC 79 12/01/2021 1143   LDLDIRECT 76.0 08/30/2022 0851    WEIGHTS: Wt Readings from Last 3 Encounters:  10/04/22 264 lb 3.2 oz (119.8 kg)  08/30/22 260 lb 3.2 oz (118 kg)  04/20/22 261 lb 9.6 oz (118.7 kg)    VITALS: BP 111/68 (BP Location: Left Arm, Patient Position: Sitting, Cuff Size: Large)   Pulse 75   Ht 5\' 10"  (1.778 m)   Wt 264 lb 3.2 oz (119.8 kg)   SpO2 97%   BMI 37.91 kg/m   EXAM: Deferred  EKG: Deferred  ASSESSMENT: Primary hypertriglyceridemia  PLAN: 1.   Bobby Jimenez has had further improvement in triglycerides which are at 360, the lowest they have been in some time.  Would recommend continue his current therapy with rosuvastatin, Vascepa, and gemfibrozil.  Will plan repeat lipids in about 6 months and follow-up afterwards.  Chrystie Nose, MD, [facility], FACP  [facility]  Medical Director of the Advanced Lipid Disorders &  Cardiovascular  Risk Reduction Clinic Diplomate of the American Board of Clinical Lipidology Attending Cardiologist  Direct Dial: [phone]  Fax: [phone]  Website:  [email or web address]  Chrystie Nose 10/04/2022, 2:37 PM

## 2022-10-04 NOTE — Patient Instructions (Signed)
Medication Instructions:  NO CHANGES  *If you need a refill on your cardiac medications before your next appointment, please call your pharmacy*   Lab Work: FASTING lab work in 6 months to check cholesterol If you have labs (blood work) drawn today and your tests are completely normal, you will receive your results only by: MyChart Message (if you have MyChart) OR A paper copy in the mail If you have any lab test that is abnormal or we need to change your treatment, we will call you to review the results.   Follow-Up: At [facility], you and your health needs are our priority.  As part of our continuing mission to provide you with exceptional heart care, we have created designated Provider Care Teams.  These Care Teams include your primary Cardiologist (physician) and Advanced Practice Providers (APPs -  Physician Assistants and Nurse Practitioners) who all work together to provide you with the care you need, when you need it.  We recommend signing up for the patient portal called "MyChart".  Sign up information is provided on this After Visit Summary.  MyChart is used to connect with patients for Virtual Visits (Telemedicine).  Patients are able to view lab/test results, encounter notes, upcoming appointments, etc.  Non-urgent messages can be sent to your provider as well.   To learn more about what you can do with MyChart, go to [email or web address].    Your next appointment:    6 months with Dr. Rennis Golden

## 2022-10-15 ENCOUNTER — Encounter (HOSPITAL_COMMUNITY): Payer: Self-pay | Admitting: Emergency Medicine

## 2022-10-15 ENCOUNTER — Emergency Department (HOSPITAL_COMMUNITY)
Admission: EM | Admit: 2022-10-15 | Discharge: 2022-10-15 | Disposition: A | Discharge status: Home / Self Care | Payer: Medicare Other | Attending: Emergency Medicine | Admitting: Emergency Medicine

## 2022-10-15 ENCOUNTER — Other Ambulatory Visit: Payer: Self-pay

## 2022-10-15 DIAGNOSIS — I10 Essential (primary) hypertension: Secondary | ICD-10-CM | POA: Insufficient documentation

## 2022-10-15 DIAGNOSIS — Z79899 Other long term (current) drug therapy: Secondary | ICD-10-CM | POA: Insufficient documentation

## 2022-10-15 DIAGNOSIS — Z91040 Latex allergy status: Secondary | ICD-10-CM | POA: Insufficient documentation

## 2022-10-15 DIAGNOSIS — H60391 Other infective otitis externa, right ear: Secondary | ICD-10-CM | POA: Diagnosis not present

## 2022-10-15 DIAGNOSIS — H9201 Otalgia, right ear: Secondary | ICD-10-CM | POA: Diagnosis present

## 2022-10-15 MED ORDER — AMOXICILLIN 250 MG PO CAPS
500.0000 mg | ORAL_CAPSULE | Freq: Once | ORAL | Status: AC
  Administered 2022-10-15: 500 mg via ORAL
  Filled 2022-10-15: qty 2

## 2022-10-15 MED ORDER — IBUPROFEN 600 MG PO TABS: 600.0000 mg | ORAL_TABLET | Freq: Three times a day (TID) | ORAL | 0 refills | Status: DC

## 2022-10-15 MED ORDER — AMOXICILLIN 500 MG PO CAPS: 500.0000 mg | ORAL_CAPSULE | Freq: Three times a day (TID) | ORAL | 0 refills | Status: DC

## 2022-10-15 MED ORDER — NEOMYCIN-POLYMYXIN-HC 1 % OT SOLN
3.0000 [drp] | Freq: Once | OTIC | Status: AC
  Administered 2022-10-15: 3 [drp] via OTIC
  Filled 2022-10-15: qty 10

## 2022-10-15 MED ORDER — IBUPROFEN 800 MG PO TABS
800.0000 mg | ORAL_TABLET | Freq: Once | ORAL | Status: AC
  Administered 2022-10-15: 800 mg via ORAL
  Filled 2022-10-15: qty 1

## 2022-10-15 NOTE — Discharge Instructions (Signed)
You have a right external ear infection and are therefore being treated with Cortisporin eardrops.  Apply 3 to 4 drops to your right ear when lying down and lay on your side with that ear up for 5 minutes allowing the medicine to completely absorb into your ear canal, do this 3 times daily for the next 7 days.  Because I cannot see your eardrum and with your strong history of middle ear infections you are also being treated with amoxicillin tablets.  Take the entire course of the pills.  Plan follow-up care with Dr. Suszanne Conners if this does not improve your symptoms.

## 2022-10-15 NOTE — ED Triage Notes (Signed)
HOH pt having right ear pain x 2 days, wears bilateral hearing aids.

## 2022-10-15 NOTE — ED Provider Notes (Signed)
EMERGENCY DEPARTMENT AT [facility] Provider Note   CSN: [id number] Arrival date & time: 10/15/22  1213     History  Chief Complaint  Patient presents with   Otalgia    Right Ear    Bobby Jimenez is a 42 y.o. male with a history including hypertension, IBS, anxiety, GERD longstanding history of otitis media with residual hearing loss requiring use of hearing aids presenting for evaluation of right ear pain and fluid sensation within the canal but denies any drainage from the canal.  His symptoms started 2 days ago.  He has been unable to wear his right hearing aid secondary to discomfort of his ear canal.  He has had no fevers or chills, denies sore throat, neck pain, nasal congestion, sinus issues.  He has had no treatment for symptoms prior to arrival.  And with his ENT MD Dr Suszanne Conners but was unable to get a prompt appointment.  The history is provided by the patient.       Home Medications Prior to Admission medications   Medication Sig Start Date End Date Taking? Authorizing Provider  amoxicillin (AMOXIL) 500 MG capsule Take 1 capsule (500 mg total) by mouth 3 (three) times daily. 10/15/22  Yes Earma Nicolaou, Raynelle Fanning, PA-C  ibuprofen (ADVIL) 600 MG tablet Take 1 tablet (600 mg total) by mouth 3 (three) times daily. 10/15/22  Yes Shuntel Fishburn, Raynelle Fanning, PA-C  amantadine (SYMMETREL) 100 MG capsule Take 1 capsule (100 mg total) by mouth 2 (two) times daily. 03/02/21   [name], [name], MD  ARIPiprazole (ABILIFY) 15 MG tablet Take 7.5 mg by mouth 2 (two) times daily. 11/06/21   [provider]  clonazePAM (KLONOPIN) 0.5 MG tablet Take 0.25-0.5 mg by mouth 2 (two) times daily. Takes 1/2 tablet in am and 1 tablet at bedtime    [provider]  fexofenadine (ALLEGRA) 180 MG tablet Take 1 tablet (180 mg total) by mouth daily. 07/09/19 10/04/22  [name], [name], MD  fluticasone (FLONASE) 50 MCG/ACT nasal spray INSTILL 2 SPRAYS INTO EACH NOSTRIL DAILY. 10/04/22   [name], [name], MD  gemfibrozil (LOPID) 600 MG tablet TAKE 1 TABLET TWICE DAILY BEFORE MEALS 02/02/22   Hilty, Lisette Abu, MD  lisinopril (ZESTRIL) 10 MG tablet Take 1 tablet (10 mg total) by mouth daily. 07/27/22   [name], [name], MD  metoprolol tartrate (LOPRESSOR) 25 MG tablet Take 1 tablet (25 mg total) by mouth 2 (two) times daily. 08/30/22   [name], [name], MD  Multiple Vitamin (MULTIVITAMIN) tablet Take 1 tablet by mouth daily.    [provider]  pantoprazole (PROTONIX) 20 MG tablet TAKE ONE TABLET BY MOUTH ONCE DAILY. 10/05/22   Letta Median, PA-C  rosuvastatin (CRESTOR) 10 MG tablet Take 1 tablet (10 mg total) by mouth daily. 05/15/22 05/10/23  Chrystie Nose, MD  VASCEPA 1 g capsule TAKE 2 CAPSULES TWICE DAILY 02/02/22   Hilty, Lisette Abu, MD  ZOLOFT 100 MG tablet Take 200 mg by mouth daily. 08/11/14   [provider]      Allergies    Pineapple, Latex, Morphine and codeine, and Statins    Review of Systems   Review of Systems  Constitutional:  Negative for chills and fever.  HENT:  Positive for ear pain. Negative for congestion, rhinorrhea, sinus pressure, sinus pain and sore throat.   Eyes: Negative.   Respiratory: Negative.    Cardiovascular: Negative.   Gastrointestinal: Negative.  Negative for abdominal pain.  Genitourinary: Negative.   Musculoskeletal: Negative.   Skin: Negative.  Negative for rash and wound.  Neurological:  Negative for dizziness, weakness, light-headedness, numbness and headaches.  Psychiatric/Behavioral: Negative.    All other systems reviewed and are negative.   Physical Exam Updated Vital Signs BP 119/82 (BP Location: Right Arm)   Pulse 91   Temp 98.3 F (36.8 C) (Oral)   Resp 18   Ht 5\' 9"  (1.753 m)   Wt 119.7 kg   SpO2 100%   BMI 38.99 kg/m  Physical Exam Constitutional:      Appearance: He is well-developed.  HENT:     Head: Normocephalic and atraumatic.     Right Ear: Swelling present.     Left Ear: Tympanic membrane and  ear canal normal.     Ears:     Comments: Pain with ear traction.  Unable to visualize TM due to canal edema but not fully occluded.  No fluid appreciated in external canal.  Mild erythema distal canal.    Nose: Mucosal edema present. No rhinorrhea.     Mouth/Throat:     Mouth: Mucous membranes are moist.     Pharynx: Oropharynx is clear. Uvula midline. No oropharyngeal exudate or posterior oropharyngeal erythema.     Tonsils: No tonsillar abscesses.  Eyes:     Conjunctiva/sclera: Conjunctivae normal.  Cardiovascular:     Rate and Rhythm: Normal rate.  Pulmonary:     Effort: Pulmonary effort is normal.  Musculoskeletal:        General: Normal range of motion.  Skin:    General: Skin is warm and dry.     Findings: No rash.  Neurological:     Mental Status: He is alert and oriented to person, place, and time.     ED Results / Procedures / Treatments   Labs (all labs ordered are listed, but only abnormal results are displayed) Labs Reviewed - No data to display  EKG None  Radiology No results found.  Procedures Procedures    Medications Ordered in ED Medications  NEOMYCIN-POLYMYXIN-HYDROCORTISONE (CORTISPORIN) OTIC (EAR) solution 3 drop (3 drops Right EAR Given 10/15/22 1345)  ibuprofen (ADVIL) tablet 800 mg (800 mg Oral Given 10/15/22 1345)  amoxicillin (AMOXIL) capsule 500 mg (500 mg Oral Given 10/15/22 1345)    ED Course/ Medical Decision Making/ A&P                                 Medical Decision Making Pts exam and hx consistent with otitis externa.  Unable to visualize TM so will cover with both topical cortisporin and PO amoxil. Ibuprofen for pain. No indication for ear wick, not fully occluded.  Advised close f/u if sx persist or worsen.  No exam findings suggesting malignant otitis.   Risk Prescription drug management.           Final Clinical Impression(s) / ED Diagnoses Final diagnoses:  Other infective acute otitis externa of right ear     Rx / DC Orders ED Discharge Orders          Ordered    amoxicillin (AMOXIL) 500 MG capsule  3 times daily        10/15/22 1334    ibuprofen (ADVIL) 600 MG tablet  3 times daily        10/15/22 1334              Burgess Amor, PA-C 10/15/22 1525  Loetta Rough, MD 10/15/22 [phone]

## 2022-11-05 ENCOUNTER — Telehealth: Payer: Self-pay | Admitting: Family Medicine

## 2022-11-05 DIAGNOSIS — I10 Essential (primary) hypertension: Secondary | ICD-10-CM

## 2022-11-05 MED ORDER — LISINOPRIL 10 MG PO TABS
10.0000 mg | ORAL_TABLET | Freq: Every day | ORAL | 1 refills | Status: DC
Start: 2022-11-05 — End: 2023-04-29

## 2022-11-05 NOTE — Telephone Encounter (Signed)
Okay lisinopril prescribed

## 2022-11-05 NOTE — Telephone Encounter (Signed)
Prescription Request  11/05/2022  LOV: 08/30/2022 Please send to [facility], he has been out of medication for 1 week now.   What is the name of the medication or equipment? lisinopril (ZESTRIL) 10 MG tablet   Have you contacted your pharmacy to request a refill? Yes   Which pharmacy would you like this sent to?  [facility] - [place], [place] - [address] [place] [address] Phone: [phone] Fax: [phone]  [facility] - [place], [place] - [address] [place] [address] Phone: [phone] Fax: [phone]    Patient notified that their request is being sent to the clinical staff for review and that they should receive a response within 2 business days.   Please advise at Mobile [phone] (mobile)

## 2022-11-20 ENCOUNTER — Other Ambulatory Visit: Payer: Self-pay | Admitting: Family Medicine

## 2022-11-28 ENCOUNTER — Other Ambulatory Visit: Payer: Self-pay

## 2022-11-28 MED ORDER — FLUTICASONE PROPIONATE 50 MCG/ACT NA SUSP: NASAL | 3 refills | Status: DC

## 2023-01-04 ENCOUNTER — Telehealth: Payer: Self-pay | Admitting: Internal Medicine

## 2023-01-04 MED ORDER — ICOSAPENT ETHYL 1 G PO CAPS: 2.0000 g | ORAL_CAPSULE | Freq: Two times a day (BID) | ORAL | 2 refills | Status: DC

## 2023-01-04 MED ORDER — GEMFIBROZIL 600 MG PO TABS: ORAL_TABLET | ORAL | 2 refills | Status: DC

## 2023-01-04 NOTE — Telephone Encounter (Signed)
 RX sent to requested Pharmacy

## 2023-01-04 NOTE — Telephone Encounter (Signed)
 *  STAT* If patient is at the pharmacy, call can be transferred to refill team.   1. Which medications need to be refilled? (please list name of each medication and dose if known)   VASCEPA 1 g capsule    gemfibrozil (LOPID) 600 MG tablet    2. Would you like to learn more about the convenience, safety, & potential cost savings by using the [facility]?    3. Are you open to using the [facility] (Type [facility].  4. Which pharmacy/location (including street and city if local pharmacy) is medication to be sent to? [facility] - [place], [place] - [address]    5. Do they need a 30 day or 90 day supply? 90 days

## 2023-01-11 ENCOUNTER — Telehealth: Payer: Self-pay | Admitting: Family Medicine

## 2023-01-11 NOTE — Telephone Encounter (Signed)
Patient states he needs the CPAP compliance  documentation of his March 2024 visit with Dr. Craige Cotta resent to [facility].  Patient call back [phone].

## 2023-01-14 ENCOUNTER — Ambulatory Visit: Payer: Medicare Other | Admitting: Family Medicine

## 2023-01-14 ENCOUNTER — Encounter: Payer: Self-pay | Admitting: Family Medicine

## 2023-01-14 VITALS — BP 115/74 | HR 83 | Wt 254.6 lb

## 2023-01-14 DIAGNOSIS — H66005 Acute suppurative otitis media without spontaneous rupture of ear drum, recurrent, left ear: Secondary | ICD-10-CM

## 2023-01-14 DIAGNOSIS — Z23 Encounter for immunization: Secondary | ICD-10-CM | POA: Diagnosis not present

## 2023-01-14 DIAGNOSIS — Z9889 Other specified postprocedural states: Secondary | ICD-10-CM | POA: Diagnosis not present

## 2023-01-14 DIAGNOSIS — Z9622 Myringotomy tube(s) status: Secondary | ICD-10-CM | POA: Diagnosis not present

## 2023-01-14 MED ORDER — NEOMYCIN-POLYMYXIN-HC 3.5-10000-1 OT SOLN: 3.0000 [drp] | Freq: Three times a day (TID) | OTIC | 0 refills | Status: AC

## 2023-01-14 MED ORDER — AMOXICILLIN 875 MG PO TABS: 875.0000 mg | ORAL_TABLET | Freq: Two times a day (BID) | ORAL | 0 refills | Status: AC

## 2023-01-14 NOTE — Progress Notes (Signed)
OFFICE VISIT  01/14/2023  CC:  Chief Complaint  Patient presents with   Ear Drainage    L ear for 1 week; Denies pain, drainage, pressure behind ear. Could not get into see ENT.     Patient is a 42 y.o. male who presents for left ear drainage.  HPI: About a week ago started having malodorous drainage from left ear.  No pain, no hearing changes. No URI symptoms.  No fever.  He does have a history of recurrent otitis media and has had tubes in both ears at 1 point in time.  Past Medical History:  Diagnosis Date   ADHD (attention deficit hyperactivity disorder)    Anxiety    Bilateral carpal tunnel syndrome    transient relief from inj by ortho 04/2020; NCS confirmed bilat CTS->surgery planned as of 05/2020 ortho f/u   Bipolar disorder (HCC)    Cervical radiculopathy    Diabetes mellitus (HCC) 08/2020   Aug 2022->fasting gluc 130, Hba1c 7.6%   Dizziness    lightheaded   Food allergy    pineapple   GERD (gastroesophageal reflux disease)    Headache syndrome    Hepatic steatosis    u/s and CT confirmed 2020--no cirrhosis.   Hypertension    Hypertriglyceridemia    Very high (>800 while on vascepa and rosuva 06/2019)->rosuva and vascepa d/c'd and gemfibrozil started, also referred pt to advanced lipid clinic.   IBS (irritable bowel syndrome)    D.  bentyl helping ([facility])   Latex allergy    Low back pain    Mixed hyperlipidemia    OSA on CPAP 10/16/2016   Tremors of nervous system    arms, dx'd as lasting side effect from lithium use in remote past: psych has him on amantadine for this.   Vertigo     Past Surgical History:  Procedure Laterality Date   CARPAL TUNNEL RELEASE Left    L 05/2020.  Plan for R side to be done.   CARPAL TUNNEL RELEASE Right    07/2020   CHOLECYSTECTOMY N/A 12/26/2016   Procedure: LAPAROSCOPIC CHOLECYSTECTOMY;  Surgeon: Franky Macho, MD;  Location: [facility];  Service: General;  Laterality: N/A;   KNEE SURGERY Right 1997    arthroscopic   polysomnogram  09/2016   Mild/mod OSA->CPAP (Dr. Lestine Mount)   WISDOM TOOTH EXTRACTION  1997    Outpatient Medications Prior to Visit  Medication Sig Dispense Refill   amantadine (SYMMETREL) 100 MG capsule Take 1 capsule (100 mg total) by mouth 2 (two) times daily. 60 capsule 0   ARIPiprazole (ABILIFY) 15 MG tablet Take 7.5 mg by mouth 2 (two) times daily.     clonazePAM (KLONOPIN) 0.5 MG tablet Take 0.25-0.5 mg by mouth 2 (two) times daily. Takes 1/2 tablet in am and 1 tablet at bedtime     fluticasone (FLONASE) 50 MCG/ACT nasal spray INSTILL 2 SPRAYS INTO EACH NOSTRIL DAILY. 16 g 3   gemfibrozil (LOPID) 600 MG tablet TAKE 1 TABLET TWICE DAILY BEFORE MEALS 180 tablet 2   icosapent Ethyl (VASCEPA) 1 g capsule Take 2 capsules (2 g total) by mouth 2 (two) times daily. 360 capsule 2   lisinopril (ZESTRIL) 10 MG tablet Take 1 tablet (10 mg total) by mouth daily. 90 tablet 1   metoprolol tartrate (LOPRESSOR) 25 MG tablet Take 1 tablet (25 mg total) by mouth 2 (two) times daily. 180 tablet 1   Multiple Vitamin (MULTIVITAMIN) tablet Take 1 tablet by mouth daily.  pantoprazole (PROTONIX) 20 MG tablet TAKE ONE TABLET BY MOUTH ONCE DAILY. 30 tablet 5   rosuvastatin (CRESTOR) 10 MG tablet Take 1 tablet (10 mg total) by mouth daily. 90 tablet 3   ZOLOFT 100 MG tablet Take 200 mg by mouth daily.     fexofenadine (ALLEGRA) 180 MG tablet Take 1 tablet (180 mg total) by mouth daily. 90 tablet 1   ibuprofen (ADVIL) 600 MG tablet Take 1 tablet (600 mg total) by mouth 3 (three) times daily. (Patient not taking: Reported on 01/14/2023) 9 tablet 0   amoxicillin (AMOXIL) 500 MG capsule Take 1 capsule (500 mg total) by mouth 3 (three) times daily. (Patient not taking: Reported on 01/14/2023) 21 capsule 0   No facility-administered medications prior to visit.    Allergies  Allergen Reactions   Pineapple Shortness Of Breath and Swelling   Latex Swelling   Morphine And Codeine Hives and Itching    Statins     "locks urinary tract up"    Review of Systems  As per HPI  PE:    01/14/2023    8:16 AM 10/15/2022   12:23 PM 10/15/2022   12:22 PM  Vitals with BMI  Height   5\' 9"   Weight 254 lbs 10 oz  264 lbs  BMI   38.97  Systolic 115 119   Diastolic 74 82   Pulse 83 91      Physical Exam  Gen: Alert, well appearing.  Patient is oriented to person, place, time, and situation. Right ear: No erythema.  EAC without swelling or drainage.  Tympanic membrane intact and without erythema.  No tympanostomy tube is present. Left ear: No tenderness or erythema or swelling.  Clear drainage in the proximal aspect of the EAC that appears to be coming from a tympanostomy tube.  No erythema.  LABS:  Last metabolic panel Lab Results  Component Value Date   GLUCOSE 104 (H) 08/30/2022   NA 137 08/30/2022   K 4.1 08/30/2022   CL 102 08/30/2022   CO2 26 08/30/2022   BUN 16 08/30/2022   CREATININE 0.83 08/30/2022   GFR 107.11 08/30/2022   CALCIUM 9.8 08/30/2022   PROT 7.4 08/30/2022   ALBUMIN 4.8 08/30/2022   BILITOT 0.4 08/30/2022   ALKPHOS 67 08/30/2022   AST 19 08/30/2022   ALT 27 08/30/2022   ANIONGAP 9 01/25/2021   Last hemoglobin A1c Lab Results  Component Value Date   HGBA1C 5.7 (A) 08/30/2022   HGBA1C 5.7 08/30/2022   HGBA1C 5.7 08/30/2022   HGBA1C 5.7 08/30/2022   IMPRESSION AND PLAN:  Acute otitis media, patent tympanostomy tube. Will treat with amoxicillin 875 twice daily x 7 days as well as Cortisporin otic 3 drops 3 times daily x 3 days. If no improvement then will do trial of treatment for fungal OE.  An After Visit Summary was printed and given to the patient.  FOLLOW UP: No follow-ups on file.  Signed:  Santiago Bumpers, MD           01/14/2023

## 2023-02-26 ENCOUNTER — Other Ambulatory Visit: Payer: Self-pay

## 2023-02-26 ENCOUNTER — Other Ambulatory Visit: Payer: Self-pay | Admitting: Family Medicine

## 2023-02-26 MED ORDER — METOPROLOL TARTRATE 25 MG PO TABS: 25.0000 mg | ORAL_TABLET | Freq: Two times a day (BID) | ORAL | 0 refills | Status: DC

## 2023-03-04 ENCOUNTER — Encounter: Payer: Self-pay | Admitting: Adult Health

## 2023-03-04 ENCOUNTER — Ambulatory Visit (INDEPENDENT_AMBULATORY_CARE_PROVIDER_SITE_OTHER): Payer: Medicare Other | Admitting: Adult Health

## 2023-03-04 ENCOUNTER — Telehealth: Payer: Self-pay | Admitting: *Deleted

## 2023-03-04 VITALS — BP 122/74 | HR 89

## 2023-03-04 DIAGNOSIS — G4733 Obstructive sleep apnea (adult) (pediatric): Secondary | ICD-10-CM | POA: Insufficient documentation

## 2023-03-04 NOTE — Progress Notes (Signed)
@Patient  ID: Bobby Jimenez, male    DOB: 1980/05/21, 43 y.o.   MRN: [id number]  Chief Complaint  Patient presents with   Follow-up    Referring provider: Jeoffrey Massed, MD  HPI: 43 year old male followed for severe obstructive sleep apnea  TEST/EVENTS : ' PSG 10/16/16 >> AHI 13.1, SpO2 low 88% PSG 10/30/17 >> AHI 32.1, SpO2 low 88%; CPAP 8 cm H2O CPAP 03/21/22 to 04/18/12 >> used on 26 of 30 nights with average 6 hrs 53 min.  Average AHI 5.9 with CPAP 12 cm H2O  03/04/2023 Follow up ; OSA  Patient presents for 1 year follow-up.  Patient has underlying severe obstructive sleep apnea.  Patient is on CPAP at bedtime.  Patient says he wears a CPAP most every night.  Patient works very different shifts at work and only sleeps for about 4 to 5 hours at a time.  We discussed a healthy sleep regimen.  CPAP download shows good compliance with 87% usage.  Daily average usage at 6 hours.  Patient is on auto CPAP 5 to 15 cm H2O.  AHI 20.8/hour.  Patient recently got a new CPAP machine last year.  He is on auto CPAP 5-15.  Previously last year was on CPAP 12 cm H2O.  At that time download showed AHI at 5.9.  Patient says he is all new supplies  Discussed Inspire device with patient education given.   Allergies  Allergen Reactions   Pineapple Shortness Of Breath and Swelling   Latex Swelling   Morphine And Codeine Hives and Itching   Statins     "locks urinary tract up"    Immunization History  Administered Date(s) Administered   Influenza, Seasonal, Injecte, Preservative Fre 01/14/2023   Influenza,inj,Quad PF,6+ Mos 12/01/2020   Moderna Covid-19 Vaccine Bivalent Booster 95yrs & up 12/14/2020   Moderna SARS-COV2 Booster Vaccination 09/02/2020   Moderna Sars-Covid-2 Vaccination 05/28/2019, 06/25/2019   PNEUMOCOCCAL CONJUGATE-20 12/01/2020   Pneumococcal Polysaccharide-23 11/29/2016   Tdap 01/29/2013, 09/04/2020    Past Medical History:  Diagnosis Date   ADHD (attention deficit  hyperactivity disorder)    Anxiety    Bilateral carpal tunnel syndrome    transient relief from inj by ortho 04/2020; NCS confirmed bilat CTS->surgery planned as of 05/2020 ortho f/u   Bipolar disorder (HCC)    Cervical radiculopathy    Diabetes mellitus (HCC) 08/2020   Aug 2022->fasting gluc 130, Hba1c 7.6%   Dizziness    lightheaded   Food allergy    pineapple   GERD (gastroesophageal reflux disease)    Headache syndrome    Hepatic steatosis    u/s and CT confirmed 2020--no cirrhosis.   Hypertension    Hypertriglyceridemia    Very high (>800 while on vascepa and rosuva 06/2019)->rosuva and vascepa d/c'd and gemfibrozil started, also referred pt to advanced lipid clinic.   IBS (irritable bowel syndrome)    D.  bentyl helping ([facility])   Latex allergy    Low back pain    Mixed hyperlipidemia    OSA on CPAP 10/16/2016   Tremors of nervous system    arms, dx'd as lasting side effect from lithium use in remote past: psych has him on amantadine for this.   Vertigo     Tobacco History: Social History   Tobacco Use  Smoking Status Former   Current packs/day: 1.00   Average packs/day: 1 pack/day for 10.0 years (10.0 ttl pk-yrs)   Types: Cigarettes  Smokeless Tobacco Former  Types: Snuff  Tobacco Comments   quit Nov 2018.  Started back in July of 2024, smoking ,1/2 ppd.  03/04/2023 hfb   Counseling given: Not Answered Tobacco comments: quit Nov 2018.  Started back in July of 2024, smoking ,1/2 ppd.  03/04/2023 hfb   Outpatient Medications Prior to Visit  Medication Sig Dispense Refill   amantadine (SYMMETREL) 100 MG capsule Take 1 capsule (100 mg total) by mouth 2 (two) times daily. 60 capsule 0   ARIPiprazole (ABILIFY) 15 MG tablet Take 7.5 mg by mouth 2 (two) times daily.     clonazePAM (KLONOPIN) 0.5 MG tablet Take 0.25-0.5 mg by mouth 2 (two) times daily. Takes 1/2 tablet in am and 1 tablet at bedtime     fluticasone (FLONASE) 50 MCG/ACT nasal spray INSTILL 2  SPRAYS INTO EACH NOSTRIL DAILY. 16 g 3   gemfibrozil (LOPID) 600 MG tablet TAKE 1 TABLET TWICE DAILY BEFORE MEALS 180 tablet 2   icosapent Ethyl (VASCEPA) 1 g capsule Take 2 capsules (2 g total) by mouth 2 (two) times daily. 360 capsule 2   lisinopril (ZESTRIL) 10 MG tablet Take 1 tablet (10 mg total) by mouth daily. 90 tablet 1   metoprolol tartrate (LOPRESSOR) 25 MG tablet Take 1 tablet (25 mg total) by mouth 2 (two) times daily. 60 tablet 0   Multiple Vitamin (MULTIVITAMIN) tablet Take 1 tablet by mouth daily.     pantoprazole (PROTONIX) 20 MG tablet TAKE ONE TABLET BY MOUTH ONCE DAILY. 30 tablet 5   rosuvastatin (CRESTOR) 10 MG tablet Take 1 tablet (10 mg total) by mouth daily. 90 tablet 3   ZOLOFT 100 MG tablet Take 200 mg by mouth daily.     fexofenadine (ALLEGRA) 180 MG tablet Take 1 tablet (180 mg total) by mouth daily. 90 tablet 1   ibuprofen (ADVIL) 600 MG tablet Take 1 tablet (600 mg total) by mouth 3 (three) times daily. (Patient not taking: Reported on 03/04/2023) 9 tablet 0   No facility-administered medications prior to visit.     Review of Systems:   Constitutional:   No  weight loss, night sweats,  Fevers, chills, fatigue, or  lassitude.  HEENT:   No headaches,  Difficulty swallowing,  Tooth/dental problems, or  Sore throat,                No sneezing, itching, ear ache, nasal congestion, post nasal drip,   CV:  No chest pain,  Orthopnea, PND, swelling in lower extremities, anasarca, dizziness, palpitations, syncope.   GI  No heartburn, indigestion, abdominal pain, nausea, vomiting, diarrhea, change in bowel habits, loss of appetite, bloody stools.   Resp: No shortness of breath with exertion or at rest.  No excess mucus, no productive cough,  No non-productive cough,  No coughing up of blood.  No change in color of mucus.  No wheezing.  No chest wall deformity  Skin: no rash or lesions.  GU: no dysuria, change in color of urine, no urgency or frequency.  No flank pain,  no hematuria   MS:  No joint pain or swelling.  No decreased range of motion.  No back pain.    Physical Exam  BP 122/74 (BP Location: Left Arm, Patient Position: Sitting, Cuff Size: Large)   Pulse 89   SpO2 98%   GEN: A/Ox3; pleasant , NAD, well nourished    HEENT:  [place]/AT,  NOSE-clear, THROAT-clear, no lesions, no postnasal drip or exudate noted.   NECK:  Supple w/ fair  ROM; no JVD; normal carotid impulses w/o bruits; no thyromegaly or nodules palpated; no lymphadenopathy.    RESP  Clear  P & A; w/o, wheezes/ rales/ or rhonchi. no accessory muscle use, no dullness to percussion  CARD:  RRR, no m/r/g, no peripheral edema, pulses intact, no cyanosis or clubbing.  GI:   Soft & nt; nml bowel sounds; no organomegaly or masses detected.   Musco: Warm bil, no deformities or joint swelling noted.   Neuro: alert, no focal deficits noted.    Skin: Warm, no lesions or rashes    Lab Results:  CBC   BNP No results found for: "BNP"  ProBNP No results found for: "PROBNP"  Imaging: No results found.  Administration History     None           No data to display          No results found for: "NITRICOXIDE"      Assessment & Plan:   OSA (obstructive sleep apnea) Severe obstructive sleep apnea patient has excellent compliance.  He is encouraged on increasing his nightly sleep regimen to more than 6 hours each night.  He does have a lot of residual events.  Mask is leaking.  Will change to CPAP 12 cm H2O.  See if we can get better control.  If not may need to send for a mask fitting.  CPAP download in 1 month.  - discussed how weight can impact sleep and risk for sleep disordered breathing - discussed options to assist with weight loss: combination of diet modification, cardiovascular and strength training exercises   - had an extensive discussion regarding the adverse health consequences related to untreated sleep disordered breathing - specifically discussed  the risks for hypertension, coronary artery disease, cardiac dysrhythmias, cerebrovascular disease, and diabetes - lifestyle modification discussed   - discussed how sleep disruption can increase risk of accidents, particularly when driving - safe driving practices were discussed        Rubye Oaks, NP 03/04/2023

## 2023-03-04 NOTE — Patient Instructions (Addendum)
Continue on CPAP At bedtime , wear all night long for at least 6hr or more .  Adjust CPAP pressure 12cmH2O CPAP download in 4 weeks  Follow up in 1 year with Dr. Wynona Neat or Calden Dorsey NP and As needed

## 2023-03-04 NOTE — Assessment & Plan Note (Signed)
Severe obstructive sleep apnea patient has excellent compliance.  He is encouraged on increasing his nightly sleep regimen to more than 6 hours each night.  He does have a lot of residual events.  Mask is leaking.  Will change to CPAP 12 cm H2O.  See if we can get better control.  If not may need to send for a mask fitting.  CPAP download in 1 month.  - discussed how weight can impact sleep and risk for sleep disordered breathing - discussed options to assist with weight loss: combination of diet modification, cardiovascular and strength training exercises   - had an extensive discussion regarding the adverse health consequences related to untreated sleep disordered breathing - specifically discussed the risks for hypertension, coronary artery disease, cardiac dysrhythmias, cerebrovascular disease, and diabetes - lifestyle modification discussed   - discussed how sleep disruption can increase risk of accidents, particularly when driving - safe driving practices were discussed

## 2023-03-04 NOTE — Telephone Encounter (Signed)
Check download in 4 weeks.

## 2023-03-04 NOTE — Telephone Encounter (Signed)
Called and spoke with [employer] Nida Boatman) regarding what is needed as patient is being charged $800 for CPAP machine.  Nida Boatman states that because of the change in insurance, he needs a new OV with notes stating that he is benefiting and using CPAP and also a new order.  Nothing further needed.

## 2023-03-06 ENCOUNTER — Ambulatory Visit (INDEPENDENT_AMBULATORY_CARE_PROVIDER_SITE_OTHER): Payer: Medicare Other | Admitting: *Deleted

## 2023-03-06 DIAGNOSIS — Z0000 Encounter for general adult medical examination without abnormal findings: Secondary | ICD-10-CM

## 2023-03-06 NOTE — Progress Notes (Signed)
 Subjective:   Bobby Jimenez is a 43 y.o. male who presents for Medicare Annual/Subsequent preventive examination.  Visit Complete: Virtual I connected with  Elsie GORMAN Keel on 03/06/23 by a audio enabled telemedicine application and verified that I am speaking with the correct person using two identifiers.  Patient Location: Home  Provider Location: Home Office  I discussed the limitations of evaluation and management by telemedicine. The patient expressed understanding and agreed to proceed.  Vital Signs: Because this visit was a virtual/telehealth visit, some criteria may be missing or patient reported. Any vitals not documented were not able to be obtained and vitals that have been documented are patient reported.  Patient Medicare AWV questionnaire was completed by the patient on 03-05-2023; I have confirmed that all information answered by patient is correct and no changes since this date.  Cardiac Risk Factors include: advanced age (>33men, >7 women);diabetes mellitus;male gender;obesity (BMI >30kg/m2)     Objective:    There were no vitals filed for this visit. There is no height or weight on file to calculate BMI.     03/06/2023   10:58 AM 10/15/2022   12:24 PM 02/26/2022   10:28 AM 02/22/2021    8:10 AM 01/25/2021   11:25 PM 09/04/2020    2:54 PM 01/27/2020   10:36 AM  Advanced Directives  Does Patient Have a Medical Advance Directive? No No No No No No No  Would patient like information on creating a medical advance directive?    No - Patient declined No - Patient declined  Yes (MAU/Ambulatory/Procedural Areas - Information given)    Current Medications (verified) Outpatient Encounter Medications as of 03/06/2023  Medication Sig   meloxicam (MOBIC) 15 MG tablet Take 15 mg by mouth daily.   amantadine  (SYMMETREL ) 100 MG capsule Take 1 capsule (100 mg total) by mouth 2 (two) times daily.   ARIPiprazole  (ABILIFY ) 15 MG tablet Take 7.5 mg by mouth 2 (two) times  daily.   clonazePAM  (KLONOPIN ) 0.5 MG tablet Take 0.25-0.5 mg by mouth 2 (two) times daily. Takes 1/2 tablet in am and 1 tablet at bedtime   fexofenadine  (ALLEGRA ) 180 MG tablet Take 1 tablet (180 mg total) by mouth daily.   fluticasone  (FLONASE ) 50 MCG/ACT nasal spray INSTILL 2 SPRAYS INTO EACH NOSTRIL DAILY.   gemfibrozil  (LOPID ) 600 MG tablet TAKE 1 TABLET TWICE DAILY BEFORE MEALS   icosapent  Ethyl (VASCEPA ) 1 g capsule Take 2 capsules (2 g total) by mouth 2 (two) times daily.   lisinopril  (ZESTRIL ) 10 MG tablet Take 1 tablet (10 mg total) by mouth daily.   metoprolol  tartrate (LOPRESSOR ) 25 MG tablet Take 1 tablet (25 mg total) by mouth 2 (two) times daily.   Multiple Vitamin (MULTIVITAMIN) tablet Take 1 tablet by mouth daily.   pantoprazole  (PROTONIX ) 20 MG tablet TAKE ONE TABLET BY MOUTH ONCE DAILY.   rosuvastatin  (CRESTOR ) 10 MG tablet Take 1 tablet (10 mg total) by mouth daily.   ZOLOFT  100 MG tablet Take 200 mg by mouth daily.   No facility-administered encounter medications on file as of 03/06/2023.    Allergies (verified) Pineapple, Latex, Morphine and codeine, and Statins   History: Past Medical History:  Diagnosis Date   ADHD (attention deficit hyperactivity disorder)    Anxiety    Bilateral carpal tunnel syndrome    transient relief from inj by ortho 04/2020; NCS confirmed bilat CTS->surgery planned as of 05/2020 ortho f/u   Bipolar disorder (HCC)    Cervical radiculopathy  Diabetes mellitus (HCC) 08/2020   Aug 2022->fasting gluc 130, Hba1c 7.6%   Dizziness    lightheaded   Food allergy    pineapple   GERD (gastroesophageal reflux disease)    Headache syndrome    Hepatic steatosis    u/s and CT confirmed 2020--no cirrhosis.   Hypertension    Hypertriglyceridemia    Very high (>800 while on vascepa  and rosuva 06/2019)->rosuva and vascepa  d/c'd and gemfibrozil  started, also referred pt to advanced lipid clinic.   IBS (irritable bowel syndrome)    D.  bentyl  helping  ([facility])   Latex allergy    Low back pain    Mixed hyperlipidemia    OSA on CPAP 10/16/2016   Tremors of nervous system    arms, dx'd as lasting side effect from lithium use in remote past: psych has him on amantadine  for this.   Vertigo    Past Surgical History:  Procedure Laterality Date   CARPAL TUNNEL RELEASE Left    L 05/2020.  Plan for R side to be done.   CARPAL TUNNEL RELEASE Right    07/2020   CHOLECYSTECTOMY N/A 12/26/2016   Procedure: LAPAROSCOPIC CHOLECYSTECTOMY;  Surgeon: Mavis Anes, MD;  Location: [facility];  Service: General;  Laterality: N/A;   KNEE SURGERY Right 1997   arthroscopic   polysomnogram  09/2016   Mild/mod OSA->CPAP (Dr. Electa)   WISDOM TOOTH EXTRACTION  1997   Family History  Adopted: Yes  Problem Relation Age of Onset   High blood pressure Mother    Seizures Mother    Social History   Socioeconomic History   Marital status: Divorced    Spouse name: Not on file   Number of children: 0   Years of education: 13   Highest education level: Not on file  Occupational History   Not on file  Tobacco Use   Smoking status: Former    Current packs/day: 1.00    Average packs/day: 1 pack/day for 10.0 years (10.0 ttl pk-yrs)    Types: Cigarettes   Smokeless tobacco: Former    Types: Snuff   Tobacco comments:    quit Nov 2018.  Started back in July of 2024, smoking ,1/2 ppd.  03/04/2023 hfb  Vaping Use   Vaping status: Never Used  Substance and Sexual Activity   Alcohol use: Yes    Alcohol/week: 1.0 standard drink of alcohol    Types: 1 Cans of beer per week    Comment: heavy in past, short period of time in 20s.; 05/24/20 occ   Drug use: No    Comment: Quit 1996   Sexual activity: Not Currently  Other Topics Concern   Not on file  Social History Narrative   Divorced, no children.   Educ: college at [employer]   Patient lives at home with his father and unemployed.  Disability for psych reasons.   Education some college.   Both  handed.   Caffeine three cups daily soda and coffee.   Alc: occ beer (remote hx of alc abuse, marijuana, acid--no IV drugs) in 2005-2006, surround his problems coping with GF's death.   Tobacco: former cig smoker, 20 pack-yr hx , quit 2018.   Social Drivers of Corporate Investment Banker Strain: Low Risk  (03/06/2023)   Overall Financial Resource Strain (CARDIA)    Difficulty of Paying Living Expenses: Not hard at all  Food Insecurity: No Food Insecurity (03/06/2023)   Hunger Vital Sign    Worried About Running  Out of Food in the Last Year: Never true    Ran Out of Food in the Last Year: Never true  Transportation Needs: No Transportation Needs (03/06/2023)   PRAPARE - Administrator, Civil Service (Medical): No    Lack of Transportation (Non-Medical): No  Physical Activity: Sufficiently Active (03/06/2023)   Exercise Vital Sign    Days of Exercise per Week: 5 days    Minutes of Exercise per Session: 120 min  Stress: No Stress Concern Present (03/06/2023)   [employer] of Occupational Health - Occupational Stress Questionnaire    Feeling of Stress : Not at all  Social Connections: Socially Isolated (03/06/2023)   Social Connection and Isolation Panel [NHANES]    Frequency of Communication with Friends and Family: Once a week    Frequency of Social Gatherings with Friends and Family: More than three times a week    Attends Religious Services: Never    Database Administrator or Organizations: No    Attends Banker Meetings: Never    Marital Status: Divorced    Tobacco Counseling Counseling given: Not Answered Tobacco comments: quit Nov 2018.  Started back in July of 2024, smoking ,1/2 ppd.  03/04/2023 hfb   Clinical Intake:                        Activities of Daily Living    03/06/2023   11:00 AM 03/05/2023    9:09 PM  In your present state of health, do you have any difficulty performing the following activities:  Hearing? 1 1  Vision? 0 0   Difficulty concentrating or making decisions? 0 0  Walking or climbing stairs? 0 0  Dressing or bathing? 0 0  Doing errands, shopping? 0 0  Preparing Food and eating ? N N  Using the Toilet? N N  In the past six months, have you accidently leaked urine? N N  Do you have problems with loss of bowel control? N N  Managing your Medications? N N  Managing your Finances? Y Y  Housekeeping or managing your Housekeeping? N N    Patient Care Team: Candise Aleene DEL, MD as PCP - General (Family Medicine) Shaaron Lamar HERO, MD as Consulting Physician (Gastroenterology) Milton Lade, MD as Consulting Physician (Neurology) Mona Vinie BROCKS, MD as Consulting Physician (Cardiology) Sebastian Lenis, MD as Consulting Physician (Orthopedic Surgery)  Indicate any recent Medical Services you may have received from other than [facility] providers in the past year (date may be approximate).     Assessment:   This is a routine wellness examination for Jager.  Hearing/Vision screen Hearing Screening - Comments:: Bilateral hearing aids Vision Screening - Comments:: Up to date Unsure of name   Goals Addressed             This Visit's Progress    Patient Stated   On track    Lose weight by eating healthier     Patient Stated       Keep losing weight stay healthy       Depression Screen    03/06/2023   11:02 AM 08/30/2022    8:35 AM 02/26/2022    9:17 AM 07/17/2021   10:45 AM 02/22/2021    8:08 AM 12/01/2020    9:08 AM 02/17/2020    4:46 PM  PHQ 2/9 Scores  PHQ - 2 Score 0 0 0 0 0 0 0  PHQ- 9 Score 2 0  Fall Risk    03/06/2023   10:56 AM 03/05/2023    9:09 PM 03/04/2023    8:33 AM 08/30/2022    8:35 AM 02/26/2022   10:29 AM  Fall Risk   Falls in the past year? 0 0 0 0 0  Number falls in past yr: 0   0 0  Injury with Fall? 0 0  0 0  Risk for fall due to :    No Fall Risks No Fall Risks  Follow up Falls evaluation completed;Education provided;Falls prevention discussed   Falls  evaluation completed Falls evaluation completed    MEDICARE RISK AT HOME: Medicare Risk at Home Any stairs in or around the home?: Yes If so, are there any without handrails?: No Home free of loose throw rugs in walkways, pet beds, electrical cords, etc?: Yes Adequate lighting in your home to reduce risk of falls?: Yes Life alert?: No Use of a cane, walker or w/c?: No Grab bars in the bathroom?: No Shower chair or bench in shower?: No Elevated toilet seat or a handicapped toilet?: No  TIMED UP AND GO:  Was the test performed?  No    Cognitive Function:        03/06/2023   10:59 AM 02/26/2022   10:16 AM 02/22/2021    8:12 AM  6CIT Screen  What Year? 0 points 0 points 0 points  What month? 0 points 0 points 0 points  What time? 0 points 0 points 0 points  Count back from 20 0 points 0 points 0 points  Months in reverse 0 points 0 points 4 points  Repeat phrase 0 points 0 points 2 points  Total Score 0 points 0 points 6 points    Immunizations Immunization History  Administered Date(s) Administered   Influenza, Seasonal, Injecte, Preservative Fre 01/14/2023   Influenza,inj,Quad PF,6+ Mos 12/01/2020   Moderna Covid-19 Vaccine Bivalent Booster 13yrs & up 12/14/2020   Moderna SARS-COV2 Booster Vaccination 09/02/2020   Moderna Sars-Covid-2 Vaccination 05/28/2019, 06/25/2019   PNEUMOCOCCAL CONJUGATE-20 12/01/2020   Pneumococcal Polysaccharide-23 11/29/2016   Tdap 01/29/2013, 09/04/2020    TDAP status: Up to date  Flu Vaccine status: Up to date  Pneumococcal vaccine status: Up to date  Covid-19 vaccine status: Information provided on how to obtain vaccines.   Qualifies for Shingles Vaccine? No   Zostavax completed No     Screening Tests Health Maintenance  Topic Date Due   Diabetic kidney evaluation - Urine ACR  02/27/2023   COVID-19 Vaccine (4 - 2024-25 season) 03/20/2023 (Originally 09/30/2022)   Diabetic kidney evaluation - eGFR measurement  08/30/2023    Medicare Annual Wellness (AWV)  03/05/2024   DTaP/Tdap/Td (3 - Td or Tdap) 09/05/2030   Pneumococcal Vaccine 68-45 Years old  Completed   INFLUENZA VACCINE  Completed   Hepatitis C Screening  Completed   HIV Screening  Completed   HPV VACCINES  Aged Out    Health Maintenance  Health Maintenance Due  Topic Date Due   Diabetic kidney evaluation - Urine ACR  02/27/2023     Lung Cancer Screening: (Low Dose CT Chest recommended if Age 52-80 years, 20 pack-year currently smoking OR have quit w/in 15years.) does not qualify.   Lung Cancer Screening Referral:   Additional Screening:  Hepatitis C Screening: does not qualify; Completed 2022  Vision Screening: Recommended annual ophthalmology exams for early detection of glaucoma and other disorders of the eye. Is the patient up to date with their annual eye exam?  Yes  Who is the provider or what is the name of the office in which the patient attends annual eye exams? Unsure of name If pt is not established with a provider, would they like to be referred to a provider to establish care? No .   Dental Screening: Recommended annual dental exams for proper oral hygiene  Nutrition Risk Assessment:  Has the patient had any N/V/D within the last 2 months?  No  Does the patient have any non-healing wounds?  No  Has the patient had any unintentional weight loss or weight gain?  No   Diabetes:  Is the patient diabetic?  Yes  If diabetic, was a CBG obtained today?  No  Did the patient bring in their glucometer from home?  No  How often do you monitor your CBG's? Controlled with diet does not check.   Financial Strains and Diabetes Management:  Are you having any financial strains with the device, your supplies or your medication? No .  Does the patient want to be seen by Chronic Care Management for management of their diabetes?  No  Would the patient like to be referred to a Nutritionist or for Diabetic Management?  No   Diabetic  Exams:  Diabetic Eye Exam: Completed .Pt has been advised about the importance in completing this exam.  Diabetic Foot Exam: . Pt has been advised about the importance in completing this exam..    Community Resource Referral / Chronic Care Management: CRR required this visit?  No   CCM required this visit?  No     Plan:     I have personally reviewed and noted the following in the patient's chart:   Medical and social history Use of alcohol, tobacco or illicit drugs  Current medications and supplements including opioid prescriptions. Patient is not currently taking opioid prescriptions. Functional ability and status Nutritional status Physical activity Advanced directives List of other physicians Hospitalizations, surgeries, and ER visits in previous 12 months Vitals Screenings to include cognitive, depression, and falls Referrals and appointments  In addition, I have reviewed and discussed with patient certain preventive protocols, quality metrics, and best practice recommendations. A written personalized care plan for preventive services as well as general preventive health recommendations were provided to patient.     Mliss Graff, LPN   08/01/7972   After Visit Summary: (MyChart) Due to this being a telephonic visit, the after visit summary with patients personalized plan was offered to patient via MyChart   Nurse Notes:

## 2023-03-06 NOTE — Patient Instructions (Signed)
 Bobby Jimenez , Thank you for taking time to come for your Medicare Wellness Visit. I appreciate your ongoing commitment to your health goals. Please review the following plan we discussed and let me know if I can assist you in the future.   Screening recommendations/referrals:  Recommended yearly ophthalmology/optometry visit for glaucoma screening and checkup Recommended yearly dental visit for hygiene and checkup  Vaccinations: Influenza vaccine: up to date  Tdap vaccine: up to date     Advanced directives: Education provided     Preventive Care 40-64 Years, Male Preventive care refers to lifestyle choices and visits with your health care provider that can promote health and wellness. What does preventive care include? A yearly physical exam. This is also called an annual well check. Dental exams once or twice a year. Routine eye exams. Ask your health care provider how often you should have your eyes checked. Personal lifestyle choices, including: Daily care of your teeth and gums. Regular physical activity. Eating a healthy diet. Avoiding tobacco and drug use. Limiting alcohol use. Practicing safe sex. Taking low-dose aspirin every day starting at age 32. What happens during an annual well check? The services and screenings done by your health care provider during your annual well check will depend on your age, overall health, lifestyle risk factors, and family history of disease. Counseling  Your health care provider may ask you questions about your: Alcohol use. Tobacco use. Drug use. Emotional well-being. Home and relationship well-being. Sexual activity. Eating habits. Work and work astronomer. Screening  You may have the following tests or measurements: Height, weight, and BMI. Blood pressure. Lipid and cholesterol levels. These may be checked every 5 years, or more frequently if you are over 65 years old. Skin check. Lung cancer screening. You may have  this screening every year starting at age 29 if you have a 30-pack-year history of smoking and currently smoke or have quit within the past 15 years. Fecal occult blood test (FOBT) of the stool. You may have this test every year starting at age 43. Flexible sigmoidoscopy or colonoscopy. You may have a sigmoidoscopy every 5 years or a colonoscopy every 10 years starting at age 86. Prostate cancer screening. Recommendations will vary depending on your family history and other risks. Hepatitis C blood test. Hepatitis B blood test. Sexually transmitted disease (STD) testing. Diabetes screening. This is done by checking your blood sugar (glucose) after you have not eaten for a while (fasting). You may have this done every 1-3 years. Discuss your test results, treatment options, and if necessary, the need for more tests with your health care provider. Vaccines  Your health care provider may recommend certain vaccines, such as: Influenza vaccine. This is recommended every year. Tetanus, diphtheria, and acellular pertussis (Tdap, Td) vaccine. You may need a Td booster every 10 years. Zoster vaccine. You may need this after age 41. Pneumococcal 13-valent conjugate (PCV13) vaccine. You may need this if you have certain conditions and have not been vaccinated. Pneumococcal polysaccharide (PPSV23) vaccine. You may need one or two doses if you smoke cigarettes or if you have certain conditions. Talk to your health care provider about which screenings and vaccines you need and how often you need them. This information is not intended to replace advice given to you by your health care provider. Make sure you discuss any questions you have with your health care provider. Document Released: 02/11/2015 Document Revised: 10/05/2015 Document Reviewed: 11/16/2014 Elsevier Interactive Patient Education  2017 [employer].  Fall Prevention in the Home Falls can cause injuries. They can happen to people of all ages.  There are many things you can do to make your home safe and to help prevent falls. What can I do on the outside of my home? Regularly fix the edges of walkways and driveways and fix any cracks. Remove anything that might make you trip as you walk through a door, such as a raised step or threshold. Trim any bushes or trees on the path to your home. Use bright outdoor lighting. Clear any walking paths of anything that might make someone trip, such as rocks or tools. Regularly check to see if handrails are loose or broken. Make sure that both sides of any steps have handrails. Any raised decks and porches should have guardrails on the edges. Have any leaves, snow, or ice cleared regularly. Use sand or salt on walking paths during winter. Clean up any spills in your garage right away. This includes oil or grease spills. What can I do in the bathroom? Use night lights. Install grab bars by the toilet and in the tub and shower. Do not use towel bars as grab bars. Use non-skid mats or decals in the tub or shower. If you need to sit down in the shower, use a plastic, non-slip stool. Keep the floor dry. Clean up any water that spills on the floor as soon as it happens. Remove soap buildup in the tub or shower regularly. Attach bath mats securely with double-sided non-slip rug tape. Do not have throw rugs and other things on the floor that can make you trip. What can I do in the bedroom? Use night lights. Make sure that you have a light by your bed that is easy to reach. Do not use any sheets or blankets that are too big for your bed. They should not hang down onto the floor. Have a firm chair that has side arms. You can use this for support while you get dressed. Do not have throw rugs and other things on the floor that can make you trip. What can I do in the kitchen? Clean up any spills right away. Avoid walking on wet floors. Keep items that you use a lot in easy-to-reach places. If you need  to reach something above you, use a strong step stool that has a grab bar. Keep electrical cords out of the way. Do not use floor polish or wax that makes floors slippery. If you must use wax, use non-skid floor wax. Do not have throw rugs and other things on the floor that can make you trip. What can I do with my stairs? Do not leave any items on the stairs. Make sure that there are handrails on both sides of the stairs and use them. Fix handrails that are broken or loose. Make sure that handrails are as long as the stairways. Check any carpeting to make sure that it is firmly attached to the stairs. Fix any carpet that is loose or worn. Avoid having throw rugs at the top or bottom of the stairs. If you do have throw rugs, attach them to the floor with carpet tape. Make sure that you have a light switch at the top of the stairs and the bottom of the stairs. If you do not have them, ask someone to add them for you. What else can I do to help prevent falls? Wear shoes that: Do not have high heels. Have rubber bottoms. Are comfortable and fit  you well. Are closed at the toe. Do not wear sandals. If you use a stepladder: Make sure that it is fully opened. Do not climb a closed stepladder. Make sure that both sides of the stepladder are locked into place. Ask someone to hold it for you, if possible. Clearly mark and make sure that you can see: Any grab bars or handrails. First and last steps. Where the edge of each step is. Use tools that help you move around (mobility aids) if they are needed. These include: Canes. Walkers. Scooters. Crutches. Turn on the lights when you go into a dark area. Replace any light bulbs as soon as they burn out. Set up your furniture so you have a clear path. Avoid moving your furniture around. If any of your floors are uneven, fix them. If there are any pets around you, be aware of where they are. Review your medicines with your doctor. Some medicines can  make you feel dizzy. This can increase your chance of falling. Ask your doctor what other things that you can do to help prevent falls. This information is not intended to replace advice given to you by your health care provider. Make sure you discuss any questions you have with your health care provider. Document Released: 11/11/2008 Document Revised: 06/23/2015 Document Reviewed: 02/19/2014 Elsevier Interactive Patient Education  2017 [employer].

## 2023-04-02 NOTE — Telephone Encounter (Signed)
 DL placed in Bobby Jimenez's review folder.  Please advise.  Thank you.

## 2023-04-11 NOTE — Telephone Encounter (Signed)
 CPAP download from February 6 through March 3 shows ongoing high residual sleep apneic events.  Compliance is at 85% daily average usage at 5.5 hours.  Patient is on CPAP 12 cm H2O.  AHI 23.8/hour.  Recommend setting up for a CPAP titration study.  Will need follow-up visit in 4 weeks after titration study to review results and decide on next step Order in .

## 2023-04-12 ENCOUNTER — Telehealth: Payer: Self-pay | Admitting: *Deleted

## 2023-04-12 NOTE — Telephone Encounter (Signed)
 I spoke with patient and made him aware of need for CPAP titration study.  He lives in [place] and this study will need to be done at [facility].  I saw a date of 06/14/23, but it looked like the address for [facility] sleep center.  Please verify that this is being set up at [facility].  Thank you.

## 2023-04-12 NOTE — Telephone Encounter (Signed)
 Called and spoke with patient, advised him of the response/recommendations per Rubye Oaks NP.  He verbalized understanding and that the CPAP Titration study needs to be done at [facility] as he lives in [place] and he needs the OV to be in [place] as well.  I advised him I would send a message to the [facility] to remind her that he lives in [place] and that [facility] does have openings in [place] in may and June.  Nothing further needed.

## 2023-04-17 NOTE — Telephone Encounter (Signed)
 I was unaware that sleep studies were no longer done at [facility].  Please make patient aware when scheduling.

## 2023-04-23 ENCOUNTER — Encounter: Payer: Self-pay | Admitting: Adult Health

## 2023-04-24 LAB — HM DIABETES EYE EXAM

## 2023-04-24 NOTE — Telephone Encounter (Signed)
 Called and spoke with patient, I verifed that he was contaced by the [facility] to let him know that his CPAP titration study would be at [facility] sleep center since they are no longer doing sleep studies at [facility] hospital.  He said he was told he would receive a letter in the mail with all the details.  His study is scheduled for 5/16.  He wanted to know if he needed to bring his medications that he takes.  I advised him to call the [facility] sleep center and as them that question as I do not know how they do medications at the sleep center.  He verbalized understanding and said he would call them when he receives the letter in the mail.  Nothing further needed.

## 2023-04-25 ENCOUNTER — Encounter: Payer: Self-pay | Admitting: Internal Medicine

## 2023-04-25 ENCOUNTER — Ambulatory Visit: Payer: Medicare Other | Attending: Internal Medicine | Admitting: Internal Medicine

## 2023-04-25 VITALS — BP 110/60 | HR 97 | Ht 69.0 in | Wt 248.8 lb

## 2023-04-25 DIAGNOSIS — E781 Pure hyperglyceridemia: Secondary | ICD-10-CM | POA: Insufficient documentation

## 2023-04-25 LAB — LIPID PANEL
Chol/HDL Ratio: 4.3 ratio (ref 0.0–5.0)
Cholesterol, Total: 153 mg/dL (ref 100–199)
HDL: 36 mg/dL — ABNORMAL LOW (ref >39)
LDL Chol Calc (NIH): 66 mg/dL (ref 0–99)
Triglycerides: 324 mg/dL — ABNORMAL HIGH (ref 0–149)
VLDL Cholesterol Cal: 51 mg/dL — ABNORMAL HIGH (ref 5–40)

## 2023-04-25 MED ORDER — ICOSAPENT ETHYL 1 G PO CAPS: 2.0000 g | ORAL_CAPSULE | Freq: Two times a day (BID) | ORAL | 2 refills | Status: AC

## 2023-04-25 NOTE — Progress Notes (Signed)
 LIPID CLINIC CONSULT NOTE  Chief Complaint:  Follow-up high triglycerides  Primary Care Physician: Jeoffrey Massed, MD  Primary Cardiologist:  None  HPI:  Bobby Jimenez is a 43 y.o. male who is being seen today for the evaluation of high triglycerides at the request of [name], [name], MD.  This is a pleasant 43 year old male is kindly referred for evaluation management of hypertriglyceridemia.  Mr. Bobby Jimenez has a longstanding history of elevated triglycerides.  He says his uncle by birth had high triglycerides and is very thin.  He did not know much about his father since he is adopted but his birth mother may have also had high triglycerides.  She is now deceased.  Over a year ago triglycerides were noted at 745, then improved somewhat to 581 after work with a dietitian and more recently 3 months ago were elevated 837.  Direct LDL was 53 and hemoglobin Z6X was 5.6.  He has no coronary disease that is notable.  He denies any history of pancreatitis.  He is on medications that could alter his triglyceride metabolism, including Abilify which is known to raise triglycerides, as well as this sertraline and to a small extent metoprolol.  He works with a Therapist, sports, Dr. Jannifer Franklin.  He reports very rare alcohol use, no more than 1-2 drinks per week.  02/10/2020  [name] is seen today via telephone follow-up.  Overall he seems to be doing well.  He says he is improved his diet and is more physically active.  He is now working a job that requires more physical activity.  Mind has had continued improvement in his lipids.  Total cholesterol now 168, triglycerides 348 (down from over 800, approximately 6 months ago), HDL 29 and LDL of 82.  APO B level has come down from 122-107.  He is not currently on statin therapy.  11/30/2021  Mr. Bobby Jimenez is seen today for follow-up.  He has had an interval increase in his lipids.  Triglycerides have gone up from 289-326.  Total cholesterol 195, his  direct LDL now is 105.  As this is increased slightly, I do think he would benefit from addition of a low-dose statin.  He says he is tried statins in the distant past and had issues with urination but seem to improve after stopping it.  He is however willing to trial a low-dose statin.  04/19/2022  Mr. Bobby Jimenez is seen today in follow-up.  His last lipids in November showed a small increase in triglycerides.  Total cholesterol however is come down to 186, triglycerides 472, HDL 31 and LDL is 79.  He seems to be tolerating low-dose rosuvastatin without any additional issues.  10/04/2022  Mr. Bobby Jimenez returns today for follow-up of high triglycerides.  He did have recent repeat labs which showed a direct LDL of 76, total cholesterol 156, triglycerides 360 and HDL 38.  This is actually the lowest his triglycerides have been in some time.  04/25/2023  Mr. Bobby Jimenez returns today for follow-up.  Fortunately did not have lipids prior to this visit.  He reports compliance with his medicines.  He is recently lost some weight.  He does need a refill of his Vascepa.  He says this is cost effective for him.  He denies any chest pain or shortness of breath.  He has not had any episodes of pancreatitis.  PMHx:  Past Medical History:  Diagnosis Date   ADHD (attention deficit hyperactivity disorder)    Anxiety  Bilateral carpal tunnel syndrome    transient relief from inj by ortho 04/2020; NCS confirmed bilat CTS->surgery planned as of 05/2020 ortho f/u   Bipolar disorder [facility])    Cervical radiculopathy    Diabetes mellitus (HCC) 08/2020   Aug 2022->fasting gluc 130, Hba1c 7.6%   Dizziness    lightheaded   Food allergy    pineapple   GERD (gastroesophageal reflux disease)    Headache syndrome    Hepatic steatosis    u/s and CT confirmed 2020--no cirrhosis.   Hypertension    Hypertriglyceridemia    Very high (>800 while on vascepa and rosuva 06/2019)->rosuva and vascepa d/c'd and gemfibrozil started,  also referred pt to advanced lipid clinic.   IBS (irritable bowel syndrome)    D.  bentyl helping (Bobby Jimenez)   Latex allergy    Low back pain    Mixed hyperlipidemia    OSA on CPAP 10/16/2016   Tremors of nervous system    arms, dx'd as lasting side effect from lithium use in remote past: psych has him on amantadine for this.   Vertigo     Past Surgical History:  Procedure Laterality Date   CARPAL TUNNEL RELEASE Left    L 05/2020.  Plan for R side to be done.   CARPAL TUNNEL RELEASE Right    07/2020   CHOLECYSTECTOMY N/A 12/26/2016   Procedure: LAPAROSCOPIC CHOLECYSTECTOMY;  Surgeon: Franky Macho, MD;  Location: [facility];  Service: General;  Laterality: N/A;   KNEE SURGERY Right 1997   arthroscopic   polysomnogram  09/2016   Mild/mod OSA->CPAP (Dr. Lestine Mount)   WISDOM TOOTH EXTRACTION  1997    FAMHx:  Family History  Adopted: Yes  Problem Relation Age of Onset   High blood pressure Mother    Seizures Mother     SOCHx:   reports that he has been smoking cigarettes. He has a 10 pack-year smoking history. He has quit using smokeless tobacco.  His smokeless tobacco use included snuff. He reports current alcohol use of about 1.0 standard drink of alcohol per week. He reports that he does not use drugs.  ALLERGIES:  Allergies  Allergen Reactions   Pineapple Shortness Of Breath and Swelling   Latex Swelling   Morphine And Codeine Hives and Itching   Statins     "locks urinary tract up"    ROS: Pertinent items noted in HPI and remainder of comprehensive ROS otherwise negative.  HOME MEDS: Current Outpatient Medications on File Prior to Visit  Medication Sig Dispense Refill   amantadine (SYMMETREL) 100 MG capsule Take 1 capsule (100 mg total) by mouth 2 (two) times daily. 60 capsule 0   ARIPiprazole (ABILIFY) 15 MG tablet Take 7.5 mg by mouth 2 (two) times daily.     clonazePAM (KLONOPIN) 0.5 MG tablet Take 0.25-0.5 mg by mouth 2 (two) times daily. Takes 1/2  tablet in am and 1 tablet at bedtime     fluticasone (FLONASE) 50 MCG/ACT nasal spray INSTILL 2 SPRAYS INTO EACH NOSTRIL DAILY. 16 g 3   gemfibrozil (LOPID) 600 MG tablet TAKE 1 TABLET TWICE DAILY BEFORE MEALS 180 tablet 2   icosapent Ethyl (VASCEPA) 1 g capsule Take 2 capsules (2 g total) by mouth 2 (two) times daily. 360 capsule 2   lisinopril (ZESTRIL) 10 MG tablet Take 1 tablet (10 mg total) by mouth daily. 90 tablet 1   meloxicam (MOBIC) 15 MG tablet Take 15 mg by mouth daily.     metoprolol  tartrate (LOPRESSOR) 25 MG tablet Take 1 tablet (25 mg total) by mouth 2 (two) times daily. 60 tablet 0   Multiple Vitamin (MULTIVITAMIN) tablet Take 1 tablet by mouth daily.     pantoprazole (PROTONIX) 20 MG tablet TAKE ONE TABLET BY MOUTH ONCE DAILY. 30 tablet 5   rosuvastatin (CRESTOR) 10 MG tablet Take 1 tablet (10 mg total) by mouth daily. 90 tablet 3   traZODone (DESYREL) 50 MG tablet Take 50 mg by mouth at bedtime as needed.     ZOLOFT 100 MG tablet Take 200 mg by mouth daily.     No current facility-administered medications on file prior to visit.    LABS/IMAGING: No results found for this or any previous visit (from the past 48 hours). No results found.  LIPID PANEL:    Component Value Date/Time   CHOL 156 08/30/2022 0851   CHOL 186 12/01/2021 1143   TRIG 360.0 (H) 08/30/2022 0851   HDL 38.10 (L) 08/30/2022 0851   HDL 31 (L) 12/01/2021 1143   CHOLHDL 4 08/30/2022 0851   VLDL 72.0 (H) 08/30/2022 0851   LDLCALC 79 12/01/2021 1143   LDLDIRECT 76.0 08/30/2022 0851    WEIGHTS: Wt Readings from Last 3 Encounters:  04/25/23 248 lb 12.8 oz (112.9 kg)  01/14/23 254 lb 9.6 oz (115.5 kg)  10/15/22 264 lb (119.7 kg)    VITALS: BP 110/60 (BP Location: Left Arm, Patient Position: Sitting, Cuff Size: Normal)   Pulse 97   Ht 5\' 9"  (1.753 m)   Wt 248 lb 12.8 oz (112.9 kg)   SpO2 99%   BMI 36.74 kg/m   EXAM: Deferred  EKG: Deferred  ASSESSMENT: Primary  hypertriglyceridemia  PLAN: 1.   Mr. Tugwell did not have lab work prior to this visit.  Will obtain labs today as he is fasting.  I will refill his Vascepa.  Will continue his current medications.  I may make adjustments as necessary.  Plan follow-up with Marcelino Duster in a year or sooner as necessary.  Chrystie Nose, MD, [facility], FACP  [place]  [facility]  Medical Director of the Advanced Lipid Disorders &  Cardiovascular Risk Reduction Clinic Diplomate of the American Board of Clinical Lipidology Attending Cardiologist  Direct Dial: [phone]  Fax: [phone]  Website:  [email or web address] 04/25/2023, 10:02 AM

## 2023-04-25 NOTE — Patient Instructions (Signed)
 Medication Instructions:  Your physician recommends that you continue on your current medications as directed. Please refer to the Current Medication list given to you today.    *If you need a refill on your cardiac medications before your next appointment, please call your pharmacy*   Lab Work: Lipid Panel    If you have labs (blood work) drawn today and your tests are completely normal, you will receive your results only by: MyChart Message (if you have MyChart) OR A paper copy in the mail If you have any lab test that is abnormal or we need to change your treatment, we will call you to review the results.   Testing/Procedures: None    Follow-Up: At [facility], you and your health needs are our priority.  As part of our continuing mission to provide you with exceptional heart care, we have created designated Provider Care Teams.  These Care Teams include your primary Cardiologist (physician) and Advanced Practice Providers (APPs -  Physician Assistants and Nurse Practitioners) who all work together to provide you with the care you need, when you need it.  We recommend signing up for the patient portal called "MyChart".  Sign up information is provided on this After Visit Summary.  MyChart is used to connect with patients for Virtual Visits (Telemedicine).  Patients are able to view lab/test results, encounter notes, upcoming appointments, etc.  Non-urgent messages can be sent to your provider as well.   To learn more about what you can do with MyChart, go to [email or web address].    Your next appointment:   1 year(s)  The format for your next appointment:   In Person  Provider:   Eligha Bridegroom, NP   Other Instructions

## 2023-04-26 ENCOUNTER — Other Ambulatory Visit: Payer: Self-pay | Admitting: Gastroenterology

## 2023-04-26 ENCOUNTER — Other Ambulatory Visit: Payer: Self-pay | Admitting: Family Medicine

## 2023-04-29 ENCOUNTER — Other Ambulatory Visit: Payer: Self-pay | Admitting: Family Medicine

## 2023-04-29 ENCOUNTER — Telehealth: Payer: Self-pay | Admitting: Pharmacy Technician

## 2023-04-29 ENCOUNTER — Other Ambulatory Visit (HOSPITAL_COMMUNITY): Payer: Self-pay

## 2023-04-29 ENCOUNTER — Encounter: Payer: Self-pay | Admitting: *Deleted

## 2023-04-29 DIAGNOSIS — I10 Essential (primary) hypertension: Secondary | ICD-10-CM

## 2023-04-29 NOTE — Telephone Encounter (Signed)
 Per test claim for vascepa: Refill too soon. PA is not needed at this time. Medication was filled 04/25/23. Next eligible fill date is 05/17/23.    I called the pharmacy and they ran the generic this time due to the brand being on backorder. It was filled on a transition fill, ins prefers brand. The pharmacist made a note to keep trying to get brand and run for brand if possible next fill

## 2023-04-30 ENCOUNTER — Encounter: Payer: Self-pay | Admitting: Internal Medicine

## 2023-05-02 ENCOUNTER — Other Ambulatory Visit: Payer: Self-pay | Admitting: Family Medicine

## 2023-05-02 MED ORDER — METOPROLOL TARTRATE 25 MG PO TABS: 25.0000 mg | ORAL_TABLET | Freq: Two times a day (BID) | ORAL | 0 refills | Status: DC

## 2023-05-02 NOTE — Telephone Encounter (Signed)
 Copied from CRM [id number]. Topic: Clinical - Medication Question >> May 02, 2023  9:08 AM Bobby Jimenez wrote: Reason for CRM: Patient is calling because he has not heard anything about his metoprolol tartrate (LOPRESSOR) 25 MG tablet. He states his pharmacy already put in his refill but no information has been heard about the approval yet.  Patient would like a call back on when he can get his refill

## 2023-05-10 ENCOUNTER — Encounter: Payer: Self-pay | Admitting: Urgent Care

## 2023-05-10 ENCOUNTER — Ambulatory Visit (INDEPENDENT_AMBULATORY_CARE_PROVIDER_SITE_OTHER): Admitting: Urgent Care

## 2023-05-10 VITALS — BP 128/79 | HR 84 | Temp 98.2°F | Wt 251.0 lb

## 2023-05-10 DIAGNOSIS — H60313 Diffuse otitis externa, bilateral: Secondary | ICD-10-CM

## 2023-05-10 MED ORDER — CIPROFLOXACIN-DEXAMETHASONE 0.3-0.1 % OT SUSP
4.0000 [drp] | Freq: Two times a day (BID) | OTIC | 0 refills | Status: AC
Start: 2023-05-10 — End: 2023-05-17

## 2023-05-10 NOTE — Progress Notes (Signed)
 Established Patient Office Visit  Subjective:  Patient ID: Bobby Jimenez, male    DOB: 15-Nov-1980  Age: 43 y.o. MRN: [id number]  Chief Complaint  Patient presents with   Ear Pain    Pt has been having ear pain for about a week now in both ears. He denies any other symptoms.     HPI  Discussed the use of AI scribe software for clinical note transcription with the patient, who gave verbal consent to proceed.  History of Present Illness   Bobby Jimenez is a 43 year old male with a history of ear infections who presents with bilateral ear discomfort and moisture.  He has been experiencing bilateral ear discomfort and moisture for approximately one week. The sensation is described as 'very moist inside my ears' with associated itching and pain, particularly on the outside of the ears. It 'hurts to touch' the top part of the ear. No fever or change in hearing beyond his baseline. He has not attempted any treatments for the current symptoms as he does not have any medications available.  He has a history of ear infections since childhood and currently has a tube in his left ear, which has been in place for almost ten years. The tube in the right ear fell out previously. Past treatments for ear infections included amoxicillin and ear drops, specifically cortisporin otic.  No upper respiratory symptoms such as nasal congestion, sinus pressure, postnasal drip, or sneezing beyond his baseline allergies. He mentions that he has eczema but does not recall it affecting his ears previously.  He lives with his father, whom he takes care of, and works as a Financial risk analyst. He maintains cleanliness of his hearing aids by wiping them down and changing the tips and lights. He has hx of diabetes, which is well-controlled with a last A1c of 5.7%. He was taken off medication due to good control of his blood sugar levels.       Patient Active Problem List   Diagnosis Date Noted   OSA (obstructive sleep  apnea) 03/04/2023   Rectal bleeding 08/28/2021   Increased bowel frequency 08/28/2021   Irritable bowel syndrome with diarrhea 05/05/2019   Obesity (BMI 30-39.9) 01/07/2019   Abdominal pain 12/30/2018   Fatty liver disease, nonalcoholic 12/30/2018   Sinus tachycardia 12/27/2016   Atelectasis 12/27/2016   Empyema of gallbladder    Sepsis due to undetermined organism (HCC) 12/23/2016   Hyponatremia 12/23/2016   Hypokalemia 12/23/2016   Calculus of gallbladder with acute cholecystitis without obstruction    Gastroesophageal reflux disease 09/15/2014   Nausea with vomiting 09/15/2014   Diarrhea 09/15/2014   Left sided abdominal pain 09/15/2014   Alteration consciousness 12/23/2013   Hypertension    Anxiety    Dizziness    Lightheaded    Past Medical History:  Diagnosis Date   ADHD (attention deficit hyperactivity disorder)    Anxiety    Bilateral carpal tunnel syndrome    transient relief from inj by ortho 04/2020; NCS confirmed bilat CTS->surgery planned as of 05/2020 ortho f/u   Bipolar disorder (HCC)    Cervical radiculopathy    Diabetes mellitus (HCC) 08/2020   Aug 2022->fasting gluc 130, Hba1c 7.6%   Dizziness    lightheaded   Food allergy    pineapple   GERD (gastroesophageal reflux disease)    Headache syndrome    Hepatic steatosis    u/s and CT confirmed 2020--no cirrhosis.   Hypertension    Hypertriglyceridemia  Very high (>800 while on vascepa and rosuva 06/2019)->rosuva and vascepa d/c'd and gemfibrozil started, also referred pt to advanced lipid clinic.   IBS (irritable bowel syndrome)    D.  bentyl helping ([facility])   Latex allergy    Low back pain    Mixed hyperlipidemia    OSA on CPAP 10/16/2016   Tremors of nervous system    arms, dx'd as lasting side effect from lithium use in remote past: psych has him on amantadine for this.   Vertigo    Past Surgical History:  Procedure Laterality Date   CARPAL TUNNEL RELEASE Left    L 05/2020.  Plan  for R side to be done.   CARPAL TUNNEL RELEASE Right    07/2020   CHOLECYSTECTOMY N/A 12/26/2016   Procedure: LAPAROSCOPIC CHOLECYSTECTOMY;  Surgeon: Franky Macho, MD;  Location: [facility];  Service: General;  Laterality: N/A;   KNEE SURGERY Right 1997   arthroscopic   polysomnogram  09/2016   Mild/mod OSA->CPAP (Dr. Lestine Mount)   WISDOM TOOTH EXTRACTION  1997   Social History   Tobacco Use   Smoking status: Every Day    Current packs/day: 1.00    Average packs/day: 1 pack/day for 10.0 years (10.0 ttl pk-yrs)    Types: Cigarettes   Smokeless tobacco: Former    Types: Snuff   Tobacco comments:    quit Nov 2018.  Started back in July of 2024, smoking ,1/2 ppd.  03/04/2023 hfb  Vaping Use   Vaping status: Never Used  Substance Use Topics   Alcohol use: Yes    Alcohol/week: 1.0 standard drink of alcohol    Types: 1 Cans of beer per week    Comment: heavy in past, short period of time in 20s.; 05/24/20 occ   Drug use: No    Comment: Quit 1996      ROS: as noted in HPI  Objective:     BP 128/79   Pulse 84   Temp 98.2 F (36.8 C) (Oral)   Wt 251 lb (113.9 kg)   SpO2 97%   BMI 37.07 kg/m  BP Readings from Last 3 Encounters:  05/10/23 128/79  04/25/23 110/60  03/04/23 122/74   Wt Readings from Last 3 Encounters:  05/10/23 251 lb (113.9 kg)  04/25/23 248 lb 12.8 oz (112.9 kg)  01/14/23 254 lb 9.6 oz (115.5 kg)      Physical Exam Vitals and nursing note reviewed.  Constitutional:      General: He is not in acute distress.    Appearance: Normal appearance. He is obese. He is not ill-appearing, toxic-appearing or diaphoretic.  HENT:     Head: Normocephalic.     Right Ear: Drainage and swelling present. No tenderness. No mastoid tenderness. No PE tube. Tympanic membrane is scarred. Tympanic membrane is not injected or erythematous.     Left Ear: Drainage, swelling and tenderness present. No mastoid tenderness. A PE tube is present. Tympanic membrane is scarred. Tympanic  membrane is not injected or erythematous.     Ears:     Comments: Significant scaling, flaking and dryness to B external ear, R>L Edema and drainage of ear canal B TM tube in place on L, appears to be blocked currently due to ear canal debris    Nose: Nose normal.     Mouth/Throat:     Mouth: Mucous membranes are moist.  Eyes:     General: No scleral icterus.       Right eye:  No discharge.        Left eye: No discharge.     Extraocular Movements: Extraocular movements intact.     Pupils: Pupils are equal, round, and reactive to light.  Cardiovascular:     Rate and Rhythm: Normal rate.  Pulmonary:     Effort: Pulmonary effort is normal. No respiratory distress.  Lymphadenopathy:     Cervical: No cervical adenopathy.  Neurological:     Mental Status: He is alert and oriented to person, place, and time.      No results found for any visits on 05/10/23.  Last CBC Lab Results  Component Value Date   WBC 6.7 08/29/2021   HGB 13.3 08/29/2021   HCT 37.5 08/29/2021   MCV 90 08/29/2021   MCH 32.0 08/29/2021   RDW 14.0 08/29/2021   PLT 234 08/29/2021   Last metabolic panel Lab Results  Component Value Date   GLUCOSE 104 (H) 08/30/2022   NA 137 08/30/2022   K 4.1 08/30/2022   CL 102 08/30/2022   CO2 26 08/30/2022   BUN 16 08/30/2022   CREATININE 0.83 08/30/2022   GFR 107.11 08/30/2022   CALCIUM 9.8 08/30/2022   PROT 7.4 08/30/2022   ALBUMIN 4.8 08/30/2022   BILITOT 0.4 08/30/2022   ALKPHOS 67 08/30/2022   AST 19 08/30/2022   ALT 27 08/30/2022   ANIONGAP 9 01/25/2021      The 10-year ASCVD risk score (Arnett DK, et al., 2019) is: 10.6%  Assessment & Plan:  Acute diffuse otitis externa of both ears -     Ciprofloxacin-dexAMETHasone; Place 4 drops into both ears 2 (two) times daily for 7 days.  Dispense: 7.5 mL; Refill: 0 -     WOUND CULTURE  Assessment and Plan    Otitis Externa Bilateral ear discomfort, question bacterial vs fungal involvement. Tympanostomy  tube in left ear. Ciprodex chosen for safety and efficacy in treating infection and inflammation. - Prescribe Ciprodex drops, 4 drops BID for seven days. - Instruct on proper administration of ear drops. - Perform ear culture to identify organisms and sensitivities. - Schedule follow-up in one week. - Consider fluocinolone otic oil for maintenance if eczema persists.  Eczema Potential contributor to recurrent ear infections due to itching and scaling. - Consider fluocinolone otic oil for maintenance if eczema persists.  Diabetes Mellitus Well-controlled with A1c of 5.7%. May predispose to recurrent infections.         Return in about 1 week (around 05/17/2023).   Maretta Bees, PA

## 2023-05-10 NOTE — Patient Instructions (Signed)
 You have otitis externa, which is an infection of the ear canal. Please use 4 drops of the Ciprodex to each ear twice daily. Leave head tilted to the side for at least 10 minutes after each administration.  Please return for recheck in one week.

## 2023-05-14 ENCOUNTER — Encounter: Payer: Self-pay | Admitting: Urgent Care

## 2023-05-14 LAB — WOUND CULTURE
MICRO NUMBER:: 16321450
SPECIMEN QUALITY:: ADEQUATE

## 2023-05-20 ENCOUNTER — Ambulatory Visit (INDEPENDENT_AMBULATORY_CARE_PROVIDER_SITE_OTHER): Admitting: Urgent Care

## 2023-05-20 ENCOUNTER — Encounter: Payer: Self-pay | Admitting: Urgent Care

## 2023-05-20 VITALS — BP 119/76 | HR 77 | Wt 249.0 lb

## 2023-05-20 DIAGNOSIS — R7303 Prediabetes: Secondary | ICD-10-CM | POA: Diagnosis not present

## 2023-05-20 DIAGNOSIS — H60313 Diffuse otitis externa, bilateral: Secondary | ICD-10-CM | POA: Diagnosis not present

## 2023-05-20 DIAGNOSIS — L219 Seborrheic dermatitis, unspecified: Secondary | ICD-10-CM

## 2023-05-20 DIAGNOSIS — E781 Pure hyperglyceridemia: Secondary | ICD-10-CM

## 2023-05-20 DIAGNOSIS — H60541 Acute eczematoid otitis externa, right ear: Secondary | ICD-10-CM

## 2023-05-20 LAB — POCT GLYCOSYLATED HEMOGLOBIN (HGB A1C)
HbA1c POC (<> result, manual entry): 5.6 % (ref 4.0–5.6)
HbA1c, POC (controlled diabetic range): 5.6 % (ref 0.0–7.0)
HbA1c, POC (prediabetic range): 5.6 % — AB (ref 5.7–6.4)
Hemoglobin A1C: 5.6 % (ref 4.0–5.6)

## 2023-05-20 MED ORDER — FLUOCINOLONE ACETONIDE 0.01 % OT OIL: 5.0000 [drp] | TOPICAL_OIL | Freq: Two times a day (BID) | OTIC | 6 refills | Status: AC | PRN

## 2023-05-20 MED ORDER — KETOCONAZOLE 2 % EX SHAM: MEDICATED_SHAMPOO | CUTANEOUS | 11 refills | Status: AC

## 2023-05-20 MED ORDER — FENOFIBRATE 160 MG PO TABS
160.0000 mg | ORAL_TABLET | Freq: Every day | ORAL | 3 refills | Status: AC
Start: 2023-05-20 — End: ?

## 2023-05-20 NOTE — Patient Instructions (Addendum)
 Please read the attached handout on seborrheic dermatitis. Please use the ketoconazole  shampoo twice weekly as needed. Lather in the shower to affected areas of face, scalp, beard and ears. Leave on for 5 minutes prior to rinsing.  For your RIGHT EAR ONLY please continue 4 drops twice daily of the ciprodex  until the bottle is gone. Once gone, you may start the fluocinolone  oil to the RIGHT EAR ONLY. DO NOT PLACE IN LEFT EAR.  I spoke with Dr. Maximo Spar who is okay STOPPING your gemfibrozil . Switch instead to fenofibrate . Take this once daily. We will recheck your lipid panel when you return in August for your annual physical.

## 2023-05-20 NOTE — Progress Notes (Signed)
 Established Patient Office Visit  Subjective:  Patient ID: Bobby Jimenez, male    DOB: Oct 28, 1980  Age: 43 y.o. MRN: [id number]  Chief Complaint  Patient presents with   Follow-up    1 week follow up on ear.    HPI  Discussed the use of AI scribe software for clinical note transcription with the patient, who gave verbal consent to proceed.  History of Present Illness   Bobby Jimenez is a 43 year old male who presents with ear discharge and seborrheic dermatitis.  He has been experiencing persistent ear discharge, which has improved but remains present. He has a history of ear infections and previously had tubes in both ears, with one tube having fallen out. He uses hearing aids but denies any pain or discomfort from them. He does not use Q-tips or insert anything else into his ears. He has been using ear drops, four drops twice daily, and notes that the drainage in one ear has resolved, but the other ear still appears infected.  He has a history of prediabetes, with his last A1c recorded eight months ago. He was previously on diabetic medication but was taken off due to improved blood sugar levels. He mentions that his current blood sugar levels are no longer in the prediabetic range.  He also has eczema on his head and beard, which he treats with conditioner as advised by his barber. He describes the condition as itchy and scaly, particularly around the nasolabial folds and beard area. He uses a rag to clean the outside of his ears but avoids inserting anything into the ear canal.  He mentions a spot on his arm that appeared after sun exposure, which he suspects might be a burn or a result of grease from work. No itchiness and he is not concerned about it being an allergic reaction.      Patient Active Problem List   Diagnosis Date Noted   OSA (obstructive sleep apnea) 03/04/2023   Rectal bleeding 08/28/2021   Increased bowel frequency 08/28/2021   Irritable bowel  syndrome with diarrhea 05/05/2019   Obesity (BMI 30-39.9) 01/07/2019   Abdominal pain 12/30/2018   Fatty liver disease, nonalcoholic 12/30/2018   Sinus tachycardia 12/27/2016   Atelectasis 12/27/2016   Empyema of gallbladder    Sepsis due to undetermined organism (HCC) 12/23/2016   Hyponatremia 12/23/2016   Hypokalemia 12/23/2016   Calculus of gallbladder with acute cholecystitis without obstruction    Gastroesophageal reflux disease 09/15/2014   Nausea with vomiting 09/15/2014   Diarrhea 09/15/2014   Left sided abdominal pain 09/15/2014   Alteration consciousness 12/23/2013   Hypertension    Anxiety    Dizziness    Lightheaded    Past Medical History:  Diagnosis Date   ADHD (attention deficit hyperactivity disorder)    Anxiety    Bilateral carpal tunnel syndrome    transient relief from inj by ortho 04/2020; NCS confirmed bilat CTS->surgery planned as of 05/2020 ortho f/u   Bipolar disorder (HCC)    Cervical radiculopathy    Diabetes mellitus (HCC) 08/2020   Aug 2022->fasting gluc 130, Hba1c 7.6%   Dizziness    lightheaded   Food allergy    pineapple   GERD (gastroesophageal reflux disease)    Headache syndrome    Hepatic steatosis    u/s and CT confirmed 2020--no cirrhosis.   Hypertension    Hypertriglyceridemia    Very high (>800 while on vascepa  and rosuva 06/2019)->rosuva and vascepa  d/c'd and gemfibrozil  started, also  referred pt to advanced lipid clinic.   IBS (irritable bowel syndrome)    D.  bentyl  helping ([facility])   Latex allergy    Low back pain    Mixed hyperlipidemia    OSA on CPAP 10/16/2016   Tremors of nervous system    arms, dx'd as lasting side effect from lithium use in remote past: psych has him on amantadine  for this.   Vertigo    Past Surgical History:  Procedure Laterality Date   CARPAL TUNNEL RELEASE Left    L 05/2020.  Plan for R side to be done.   CARPAL TUNNEL RELEASE Right    07/2020   CHOLECYSTECTOMY N/A 12/26/2016    Procedure: LAPAROSCOPIC CHOLECYSTECTOMY;  Surgeon: Alanda Allegra, MD;  Location: [facility];  Service: General;  Laterality: N/A;   KNEE SURGERY Right 1997   arthroscopic   polysomnogram  09/2016   Mild/mod OSA->CPAP (Dr. Lonne Roan)   WISDOM TOOTH EXTRACTION  1997   Social History   Tobacco Use   Smoking status: Every Day    Current packs/day: 1.00    Average packs/day: 1 pack/day for 10.0 years (10.0 ttl pk-yrs)    Types: Cigarettes   Smokeless tobacco: Former    Types: Snuff   Tobacco comments:    quit Nov 2018.  Started back in July of 2024, smoking ,1/2 ppd.  03/04/2023 hfb  Vaping Use   Vaping status: Never Used  Substance Use Topics   Alcohol use: Yes    Alcohol/week: 1.0 standard drink of alcohol    Types: 1 Cans of beer per week    Comment: heavy in past, short period of time in 20s.; 05/24/20 occ   Drug use: No    Comment: Quit 1996      ROS: as noted in HPI  Objective:     BP 119/76   Pulse 77   Wt 249 lb (112.9 kg)   SpO2 97%   BMI 36.77 kg/m  BP Readings from Last 3 Encounters:  05/20/23 119/76  05/10/23 128/79  04/25/23 110/60   Wt Readings from Last 3 Encounters:  05/20/23 249 lb (112.9 kg)  05/10/23 251 lb (113.9 kg)  04/25/23 248 lb 12.8 oz (112.9 kg)      Physical Exam Vitals and nursing note reviewed.  Constitutional:      General: He is not in acute distress.    Appearance: Normal appearance. He is obese. He is not ill-appearing, toxic-appearing or diaphoretic.  HENT:     Head: Normocephalic.     Right Ear: Drainage present. No swelling or tenderness. No mastoid tenderness. No PE tube. Tympanic membrane is scarred. Tympanic membrane is not injected or erythematous.     Left Ear: No drainage, swelling or tenderness. No mastoid tenderness. A PE tube is present. Tympanic membrane is scarred. Tympanic membrane is not injected or erythematous.     Ears:     Comments: Significant scaling, flaking and dryness to B external ear, R>L TM tube in place  on L, patent    Nose: Nose normal.     Mouth/Throat:     Mouth: Mucous membranes are moist.  Eyes:     General: No scleral icterus.       Right eye: No discharge.        Left eye: No discharge.     Extraocular Movements: Extraocular movements intact.     Pupils: Pupils are equal, round, and reactive to light.  Cardiovascular:     Rate  and Rhythm: Normal rate.  Pulmonary:     Effort: Pulmonary effort is normal. No respiratory distress.  Lymphadenopathy:     Cervical: No cervical adenopathy.  Skin:    General: Skin is warm and dry.     Findings: Erythema (with flaking noted to nasolabial folds and beard) present.  Neurological:     Mental Status: He is alert and oriented to person, place, and time.      No results found for any visits on 05/20/23.  Last CBC Lab Results  Component Value Date   WBC 6.7 08/29/2021   HGB 13.3 08/29/2021   HCT 37.5 08/29/2021   MCV 90 08/29/2021   MCH 32.0 08/29/2021   RDW 14.0 08/29/2021   PLT 234 08/29/2021   Last metabolic panel Lab Results  Component Value Date   GLUCOSE 104 (H) 08/30/2022   NA 137 08/30/2022   K 4.1 08/30/2022   CL 102 08/30/2022   CO2 26 08/30/2022   BUN 16 08/30/2022   CREATININE 0.83 08/30/2022   GFR 107.11 08/30/2022   CALCIUM  9.8 08/30/2022   PROT 7.4 08/30/2022   ALBUMIN 4.8 08/30/2022   BILITOT 0.4 08/30/2022   ALKPHOS 67 08/30/2022   AST 19 08/30/2022   ALT 27 08/30/2022   ANIONGAP 9 01/25/2021   Last lipids Lab Results  Component Value Date   CHOL 153 04/25/2023   HDL 36 (L) 04/25/2023   LDLCALC 66 04/25/2023   LDLDIRECT 76.0 08/30/2022   TRIG 324 (H) 04/25/2023   CHOLHDL 4.3 04/25/2023   Last hemoglobin A1c Lab Results  Component Value Date   HGBA1C 5.6 05/20/2023   HGBA1C 5.6 05/20/2023   HGBA1C 5.6 (A) 05/20/2023   HGBA1C 5.6 05/20/2023      The 10-year ASCVD risk score (Arnett DK, et al., 2019) is: 9.4%  Assessment & Plan:  Acute diffuse otitis externa of both  ears  Prediabetes -     POCT glycosylated hemoglobin (Hb A1C)  Primary hypertriglyceridemia -     Fenofibrate ; Take 1 tablet (160 mg total) by mouth daily.  Dispense: 90 tablet; Refill: 3  Seborrheic dermatitis -     Ketoconazole ; Apply to scalp twice a week for 8 weeks and then weekly thereafter; Lather, leave on for 5 min prior to rinsing  Dispense: 120 mL; Refill: 11  Eczema of right external ear -     Fluocinolone  Acetonide; Place 5 drops in ear(s) 2 (two) times daily as needed.  Dispense: 20 mL; Refill: 6  Assessment and Plan    Recurrent Otitis Externa Persistent R ear infection despite treatment, possibly due to eczema or seborrheic dermatitis. L ear resolved. Left ear tube in place. - Continue four drops of current ear medication in the right ear twice daily until finished. - Prescribe fluocinolone  oil for the right ear only.  Seborrheic Dermatitis Affects beard and nasolabial folds, contributing to ear irritation. Focus on symptom control. - Prescribe ketoconazole  shampoo twice weekly, lather and leave for five minutes. - Monitor skin and nasolabial folds; apply small amount to external ear if symptoms improve.  Prediabetes A1c now below prediabetic range. Blood sugar levels well-controlled.  Hyperlipidemia On gemfibrozil , considering switch to fenofibrate  for better efficacy. Spoke with Dr. Maximo Spar who is okay with switching to 160mg  fenofibrate . - OK with cardiology to switch to fenofibrate  160mg  - STOP gemfibrozil .  General Health Maintenance Discussed ear care and avoidance of inserting objects into ear canal. - Advise against inserting objects into ear canal, continue current ear hygiene.  Return in about 4 months (around 09/19/2023) for Annual Physical.   Mandy Second, PA

## 2023-05-31 ENCOUNTER — Other Ambulatory Visit: Payer: Self-pay | Admitting: Internal Medicine

## 2023-06-11 ENCOUNTER — Other Ambulatory Visit: Payer: Self-pay | Admitting: Family Medicine

## 2023-06-11 DIAGNOSIS — I10 Essential (primary) hypertension: Secondary | ICD-10-CM

## 2023-06-14 ENCOUNTER — Ambulatory Visit (HOSPITAL_BASED_OUTPATIENT_CLINIC_OR_DEPARTMENT_OTHER): Attending: Adult Health | Admitting: Pulmonary Disease

## 2023-06-14 DIAGNOSIS — G4733 Obstructive sleep apnea (adult) (pediatric): Secondary | ICD-10-CM | POA: Diagnosis present

## 2023-06-21 DIAGNOSIS — G4733 Obstructive sleep apnea (adult) (pediatric): Secondary | ICD-10-CM

## 2023-06-21 NOTE — Procedures (Signed)
 [facility] [address] [place], [place] [address] Tel: [phone]   Fax: [phone]  Titration Interpretation  Patient Name:  Bobby Jimenez, Bobby Jimenez Date:  06/14/2023 Referring Physician:  Drema Genta NP  Indications for Polysomnography The patient is a 43 year old Male who is 5\' 9"  and weighs 249.0 lbs. His BMI equals 36.9.  A full night titration treatment study was performed. PSG 10/16/16 >> AHI 13.1, SpO2 low 88% PSG 10/30/17 >> AHI 32.1, SpO2 low 88%; CPAP 8 cm H2O   Medication was taken at 9 pm.  -  Lopressor   Vascepa   Abilify   Symmetrel    Polysomnogram Data A full night polysomnogram recorded the standard physiologic parameters including EEG, EOG, EMG, EKG, nasal and oral airflow.  Respiratory parameters of chest and abdominal movements were recorded with Respiratory Inductance Plethysmography belts.  Oxygen saturation was recorded by pulse oximetry.   Sleep Architecture The total recording time of the polysomnogram was 389.3 minutes.  The total sleep time was 346.0 minutes.  The patient spent 4.9% of total sleep time in Stage N1, 63.3% in Stage N2, 0.0% in Stages N3, and 31.8% in REM.  Sleep latency was 10.1 minutes.  REM latency was 101.0 minutes.  Sleep Efficiency was 88.9%.  Wake after Sleep Onset time was 33.0 minutes.  Titration Summary The patient was titrated at pressures ranging from 8* cm/H20 up to 16* cm/H20. The last pressure used in the study was 16* cm/H20 .  Respiratory Events The polysomnogram revealed a presence of 1 obstructive, 1 central, and - mixed apneas resulting in an Apnea index of 0.3 events per hour.  There were 35 hypopneas (>=3% desaturation and/or arousal) resulting in an Apnea\Hypopnea Index (AHI >=3% desaturation and/or arousal) of 6.4 events per hour.  There were 26 hypopneas (>=4% desaturation) resulting in an Apnea\Hypopnea Index (AHI >=4% desaturation) of 4.9 events per hour.  There were 26  Respiratory Effort Related Arousals resulting in a RERA index of 4.5 events per hour. The Respiratory Disturbance Index is 10.9 events per hour.  The snore index was - events per hour.  Mean oxygen saturation was 95.7%.  The lowest oxygen saturation during sleep was 87.0%.  Time spent <=88% oxygen saturation was 0.6 minutes (0.1%).  Limb Activity There were - limb movements recorded.  Of this total, - were classified as PLMs.  Of the PLMs, - were associated with arousals.  The Limb Movement index was - per hour while the PLM index was - per hour.  Cardiac Summary The average pulse rate was 66.9 bpm.  The minimum pulse rate was 54.0 bpm while the maximum pulse rate was 88.0 bpm.  Cardiac rhythm was normal/abnormal.  Diagnosis: Severe OSA corrected by CPAP 15-16 cm  Recommendations: Reset CPAP to 15-16 cm or auto settings 12-16cm He tolerated medium airfit F20 fullface mask well   This study was personally reviewed and electronically signed by: Celene Coins, Md Accredited Board Certified in Sleep Medicine

## 2023-06-26 ENCOUNTER — Other Ambulatory Visit: Payer: Self-pay | Admitting: Family Medicine

## 2023-06-26 ENCOUNTER — Ambulatory Visit: Payer: Self-pay | Admitting: Adult Health

## 2023-07-01 NOTE — Progress Notes (Signed)
 Patient is scheduled to see Bobby Clark NP on 08/19/23.  Nothing further needed.

## 2023-07-01 NOTE — Progress Notes (Signed)
 Called and left a VM on Friday, 5/30 for patient to call to schedule f/u in 4-6 weeks.

## 2023-07-10 ENCOUNTER — Other Ambulatory Visit: Payer: Self-pay | Admitting: Family Medicine

## 2023-07-10 DIAGNOSIS — I10 Essential (primary) hypertension: Secondary | ICD-10-CM

## 2023-07-16 ENCOUNTER — Other Ambulatory Visit: Payer: Self-pay | Admitting: Gastroenterology

## 2023-07-25 ENCOUNTER — Other Ambulatory Visit: Payer: Self-pay

## 2023-07-25 MED ORDER — METOPROLOL TARTRATE 25 MG PO TABS: 25.0000 mg | ORAL_TABLET | Freq: Two times a day (BID) | ORAL | 1 refills | Status: DC

## 2023-08-19 ENCOUNTER — Telehealth: Payer: Self-pay

## 2023-08-19 ENCOUNTER — Ambulatory Visit: Admitting: Adult Health

## 2023-08-19 ENCOUNTER — Encounter: Payer: Self-pay | Admitting: Adult Health

## 2023-08-19 VITALS — BP 114/68 | HR 77 | Temp 98.0°F | Ht 69.0 in | Wt 257.4 lb

## 2023-08-19 DIAGNOSIS — E669 Obesity, unspecified: Secondary | ICD-10-CM

## 2023-08-19 DIAGNOSIS — G4733 Obstructive sleep apnea (adult) (pediatric): Secondary | ICD-10-CM | POA: Diagnosis not present

## 2023-08-19 DIAGNOSIS — F17210 Nicotine dependence, cigarettes, uncomplicated: Secondary | ICD-10-CM | POA: Diagnosis not present

## 2023-08-19 DIAGNOSIS — Z6838 Body mass index (BMI) 38.0-38.9, adult: Secondary | ICD-10-CM

## 2023-08-19 NOTE — Telephone Encounter (Signed)
 Patient is scheduled to see Tammy Parrett today. She should have the compliance report from Cpap

## 2023-08-19 NOTE — Progress Notes (Signed)
 @Patient  ID: Bobby Jimenez, male    DOB: 03-Jan-1981, 43 y.o.   MRN: [id number]  Chief Complaint  Patient presents with   Follow-up    OSA on cpap     Referring provider: Candise Aleene DEL, MD  HPI: 43 year old male followed for severe obstructive sleep apnea  TEST/EVENTS :  PSG 10/16/16 >> AHI 13.1, SpO2 low 88% PSG 10/30/17 >> AHI 32.1, SpO2 low 88%; CPAP 8 cm H2O  08/19/2023 Follow up: OSA Discussed the use of AI scribe software for clinical note transcription with the patient, who gave verbal consent to proceed.  History of Present Illness   Bobby Jimenez is a 43 year old male who presents for 6 month follow-up for OSA on CPAP therapy. Patient says he is doing very well on CPAP.  Feels that he is benefiting from CPAP.  Patient was having ongoing residual events on CPAP on auto titration mode.  He was set up for CPAP titration study that was completed on Jun 14, 2023 that showed optimal control on CPAP 15 to 16 cm H2O.  He says he is doing well on CPAP wears a CPAP most every night. CPAP download shows 80% compliance, daily average usage at 6.5 hours.  Patient is on CPAP 16 cm H2O.  AHI 0.5/hour.  Positive mask leaks.  He uses a full face mask, which is comfortable, although there is mask leakage due to facial hair. The leakage does not disturb his sleep.  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 He manages to use the CPAP machine for an average of six to seven hours per night, although there are occasional nights when he does not use it due to long work schedule   He has been experiencing issues with [employer] regarding billing and compliance with Medicare requirements. [facility] has charged him for the CPAP machine and insists on a sleep doctor consultation, despite his recent CPAP study. He has been using CPAP therapy for about ten years and switched to [employer] a couple of years ago due to  Medicare's contract changes.  He continues to smoke and is working on quitting. Discussed smoking cessation.         Allergies  Allergen Reactions   Pineapple Shortness Of Breath and Swelling   Latex Swelling   Morphine And Codeine Hives and Itching   Statins     locks urinary tract up    Immunization History  Administered Date(s) Administered   Influenza, Seasonal, Injecte, Preservative Fre 01/14/2023   Influenza,inj,Quad PF,6+ Mos 12/01/2020   Moderna Covid-19 Vaccine Bivalent Booster 84yrs & up 12/14/2020   Moderna SARS-COV2 Booster Vaccination 09/02/2020   Moderna Sars-Covid-2 Vaccination 05/28/2019, 06/25/2019   PNEUMOCOCCAL CONJUGATE-20 12/01/2020   Pneumococcal Polysaccharide-23 11/29/2016   Tdap 01/29/2013, 09/04/2020    Past Medical History:  Diagnosis Date   ADHD (attention deficit hyperactivity disorder)    Anxiety    Bilateral carpal tunnel syndrome    transient relief from inj by ortho 04/2020; NCS confirmed bilat CTS->surgery planned as of 05/2020 ortho f/u   Bipolar disorder (HCC)    Cervical radiculopathy    Diabetes mellitus (HCC) 08/2020   Aug 2022->fasting gluc 130, Hba1c 7.6%   Dizziness    lightheaded   Food allergy    pineapple   GERD (gastroesophageal reflux disease)    Headache syndrome    Hepatic steatosis    u/s and  CT confirmed 2020--no cirrhosis.   Hypertension    Hypertriglyceridemia    Very high (>800 while on vascepa  and rosuva 06/2019)->rosuva and vascepa  d/c'd and gemfibrozil  started, also referred pt to advanced lipid clinic.   IBS (irritable bowel syndrome)    D.  bentyl  helping ([facility])   Latex allergy    Low back pain    Mixed hyperlipidemia    OSA on CPAP 10/16/2016   Tremors of nervous system    arms, dx'd as lasting side effect from lithium use in remote past: psych has him on amantadine  for this.   Vertigo     Tobacco History: Social History   Tobacco Use  Smoking Status Every Day   Current  packs/day: 1.00   Average packs/day: 1 pack/day for 10.0 years (10.0 ttl pk-yrs)   Types: Cigarettes  Smokeless Tobacco Former   Types: Snuff  Tobacco Comments   quit Nov 2018.  Started back in July of 2024, smoking ,1/2 ppd.  03/04/2023 hfb   Ready to quit: No Counseling given: Yes Tobacco comments: quit Nov 2018.  Started back in July of 2024, smoking ,1/2 ppd.  03/04/2023 hfb   Outpatient Medications Prior to Visit  Medication Sig Dispense Refill   amantadine  (SYMMETREL ) 100 MG capsule Take 1 capsule (100 mg total) by mouth 2 (two) times daily. 60 capsule 0   ARIPiprazole  (ABILIFY ) 15 MG tablet Take 7.5 mg by mouth 2 (two) times daily.     clonazePAM  (KLONOPIN ) 0.5 MG tablet Take 0.25-0.5 mg by mouth 2 (two) times daily. Takes 1/2 tablet in am and 1 tablet at bedtime     fenofibrate  160 MG tablet Take 1 tablet (160 mg total) by mouth daily. 90 tablet 3   Fluocinolone  Acetonide 0.01 % OIL Place 5 drops in ear(s) 2 (two) times daily as needed. 20 mL 6   fluticasone  (FLONASE ) 50 MCG/ACT nasal spray INSTILL 2 SPRAYS INTO EACH NOSTRIL DAILY. 16 g 3   icosapent  Ethyl (VASCEPA ) 1 g capsule Take 2 capsules (2 g total) by mouth 2 (two) times daily. 360 capsule 2   ketoconazole  (NIZORAL ) 2 % shampoo Apply to scalp twice a week for 8 weeks and then weekly thereafter; Lather, leave on for 5 min prior to rinsing 120 mL 11   lisinopril  (ZESTRIL ) 10 MG tablet Take 1 tablet (10 mg total) by mouth daily. 90 tablet 0   meloxicam (MOBIC) 15 MG tablet Take 15 mg by mouth daily.     metoprolol  tartrate (LOPRESSOR ) 25 MG tablet Take 1 tablet (25 mg total) by mouth 2 (two) times daily. 60 tablet 1   Multiple Vitamin (MULTIVITAMIN) tablet Take 1 tablet by mouth daily.     pantoprazole  (PROTONIX ) 20 MG tablet TAKE ONE TABLET BY MOUTH ONCE DAILY. 30 tablet 1   rosuvastatin  (CRESTOR ) 10 MG tablet TAKE ONE TABLET BY MOUTH ONCE DAILY. 90 tablet 3   traZODone (DESYREL) 50 MG tablet Take 50 mg by mouth at bedtime as  needed.     ZOLOFT  100 MG tablet Take 200 mg by mouth daily.     No facility-administered medications prior to visit.     Review of Systems:   Constitutional:   No  weight loss, night sweats,  Fevers, chills, fatigue, or  lassitude.  HEENT:   No headaches,  Difficulty swallowing,  Tooth/dental problems, or  Sore throat,                No sneezing, itching, ear ache, nasal  congestion, post nasal drip,   CV:  No chest pain,  Orthopnea, PND, swelling in lower extremities, anasarca, dizziness, palpitations, syncope.   GI  No heartburn, indigestion, abdominal pain, nausea, vomiting, diarrhea, change in bowel habits, loss of appetite, bloody stools.   Resp: No shortness of breath with exertion or at rest.  No excess mucus, no productive cough,  No non-productive cough,  No coughing up of blood.  No change in color of mucus.  No wheezing.  No chest wall deformity  Skin: no rash or lesions.  GU: no dysuria, change in color of urine, no urgency or frequency.  No flank pain, no hematuria   MS:  No joint pain or swelling.  No decreased range of motion.  No back pain.    Physical Exam  BP 114/68 (BP Location: Left Arm, Patient Position: Sitting, Cuff Size: Large)   Pulse 77   Temp 98 F (36.7 C) (Oral)   Ht 5' 9 (1.753 m)   Wt 257 lb 6.4 oz (116.8 kg)   SpO2 97%   BMI 38.01 kg/m   GEN: A/Ox3; pleasant , NAD, well nourished    HEENT:  [place]/AT,  NOSE-clear, THROAT-clear, no lesions, no postnasal drip or exudate noted.   NECK:  Supple w/ fair ROM; no JVD; normal carotid impulses w/o bruits; no thyromegaly or nodules palpated; no lymphadenopathy.    RESP  Clear  P & A; w/o, wheezes/ rales/ or rhonchi. no accessory muscle use, no dullness to percussion  CARD:  RRR, no m/r/g, no peripheral edema, pulses intact, no cyanosis or clubbing.  GI:   Soft & nt; nml bowel sounds; no organomegaly or masses detected.   Musco: Warm bil, no deformities or joint swelling noted.   Neuro: alert,  no focal deficits noted.    Skin: Warm, no lesions or rashes    Lab Results:      BNP No results found for: BNP  ProBNP No results found for: PROBNP  Imaging: No results found.  Administration History     None           No data to display          No results found for: NITRICOXIDE      Assessment & Plan:   Assessment and Plan    Obstructive Sleep Apnea   Obstructive sleep apnea is well-controlled with current CPAP settings.  He has perceived benefit and excellent compliance.  A recent in-lab sleep study optimal control on CPAP 16 cm H2O.  Mask leakage is noted due to facial hair but does not cause significant disturbance. He uses a full face mask, achieving an average of 6-7 hours of usage per night. Maintain current CPAP pressure settings at 15-16 cm H2O.  Consider trimming facial hair to improve mask seal if leakage becomes bothersome. Use distilled water in the CPAP machine. Continue efforts on weight loss. CPAP care discussed   Morbid obesity-continue with healthy weight loss   Plan  Patient Instructions  Continue on CPAP At bedtime , wear all night long for at least 6hr or more .  Work on healthy weight loss Do not drive if sleepy  Follow up in 1 year with Dr. Neda or Avaiah Stempel NP and As needed         Madelin Stank, NP 08/19/2023

## 2023-08-19 NOTE — Patient Instructions (Signed)
 Continue on CPAP At bedtime , wear all night long for at least 6hr or more .  Work on healthy weight loss Do not drive if sleepy  Follow up in 1 year with Dr. Neda or Demond Shallenberger NP and As needed

## 2023-09-18 ENCOUNTER — Other Ambulatory Visit: Payer: Self-pay | Admitting: Gastroenterology

## 2023-09-18 ENCOUNTER — Other Ambulatory Visit: Payer: Self-pay | Admitting: Family Medicine

## 2023-09-20 ENCOUNTER — Encounter: Payer: Self-pay | Admitting: Family Medicine

## 2023-09-20 ENCOUNTER — Ambulatory Visit: Admitting: Family Medicine

## 2023-09-20 VITALS — BP 99/62 | HR 74 | Temp 97.8°F | Ht 70.0 in | Wt 259.4 lb

## 2023-09-20 DIAGNOSIS — E119 Type 2 diabetes mellitus without complications: Secondary | ICD-10-CM | POA: Diagnosis not present

## 2023-09-20 DIAGNOSIS — I10 Essential (primary) hypertension: Secondary | ICD-10-CM | POA: Diagnosis not present

## 2023-09-20 DIAGNOSIS — E782 Mixed hyperlipidemia: Secondary | ICD-10-CM

## 2023-09-20 LAB — LDL CHOLESTEROL, DIRECT: Direct LDL: 66 mg/dL

## 2023-09-20 LAB — CBC WITH DIFFERENTIAL/PLATELET
Basophils Absolute: 0 K/uL (ref 0.0–0.1)
Basophils Relative: 0.4 % (ref 0.0–3.0)
Eosinophils Absolute: 0.2 K/uL (ref 0.0–0.7)
Eosinophils Relative: 2.3 % (ref 0.0–5.0)
HCT: 38.3 % — ABNORMAL LOW (ref 39.0–52.0)
Hemoglobin: 13.2 g/dL (ref 13.0–17.0)
Lymphocytes Relative: 22.9 % (ref 12.0–46.0)
Lymphs Abs: 1.7 K/uL (ref 0.7–4.0)
MCHC: 34.4 g/dL (ref 30.0–36.0)
MCV: 91.4 fl (ref 78.0–100.0)
Monocytes Absolute: 0.5 K/uL (ref 0.1–1.0)
Monocytes Relative: 6.4 % (ref 3.0–12.0)
Neutro Abs: 5.1 K/uL (ref 1.4–7.7)
Neutrophils Relative %: 68 % (ref 43.0–77.0)
Platelets: 236 K/uL (ref 150.0–400.0)
RBC: 4.19 Mil/uL — ABNORMAL LOW (ref 4.22–5.81)
RDW: 14 % (ref 11.5–15.5)
WBC: 7.5 K/uL (ref 4.0–10.5)

## 2023-09-20 LAB — COMPREHENSIVE METABOLIC PANEL WITH GFR
ALT: 22 U/L (ref 0–53)
AST: 15 U/L (ref 0–37)
Albumin: 4.5 g/dL (ref 3.5–5.2)
Alkaline Phosphatase: 42 U/L (ref 39–117)
BUN: 21 mg/dL (ref 6–23)
CO2: 26 meq/L (ref 19–32)
Calcium: 9.6 mg/dL (ref 8.4–10.5)
Chloride: 101 meq/L (ref 96–112)
Creatinine, Ser: 0.83 mg/dL (ref 0.40–1.50)
GFR: 106.32 mL/min (ref >60.00)
Glucose, Bld: 90 mg/dL (ref 70–99)
Potassium: 4.3 meq/L (ref 3.5–5.1)
Sodium: 138 meq/L (ref 135–145)
Total Bilirubin: 0.3 mg/dL (ref 0.2–1.2)
Total Protein: 7 g/dL (ref 6.0–8.3)

## 2023-09-20 LAB — POCT GLYCOSYLATED HEMOGLOBIN (HGB A1C)
HbA1c POC (<> result, manual entry): 5.5 % (ref 4.0–5.6)
HbA1c, POC (controlled diabetic range): 5.5 % (ref 0.0–7.0)
HbA1c, POC (prediabetic range): 5.5 % — AB (ref 5.7–6.4)
Hemoglobin A1C: 5.5 % (ref 4.0–5.6)

## 2023-09-20 LAB — LIPID PANEL
Cholesterol: 151 mg/dL (ref 0–200)
HDL: 36.8 mg/dL — ABNORMAL LOW (ref >39.00)
NonHDL: 114.37
Total CHOL/HDL Ratio: 4
Triglycerides: 603 mg/dL — ABNORMAL HIGH (ref 0.0–149.0)
VLDL: 120.6 mg/dL — ABNORMAL HIGH (ref 0.0–40.0)

## 2023-09-20 LAB — MICROALBUMIN / CREATININE URINE RATIO
Creatinine,U: 80.7 mg/dL
Microalb Creat Ratio: UNDETERMINED mg/g (ref 0.0–30.0)
Microalb, Ur: <0.7 mg/dL

## 2023-09-20 LAB — TSH: TSH: 3.97 u[IU]/mL (ref 0.35–5.50)

## 2023-09-20 MED ORDER — LISINOPRIL 10 MG PO TABS: 10.0000 mg | ORAL_TABLET | Freq: Every day | ORAL | 3 refills | Status: AC

## 2023-09-20 MED ORDER — METOPROLOL TARTRATE 25 MG PO TABS: 25.0000 mg | ORAL_TABLET | Freq: Two times a day (BID) | ORAL | 3 refills | Status: AC

## 2023-09-20 NOTE — Progress Notes (Signed)
 OFFICE VISIT  09/20/2023  CC:  Chief Complaint  Patient presents with   Annual Exam    Pt is fasting    Patient is a 43 y.o. male who presents for 1 year follow-up diabetes, hypertension, and hyperlipidemia. A/P as of last visit: DM 2, great control.  Was on pioglitazone  at one point but got off when A1c dropped significantly.   POC Hba1c today is 5.7%. Urine microalb/cr today.   #2, hypertension.  Well-controlled on lisinopril  10 mg a day and Lopressor  25 mg twice a day.  Electrolytes and creatinine today.   3.  Mixed hyperlipidemia.  His main issue has been very high triglycerides. Followed by Dr. Mona with [facility]. Currently on gemfibrozil  600 mg twice a day and Vascepa  2 g twice a day, and rosuvastatin  10 mg a day.  He has follow-up with Dr. Mona on 10/04/2022. Lipid panel and hepatic panel today.  INTERIM HX: Feeling well other than being tired from working all the time. Allergies bothering him more lately since the weather has cooled down some.  No dizziness or acute periods of fatigue.   No home blood pressure or glucose monitoring.  He sees Dr. Lynnann in psychiatry.  Feels like everything is stable from that standpoint.  ROS as above, plus--> no fevers, no CP, no SOB, no wheezing, no cough, no dizziness, no HAs, no rashes, no melena/hematochezia.  No polyuria or polydipsia.  No myalgias or arthralgias.  No focal weakness, paresthesias, or tremors.  No acute vision or hearing abnormalities.  No dysuria or unusual/new urinary urgency or frequency.  No recent changes in lower legs. No n/v/d or abd pain.  No palpitations.     Past Medical History:  Diagnosis Date   ADHD (attention deficit hyperactivity disorder)    Anxiety    Bilateral carpal tunnel syndrome    transient relief from inj by ortho 04/2020; NCS confirmed bilat CTS->surgery planned as of 05/2020 ortho f/u   Bipolar disorder (HCC)    Cervical radiculopathy    Diabetes mellitus (HCC) 08/2020    Aug 2022->fasting gluc 130, Hba1c 7.6%   Dizziness    lightheaded   Food allergy    pineapple   GERD (gastroesophageal reflux disease)    Headache syndrome    Hepatic steatosis    u/s and CT confirmed 2020--no cirrhosis.   Hypertension    Hypertriglyceridemia    Very high (>800 while on vascepa  and rosuva 06/2019)->rosuva and vascepa  d/c'd and gemfibrozil  started, also referred pt to advanced lipid clinic.   IBS (irritable bowel syndrome)    D.  bentyl  helping ([facility])   Latex allergy    Low back pain    Mixed hyperlipidemia    OSA on CPAP 10/16/2016   Tremors of nervous system    arms, dx'd as lasting side effect from lithium use in remote past: psych has him on amantadine  for this.   Vertigo     Past Surgical History:  Procedure Laterality Date   CARPAL TUNNEL RELEASE Left    L 05/2020.  Plan for R side to be done.   CARPAL TUNNEL RELEASE Right    07/2020   CHOLECYSTECTOMY N/A 12/26/2016   Procedure: LAPAROSCOPIC CHOLECYSTECTOMY;  Surgeon: Mavis Anes, MD;  Location: [facility];  Service: General;  Laterality: N/A;   KNEE SURGERY Right 1997   arthroscopic   polysomnogram  09/2016   Mild/mod OSA->CPAP (Dr. Electa)   WISDOM TOOTH EXTRACTION  1997    Outpatient Medications  Prior to Visit  Medication Sig Dispense Refill   amantadine  (SYMMETREL ) 100 MG capsule Take 1 capsule (100 mg total) by mouth 2 (two) times daily. 60 capsule 0   ARIPiprazole  (ABILIFY ) 15 MG tablet Take 7.5 mg by mouth 2 (two) times daily.     clonazePAM  (KLONOPIN ) 0.5 MG tablet Take 0.25-0.5 mg by mouth 2 (two) times daily. Takes 1/2 tablet in am and 1 tablet at bedtime     fenofibrate  160 MG tablet Take 1 tablet (160 mg total) by mouth daily. 90 tablet 3   Fluocinolone  Acetonide 0.01 % OIL Place 5 drops in ear(s) 2 (two) times daily as needed. 20 mL 6   fluticasone  (FLONASE ) 50 MCG/ACT nasal spray INSTILL 2 SPRAYS INTO EACH NOSTRIL DAILY. 16 g 3   icosapent  Ethyl (VASCEPA ) 1 g capsule Take 2  capsules (2 g total) by mouth 2 (two) times daily. 360 capsule 2   ketoconazole  (NIZORAL ) 2 % shampoo Apply to scalp twice a week for 8 weeks and then weekly thereafter; Lather, leave on for 5 min prior to rinsing 120 mL 11   meloxicam (MOBIC) 15 MG tablet Take 15 mg by mouth daily.     Multiple Vitamin (MULTIVITAMIN) tablet Take 1 tablet by mouth daily.     pantoprazole  (PROTONIX ) 20 MG tablet TAKE ONE TABLET BY MOUTH ONCE DAILY. 30 tablet 1   rosuvastatin  (CRESTOR ) 10 MG tablet TAKE ONE TABLET BY MOUTH ONCE DAILY. 90 tablet 3   traZODone (DESYREL) 50 MG tablet Take 50 mg by mouth at bedtime as needed.     ZOLOFT  100 MG tablet Take 200 mg by mouth daily.     lisinopril  (ZESTRIL ) 10 MG tablet Take 1 tablet (10 mg total) by mouth daily. 90 tablet 0   metoprolol  tartrate (LOPRESSOR ) 25 MG tablet Take 1 tablet (25 mg total) by mouth 2 (two) times daily. 60 tablet 1   No facility-administered medications prior to visit.    Allergies  Allergen Reactions   Pineapple Shortness Of Breath and Swelling   Latex Swelling   Morphine And Codeine Hives and Itching   Statins     locks urinary tract up    Review of Systems As per HPI  PE:    09/20/2023    8:59 AM 08/19/2023   11:20 AM 06/14/2023    7:57 PM  Vitals with BMI  Height 5' 10 5' 9 5' 9  Weight 259 lbs 6 oz 257 lbs 6 oz 249 lbs  BMI 37.22 37.99 36.75  Systolic  114   Diastolic  68   Pulse  77      Physical Exam  Gen: Alert, well appearing.  Patient is oriented to person, place, time, and situation. AFFECT: pleasant, lucid thought and speech. ZWU:Zbzd: no injection, icteris, swelling, or exudate.  EOMI, PERRLA. Mouth: lips without lesion/swelling.  Oral mucosa pink and moist. Oropharynx without erythema, exudate, or swelling.  NECK: no mass, LAD, or TM. Carotids 2+, no bruits CV: RRR, no m/r/g.   LUNGS: CTA bilat, nonlabored resps, good aeration in all lung fields. EXT: no clubbing or cyanosis.  no edema.    LABS:   Last CBC Lab Results  Component Value Date   WBC 6.7 08/29/2021   HGB 13.3 08/29/2021   HCT 37.5 08/29/2021   MCV 90 08/29/2021   MCH 32.0 08/29/2021   RDW 14.0 08/29/2021   PLT 234 08/29/2021   Last metabolic panel Lab Results  Component Value Date   GLUCOSE 104 (  H) 08/30/2022   NA 137 08/30/2022   K 4.1 08/30/2022   CL 102 08/30/2022   CO2 26 08/30/2022   BUN 16 08/30/2022   CREATININE 0.83 08/30/2022   GFR 107.11 08/30/2022   CALCIUM  9.8 08/30/2022   PROT 7.4 08/30/2022   ALBUMIN 4.8 08/30/2022   BILITOT 0.4 08/30/2022   ALKPHOS 67 08/30/2022   AST 19 08/30/2022   ALT 27 08/30/2022   ANIONGAP 9 01/25/2021   Last lipids Lab Results  Component Value Date   CHOL 153 04/25/2023   HDL 36 (L) 04/25/2023   LDLCALC 66 04/25/2023   LDLDIRECT 76.0 08/30/2022   TRIG 324 (H) 04/25/2023   CHOLHDL 4.3 04/25/2023   Last hemoglobin A1c Lab Results  Component Value Date   HGBA1C 5.6 05/20/2023   HGBA1C 5.6 05/20/2023   HGBA1C 5.6 (A) 05/20/2023   HGBA1C 5.6 05/20/2023   Last thyroid  functions Lab Results  Component Value Date   TSH 1.620 08/29/2021   IMPRESSION AND PLAN:  #1 diabetes without complication, diet controlled. POC Hba1c today is 5.5%. Urine microalbumin/creatinine today. Metabolic panel today.  2.  Hypertension, well-controlled on lisinopril  10 mg a day and Lopressor  25 mg twice daily.  3.  Mixed hyperlipidemia. At most recent follow-up with Dr. Mona on 04/25/2023 his LDL was 66 and his triglycerides were stable at 324.  No medication changes were made (fenofibrate  160 mg daily, Vascepa  2 g twice daily, and Crestor  10 mg daily). Check lipid panel and hepatic panel today.  #4 preventative health care: PSA: start at age 40 Start colon cancer screening after age 63. Vaccines are all up-to-date.  An After Visit Summary was printed and given to the patient.  FOLLOW UP: No follow-ups on file.  Signed:  Gerlene Hockey, MD           09/20/2023

## 2023-09-20 NOTE — Patient Instructions (Signed)

## 2023-09-24 ENCOUNTER — Ambulatory Visit: Payer: Self-pay | Admitting: Family Medicine

## 2023-10-07 ENCOUNTER — Ambulatory Visit (INDEPENDENT_AMBULATORY_CARE_PROVIDER_SITE_OTHER): Admitting: Otolaryngology

## 2023-10-07 ENCOUNTER — Encounter (INDEPENDENT_AMBULATORY_CARE_PROVIDER_SITE_OTHER): Payer: Self-pay | Admitting: Otolaryngology

## 2023-10-07 VITALS — BP 138/74 | HR 78 | Ht 70.0 in | Wt 245.0 lb

## 2023-10-07 DIAGNOSIS — Z8669 Personal history of other diseases of the nervous system and sense organs: Secondary | ICD-10-CM

## 2023-10-07 DIAGNOSIS — H7202 Central perforation of tympanic membrane, left ear: Secondary | ICD-10-CM

## 2023-10-07 DIAGNOSIS — H6122 Impacted cerumen, left ear: Secondary | ICD-10-CM

## 2023-10-07 DIAGNOSIS — H906 Mixed conductive and sensorineural hearing loss, bilateral: Secondary | ICD-10-CM | POA: Diagnosis not present

## 2023-10-07 DIAGNOSIS — Z09 Encounter for follow-up examination after completed treatment for conditions other than malignant neoplasm: Secondary | ICD-10-CM

## 2023-10-07 DIAGNOSIS — Z9629 Presence of other otological and audiological implants: Secondary | ICD-10-CM

## 2023-10-07 MED ORDER — CIPROFLOXACIN-DEXAMETHASONE 0.3-0.1 % OT SUSP: 4.0000 [drp] | Freq: Two times a day (BID) | OTIC | 8 refills | Status: AC

## 2023-10-08 DIAGNOSIS — H6122 Impacted cerumen, left ear: Secondary | ICD-10-CM | POA: Insufficient documentation

## 2023-10-08 DIAGNOSIS — H7202 Central perforation of tympanic membrane, left ear: Secondary | ICD-10-CM | POA: Insufficient documentation

## 2023-10-08 DIAGNOSIS — H906 Mixed conductive and sensorineural hearing loss, bilateral: Secondary | ICD-10-CM | POA: Insufficient documentation

## 2023-10-08 NOTE — Progress Notes (Signed)
 Patient ID: Bobby Jimenez, male   DOB: [birth year], 43 y.o.   MRN: [id number]  CC: Left ear drainage  HPI: The patient is a 43 year old male who presents today complaining of left ear drainage for the past few weeks.  The patient has a history of recurrent otitis media.  He was previously treated with bilateral myringotomy and tube placement.  The right tube has since extruded.  According to the patient, he started experiencing left ear drainage a few weeks ago.  He denies any significant change in his hearing.  He has a history of bilateral mixed high-frequency hearing loss.  He currently wears bilateral hearing aids.  Exam: General: Communicates without difficulty, well nourished, no acute distress. Head: Normocephalic, no evidence injury, no tenderness, facial buttresses intact without stepoff. Face/sinus: No tenderness to palpation and percussion. Facial movement is normal and symmetric. Eyes: PERRL, EOMI. No scleral icterus, conjunctivae clear. Neuro: CN II exam reveals vision grossly intact.  No nystagmus at any point of gaze. Ears: Auricles well formed without lesions.  The right ear canal and tympanic membrane are normal.  The left ear canal is impacted with cerumen and purulent debris.  Nose: External evaluation reveals normal support and skin without lesions.  Dorsum is intact.  Anterior rhinoscopy reveals congested mucosa over anterior aspect of inferior turbinates and intact septum.  No purulence noted. Oral:  Oral cavity and oropharynx are intact, symmetric, without erythema or edema.  Mucosa is moist without lesions. Neck: Full range of motion without pain.  There is no significant lymphadenopathy.  No masses palpable.  Thyroid  bed within normal limits to palpation.  Parotid glands and submandibular glands equal bilaterally without mass.  Trachea is midline. Neuro:  CN 2-12 grossly intact.   Procedure: Left ear cerumen disimpaction Anesthesia: None Description: Under the operating  microscope, the cerumen is carefully removed with a combination of cerumen currette, alligator forceps, and suction catheters.  Purulent drainage is also debrided.  After the cerumen is removed, the left tube is noted to be in place and patent. The patient tolerated the procedure well.   Assessment: 1.  The left ear canal is noted to be impacted with cerumen and purulent debris.  After the debridement procedure, the left tube was noted to be in place and patent. 2.  The right ear canal, tympanic membrane, and middle ear space are normal. 3.  Bilateral mixed hearing loss.  Plan: 1.  Otomicroscopy with left ear cerumen disimpaction and ear canal debridement. 2.  The physical exam findings are reviewed with the patient. 3.  Ciprodex  eardrops 4 drops left ear twice daily for 2 weeks. 4.  Dry ear precautions on the left side. 5.  The patient will return for reevaluation in 3 weeks.

## 2023-11-04 ENCOUNTER — Ambulatory Visit (INDEPENDENT_AMBULATORY_CARE_PROVIDER_SITE_OTHER): Admitting: Otolaryngology

## 2023-11-27 ENCOUNTER — Other Ambulatory Visit: Payer: Self-pay | Admitting: Gastroenterology

## 2023-12-18 ENCOUNTER — Other Ambulatory Visit: Payer: Self-pay | Admitting: Family Medicine

## 2023-12-25 ENCOUNTER — Telehealth: Payer: Self-pay | Admitting: Gastroenterology

## 2023-12-25 NOTE — Telephone Encounter (Signed)
 Error

## 2024-01-05 NOTE — Progress Notes (Unsigned)
 GI Office Note    Referring Provider: Candise Aleene DEL, MD Primary Care Physician:  No primary care provider on file. Primary Gastroenterologist: Lamar HERO.Rourk, MD  Date:  01/05/2024  ID:  Bobby Jimenez, DOB April 11, 1980, MRN [id number]  Chief Complaint   No chief complaint on file.  History of Present Illness  Bobby Jimenez is a 43 y.o. male with a history of ADHD, anxiety, bipolar, HTN, hypertriglyceridemia, OSA on CPAP, diabetes, IBS, GERD, and fatty liver presenting today with complaint of ***  OV 08/28/2021 noting postprandial bowel movements with urgency and no abdominal pain for the prior 3 months.  Not specifically related to any type of food.  Typically having 3-4 bowel movements daily that were formed, no nocturnal stools.  On dicyclomine  3 times daily before meals.  Pioglitazone  DC'd a few months prior.  Denied any recent antibiotics or illness.  Reported toilet tissue hematochezia for 3 months, a total of 3 times with nothing in the stool or water.  Denies any melena or rectal pain.  GERD well controlled on pantoprazole  40 mg daily and denies any dysphagia.  Was working toward weight loss through diet and exercise and had reported 25 pound weight loss over 6 months - 1 year.  Given trial of Xifaxan .  Labs ordered.  Advised to continue dicyclomine .  Advised Preparation H twice daily for hemorrhoids.  Continue PPI daily.  CBC, TSH, celiac serologies all within normal limits.   Last visit virtually 11/15/21.***. Advised to wean pantoprazole  from 40 mg to 20 mg, GERD diet recommended. Continue dicyclomine  as needed for abdominal cramping.    Today:     Wt Readings from Last 6 Encounters:  10/07/23 245 lb (111.1 kg)  09/20/23 259 lb 6.4 oz (117.7 kg)  08/19/23 257 lb 6.4 oz (116.8 kg)  06/14/23 249 lb (112.9 kg)  05/20/23 249 lb (112.9 kg)  05/10/23 251 lb (113.9 kg)    There is no height or weight on file to calculate BMI.   Current Outpatient Medications   Medication Sig Dispense Refill   amantadine  (SYMMETREL ) 100 MG capsule Take 1 capsule (100 mg total) by mouth 2 (two) times daily. 60 capsule 0   ARIPiprazole  (ABILIFY ) 15 MG tablet Take 7.5 mg by mouth 2 (two) times daily.     clonazePAM  (KLONOPIN ) 0.5 MG tablet Take 0.25-0.5 mg by mouth 2 (two) times daily. Takes 1/2 tablet in am and 1 tablet at bedtime     fenofibrate  160 MG tablet Take 1 tablet (160 mg total) by mouth daily. 90 tablet 3   Fluocinolone  Acetonide 0.01 % OIL Place 5 drops in ear(s) 2 (two) times daily as needed. 20 mL 6   fluticasone  (FLONASE ) 50 MCG/ACT nasal spray INSTILL 2 SPRAYS INTO EACH NOSTRIL DAILY. 16 g 3   icosapent  Ethyl (VASCEPA ) 1 g capsule Take 2 capsules (2 g total) by mouth 2 (two) times daily. 360 capsule 2   ketoconazole  (NIZORAL ) 2 % shampoo Apply to scalp twice a week for 8 weeks and then weekly thereafter; Lather, leave on for 5 min prior to rinsing 120 mL 11   lisinopril  (ZESTRIL ) 10 MG tablet Take 1 tablet (10 mg total) by mouth daily. 90 tablet 3   meloxicam (MOBIC) 15 MG tablet Take 15 mg by mouth daily.     metoprolol  tartrate (LOPRESSOR ) 25 MG tablet Take 1 tablet (25 mg total) by mouth 2 (two) times daily. 180 tablet 3   Multiple Vitamin (MULTIVITAMIN) tablet Take 1 tablet  by mouth daily.     pantoprazole  (PROTONIX ) 20 MG tablet TAKE ONE TABLET BY MOUTH ONCE DAILY. 30 tablet 1   rosuvastatin  (CRESTOR ) 10 MG tablet TAKE ONE TABLET BY MOUTH ONCE DAILY. 90 tablet 3   traZODone (DESYREL) 50 MG tablet Take 50 mg by mouth at bedtime as needed.     ZOLOFT  100 MG tablet Take 200 mg by mouth daily.     No current facility-administered medications for this visit.    Past Medical History:  Diagnosis Date   ADHD (attention deficit hyperactivity disorder)    Anxiety    Bilateral carpal tunnel syndrome    transient relief from inj by ortho 04/2020; NCS confirmed bilat CTS->surgery planned as of 05/2020 ortho f/u   Bipolar disorder (HCC)    Cervical  radiculopathy    Diabetes mellitus (HCC) 08/2020   Aug 2022->fasting gluc 130, Hba1c 7.6%   Dizziness    lightheaded   Food allergy    pineapple   GERD (gastroesophageal reflux disease)    Headache syndrome    Hepatic steatosis    u/s and CT confirmed 2020--no cirrhosis.   Hypertension    Hypertriglyceridemia    Very high (>800 while on vascepa  and rosuva 06/2019)->rosuva and vascepa  d/c'd and gemfibrozil  started, also referred pt to [facility].   IBS (irritable bowel syndrome)    D.  bentyl  helping ([facility])   Latex allergy    Low back pain    Mixed hyperlipidemia    OSA on CPAP 10/16/2016   Tremors of nervous system    arms, dx'd as lasting side effect from lithium use in remote past: psych has him on amantadine  for this.   Vertigo     Past Surgical History:  Procedure Laterality Date   CARPAL TUNNEL RELEASE Left    L 05/2020.  Plan for R side to be done.   CARPAL TUNNEL RELEASE Right    07/2020   CHOLECYSTECTOMY N/A 12/26/2016   Procedure: LAPAROSCOPIC CHOLECYSTECTOMY;  Surgeon: Mavis Anes, MD;  Location: [facility];  Service: General;  Laterality: N/A;   KNEE SURGERY Right 1997   arthroscopic   polysomnogram  09/2016   Mild/mod OSA->CPAP (Dr. Electa)   WISDOM TOOTH EXTRACTION  1997    Family History  Adopted: Yes  Problem Relation Age of Onset   High blood pressure Mother    Seizures Mother     Allergies as of 01/06/2024 - Review Complete 10/07/2023  Allergen Reaction Noted   Pineapple Shortness Of Breath and Swelling 05/14/2014   Latex Swelling 05/14/2014   Morphine and codeine Hives and Itching 05/14/2014   Statins  12/30/2018    Social History   Socioeconomic History   Marital status: Divorced    Spouse name: Not on file   Number of children: 0   Years of education: 13   Highest education level: Associate degree: occupational, scientist, product/process development, or vocational program  Occupational History   Not on file  Tobacco Use   Smoking  status: Every Day    Current packs/day: 1.00    Average packs/day: 1 pack/day for 10.0 years (10.0 ttl pk-yrs)    Types: Cigarettes   Smokeless tobacco: Former    Types: Snuff   Tobacco comments:    quit Nov 2018.  Started back in July of 2024, smoking ,1/2 ppd.  03/04/2023 hfb  Vaping Use   Vaping status: Never Used  Substance and Sexual Activity   Alcohol use: Yes    Alcohol/week: 1.0  standard drink of alcohol    Types: 1 Cans of beer per week    Comment: heavy in past, short period of time in 20s.; 05/24/20 occ   Drug use: No    Comment: Quit 1996   Sexual activity: Not Currently  Other Topics Concern   Not on file  Social History Narrative   Divorced, no children.   Educ: college at [employer]   Patient lives at home with his father and unemployed.  Disability for psych reasons.   Education some college.   Both handed.   Caffeine three cups daily soda and coffee.   Alc: occ beer (remote hx of alc abuse, marijuana, acid--no IV drugs) in 2005-2006, surround his problems coping with GF's death.   Tobacco: former cig smoker, 20 pack-yr hx , quit 2018.   Social Drivers of Corporate Investment Banker Strain: Low Risk  (09/19/2023)   Overall Financial Resource Strain (CARDIA)    Difficulty of Paying Living Expenses: Not hard at all  Food Insecurity: No Food Insecurity (09/19/2023)   Hunger Vital Sign    Worried About Running Out of Food in the Last Year: Never true    Ran Out of Food in the Last Year: Never true  Transportation Needs: No Transportation Needs (09/19/2023)   PRAPARE - Administrator, Civil Service (Medical): No    Lack of Transportation (Non-Medical): No  Physical Activity: Sufficiently Active (09/19/2023)   Exercise Vital Sign    Days of Exercise per Week: 3 days    Minutes of Exercise per Session: 150+ min  Stress: No Stress Concern Present (09/19/2023)   [employer] of Occupational Health - Occupational Stress Questionnaire    Feeling of Stress: Not  at all  Social Connections: Socially Isolated (09/19/2023)   Social Connection and Isolation Panel    Frequency of Communication with Friends and Family: Once a week    Frequency of Social Gatherings with Friends and Family: Never    Attends Religious Services: 1 to 4 times per year    Active Member of [employer] or Organizations: No    Attends Engineer, Structural: Not on file    Marital Status: Divorced    Review of Systems   Gen: Denies fever, chills, anorexia. Denies fatigue, weakness, weight loss.  CV: Denies chest pain, palpitations, syncope, peripheral edema, and claudication. Resp: Denies dyspnea at rest, cough, wheezing, coughing up blood, and pleurisy. GI: See HPI Derm: Denies rash, itching, dry skin Psych: Denies depression, anxiety, memory loss, confusion. No homicidal or suicidal ideation.  Heme: Denies bruising, bleeding, and enlarged lymph nodes.  Physical Exam   There were no vitals taken for this visit.  General:   Alert and oriented. No distress noted. Pleasant and cooperative.  Head:  Normocephalic and atraumatic. Eyes:  Conjuctiva clear without scleral icterus. Mouth:  Oral mucosa pink and moist. Good dentition. No lesions. Lungs:  Clear to auscultation bilaterally. No wheezes, rales, or rhonchi. No distress.  Heart:  S1, S2 present without murmurs appreciated.  Abdomen:  +BS, soft, non-tender and non-distended. No rebound or guarding. No HSM or masses noted. Rectal: *** Msk:  Symmetrical without gross deformities. Normal posture. Extremities:  Without edema. Neurologic:  Alert and  oriented x4 Psych:  Alert and cooperative. Normal mood and affect.  Assessment & Plan  Bobby Jimenez is a 43 y.o. male presenting today with ***   Follow up   Follow up ***    Charmaine Melia, MSN, FNP-BC,  AGACNP-BC [facility]

## 2024-01-06 ENCOUNTER — Ambulatory Visit (INDEPENDENT_AMBULATORY_CARE_PROVIDER_SITE_OTHER): Admitting: Gastroenterology

## 2024-01-06 ENCOUNTER — Encounter: Payer: Self-pay | Admitting: Gastroenterology

## 2024-01-06 VITALS — BP 135/80 | HR 98 | Temp 98.0°F | Ht 69.0 in | Wt 266.4 lb

## 2024-01-06 DIAGNOSIS — K219 Gastro-esophageal reflux disease without esophagitis: Secondary | ICD-10-CM

## 2024-01-06 DIAGNOSIS — K580 Irritable bowel syndrome with diarrhea: Secondary | ICD-10-CM

## 2024-01-06 DIAGNOSIS — Z1211 Encounter for screening for malignant neoplasm of colon: Secondary | ICD-10-CM

## 2024-01-06 DIAGNOSIS — K589 Irritable bowel syndrome without diarrhea: Secondary | ICD-10-CM

## 2024-01-06 MED ORDER — PANTOPRAZOLE SODIUM 20 MG PO TBEC: 20.0000 mg | DELAYED_RELEASE_TABLET | Freq: Every day | ORAL | 1 refills | Status: AC

## 2024-01-06 NOTE — Patient Instructions (Signed)
 Continue pantoprazole  20 mg once daily. If you are still requiring frequent medication within the next year we need to consider screening for Barrett's esophagus which is a condition that causes cells in the esophagus to look more like intestinal cells and puts you at a higher risk of esophageal  cancer.  Will we need medications called PPIs (like pantoprazole ) long-term and continued to have reflux symptoms this is something that we screen for by doing an upper endoscopy.  Follow a GERD diet:  Avoid fried, fatty, greasy, spicy, citrus foods. Avoid caffeine and carbonated beverages. Avoid chocolate. Try eating 4-6 small meals a day rather than 3 large meals. Do not eat within 3 hours of laying down. Prop head of bed up on wood or bricks to create a 6 inch incline.  To ensure we do colon cancer screening I will go ahead and order Cologuard for you but you should wait until after your birthday to perform the test and send it off.  You can wait and do it until January 1 if you would like.  Plan to follow-up in 1 year, sooner if needed.  It was a pleasure to see you today. I want to create trusting relationships with patients. If you receive a survey regarding your visit,  I greatly appreciate you taking time to fill this out on paper or through your MyChart. I value your feedback.  Charmaine Melia, MSN, FNP-BC, AGACNP-BC [facility]

## 2024-02-20 LAB — COLOGUARD: COLOGUARD: NEGATIVE

## 2024-02-24 ENCOUNTER — Ambulatory Visit: Payer: Self-pay | Admitting: Gastroenterology

## 2024-03-11 ENCOUNTER — Ambulatory Visit: Payer: Medicare Other
